# Patient Record
Sex: Female | Born: 1965 | Race: White | Hispanic: No | Marital: Married | Smoking: Never smoker
Health system: Southern US, Community
[De-identification: ages and names within clinical notes are randomized; demographics above are authoritative.]

## PROBLEM LIST (undated history)

## (undated) DIAGNOSIS — R7309 Other abnormal glucose: Secondary | ICD-10-CM

## (undated) DIAGNOSIS — R7989 Other specified abnormal findings of blood chemistry: Secondary | ICD-10-CM

## (undated) DIAGNOSIS — F329 Major depressive disorder, single episode, unspecified: Secondary | ICD-10-CM

## (undated) DIAGNOSIS — E785 Hyperlipidemia, unspecified: Secondary | ICD-10-CM

## (undated) DIAGNOSIS — T7840XA Allergy, unspecified, initial encounter: Secondary | ICD-10-CM

## (undated) DIAGNOSIS — N200 Calculus of kidney: Secondary | ICD-10-CM

## (undated) DIAGNOSIS — F419 Anxiety disorder, unspecified: Secondary | ICD-10-CM

## (undated) DIAGNOSIS — K219 Gastro-esophageal reflux disease without esophagitis: Secondary | ICD-10-CM

## (undated) DIAGNOSIS — F32A Depression, unspecified: Secondary | ICD-10-CM

## (undated) DIAGNOSIS — N210 Calculus in bladder: Secondary | ICD-10-CM

## (undated) DIAGNOSIS — L709 Acne, unspecified: Secondary | ICD-10-CM

## (undated) HISTORY — DX: Hyperlipidemia, unspecified: E78.5

## (undated) HISTORY — DX: Other specified abnormal findings of blood chemistry: R79.89

## (undated) HISTORY — DX: Calculus in bladder: N21.0

## (undated) HISTORY — PX: OTHER SURGICAL HISTORY: SHX169

## (undated) HISTORY — DX: Allergy, unspecified, initial encounter: T78.40XA

## (undated) HISTORY — DX: Acne, unspecified: L70.9

## (undated) HISTORY — DX: Other abnormal glucose: R73.09

## (undated) HISTORY — DX: Depression, unspecified: F32.A

## (undated) HISTORY — PX: CYST REMOVAL HAND: SHX6279

## (undated) HISTORY — DX: Anxiety disorder, unspecified: F41.9

## (undated) HISTORY — DX: Major depressive disorder, single episode, unspecified: F32.9

## (undated) HISTORY — PX: APPENDECTOMY: SHX54

## (undated) HISTORY — DX: Calculus of kidney: N20.0

---

## 1999-02-15 ENCOUNTER — Other Ambulatory Visit: Admission: RE | Admit: 1999-02-15 | Discharge: 1999-02-15 | Payer: Self-pay | Admitting: *Deleted

## 2001-02-20 ENCOUNTER — Other Ambulatory Visit: Admission: RE | Admit: 2001-02-20 | Discharge: 2001-02-20 | Payer: Self-pay | Admitting: *Deleted

## 2002-06-28 ENCOUNTER — Other Ambulatory Visit: Admission: RE | Admit: 2002-06-28 | Discharge: 2002-06-28 | Payer: Self-pay | Admitting: Family Medicine

## 2003-06-30 ENCOUNTER — Other Ambulatory Visit: Admission: RE | Admit: 2003-06-30 | Discharge: 2003-06-30 | Payer: Self-pay | Admitting: Family Medicine

## 2003-11-23 ENCOUNTER — Encounter (INDEPENDENT_AMBULATORY_CARE_PROVIDER_SITE_OTHER): Payer: Self-pay | Admitting: Internal Medicine

## 2003-11-23 ENCOUNTER — Other Ambulatory Visit: Admission: RE | Admit: 2003-11-23 | Discharge: 2003-11-23 | Payer: Self-pay | Admitting: Family Medicine

## 2004-06-22 ENCOUNTER — Emergency Department: Payer: Self-pay | Admitting: Emergency Medicine

## 2004-07-31 ENCOUNTER — Ambulatory Visit: Payer: Self-pay | Admitting: Family Medicine

## 2004-08-10 ENCOUNTER — Ambulatory Visit: Payer: Self-pay | Admitting: Family Medicine

## 2005-03-06 ENCOUNTER — Ambulatory Visit: Payer: Self-pay | Admitting: Family Medicine

## 2005-03-14 ENCOUNTER — Ambulatory Visit: Payer: Self-pay | Admitting: Family Medicine

## 2005-05-14 ENCOUNTER — Ambulatory Visit: Payer: Self-pay | Admitting: Family Medicine

## 2005-05-27 ENCOUNTER — Ambulatory Visit: Payer: Self-pay | Admitting: Family Medicine

## 2005-08-20 ENCOUNTER — Ambulatory Visit: Payer: Self-pay | Admitting: Obstetrics and Gynecology

## 2006-04-11 ENCOUNTER — Ambulatory Visit: Payer: Self-pay | Admitting: Family Medicine

## 2006-05-01 ENCOUNTER — Ambulatory Visit: Payer: Self-pay | Admitting: Family Medicine

## 2006-06-10 ENCOUNTER — Emergency Department (HOSPITAL_COMMUNITY): Admission: EM | Admit: 2006-06-10 | Discharge: 2006-06-10 | Payer: Self-pay | Admitting: *Deleted

## 2006-09-25 ENCOUNTER — Ambulatory Visit: Payer: Self-pay | Admitting: Obstetrics and Gynecology

## 2006-10-13 ENCOUNTER — Encounter (INDEPENDENT_AMBULATORY_CARE_PROVIDER_SITE_OTHER): Payer: Self-pay | Admitting: Internal Medicine

## 2006-10-13 DIAGNOSIS — L708 Other acne: Secondary | ICD-10-CM | POA: Insufficient documentation

## 2006-10-13 DIAGNOSIS — N39498 Other specified urinary incontinence: Secondary | ICD-10-CM | POA: Insufficient documentation

## 2006-10-13 DIAGNOSIS — O99810 Abnormal glucose complicating pregnancy: Secondary | ICD-10-CM | POA: Insufficient documentation

## 2006-10-13 DIAGNOSIS — F411 Generalized anxiety disorder: Secondary | ICD-10-CM | POA: Insufficient documentation

## 2006-10-13 DIAGNOSIS — J309 Allergic rhinitis, unspecified: Secondary | ICD-10-CM | POA: Insufficient documentation

## 2006-10-14 ENCOUNTER — Ambulatory Visit: Payer: Self-pay | Admitting: Family Medicine

## 2006-10-14 ENCOUNTER — Ambulatory Visit: Payer: Self-pay | Admitting: Gastroenterology

## 2006-10-14 DIAGNOSIS — E7800 Pure hypercholesterolemia, unspecified: Secondary | ICD-10-CM | POA: Insufficient documentation

## 2006-10-17 ENCOUNTER — Ambulatory Visit: Payer: Self-pay | Admitting: Family Medicine

## 2006-10-23 LAB — CONVERTED CEMR LAB
LDL Cholesterol: 127 mg/dL — ABNORMAL HIGH (ref 0–99)
Total CHOL/HDL Ratio: 4.7
Triglycerides: 77 mg/dL (ref 0–149)
VLDL: 15 mg/dL (ref 0–40)

## 2007-03-23 ENCOUNTER — Ambulatory Visit: Payer: Self-pay | Admitting: Family Medicine

## 2007-03-23 DIAGNOSIS — K219 Gastro-esophageal reflux disease without esophagitis: Secondary | ICD-10-CM | POA: Insufficient documentation

## 2007-05-07 DIAGNOSIS — C4490 Unspecified malignant neoplasm of skin, unspecified: Secondary | ICD-10-CM

## 2007-05-07 HISTORY — DX: Unspecified malignant neoplasm of skin, unspecified: C44.90

## 2007-06-12 ENCOUNTER — Ambulatory Visit: Payer: Self-pay | Admitting: Family Medicine

## 2007-06-12 LAB — CONVERTED CEMR LAB
HDL: 35.9 mg/dL — ABNORMAL LOW (ref >39.0)
Triglycerides: 102 mg/dL (ref 0–149)

## 2007-06-15 LAB — CONVERTED CEMR LAB
Cholesterol: 162 mg/dL (ref 0–200)
HDL: 35.9 mg/dL — ABNORMAL LOW (ref >39.0)
LDL Cholesterol: 106 mg/dL — ABNORMAL HIGH (ref 0–99)
Triglycerides: 102 mg/dL (ref 0–149)

## 2007-07-05 HISTORY — PX: BLADDER SURGERY: SHX569

## 2007-08-04 ENCOUNTER — Ambulatory Visit (HOSPITAL_BASED_OUTPATIENT_CLINIC_OR_DEPARTMENT_OTHER): Admission: RE | Admit: 2007-08-04 | Discharge: 2007-08-04 | Payer: Self-pay | Admitting: Urology

## 2007-08-04 HISTORY — PX: INCONTINENCE SURGERY: SHX676

## 2007-10-06 ENCOUNTER — Ambulatory Visit (HOSPITAL_COMMUNITY): Admission: RE | Admit: 2007-10-06 | Discharge: 2007-10-06 | Payer: Self-pay | Admitting: Obstetrics & Gynecology

## 2007-12-07 ENCOUNTER — Ambulatory Visit: Payer: Self-pay | Admitting: Family Medicine

## 2007-12-07 DIAGNOSIS — L570 Actinic keratosis: Secondary | ICD-10-CM | POA: Insufficient documentation

## 2007-12-07 LAB — CONVERTED CEMR LAB
Cholesterol, target level: 200 mg/dL
HDL goal, serum: 40 mg/dL

## 2007-12-08 LAB — CONVERTED CEMR LAB
HDL: 38 mg/dL — ABNORMAL LOW (ref >39.0)
LDL Cholesterol: 118 mg/dL — ABNORMAL HIGH (ref 0–99)
Total CHOL/HDL Ratio: 4.5
VLDL: 14 mg/dL (ref 0–40)

## 2007-12-28 ENCOUNTER — Telehealth (INDEPENDENT_AMBULATORY_CARE_PROVIDER_SITE_OTHER): Payer: Self-pay | Admitting: *Deleted

## 2008-06-16 ENCOUNTER — Ambulatory Visit: Payer: Self-pay | Admitting: Family Medicine

## 2008-06-17 ENCOUNTER — Ambulatory Visit: Payer: Self-pay | Admitting: Gastroenterology

## 2008-06-17 ENCOUNTER — Telehealth (INDEPENDENT_AMBULATORY_CARE_PROVIDER_SITE_OTHER): Payer: Self-pay | Admitting: *Deleted

## 2008-06-17 ENCOUNTER — Telehealth (INDEPENDENT_AMBULATORY_CARE_PROVIDER_SITE_OTHER): Payer: Self-pay | Admitting: Internal Medicine

## 2008-06-20 ENCOUNTER — Encounter (INDEPENDENT_AMBULATORY_CARE_PROVIDER_SITE_OTHER): Payer: Self-pay | Admitting: *Deleted

## 2008-06-21 ENCOUNTER — Telehealth: Payer: Self-pay | Admitting: Gastroenterology

## 2008-06-23 ENCOUNTER — Ambulatory Visit: Payer: Self-pay | Admitting: Gastroenterology

## 2008-06-23 ENCOUNTER — Ambulatory Visit (HOSPITAL_COMMUNITY): Admission: RE | Admit: 2008-06-23 | Discharge: 2008-06-23 | Payer: Self-pay | Admitting: Gastroenterology

## 2008-06-23 ENCOUNTER — Encounter: Payer: Self-pay | Admitting: Gastroenterology

## 2008-06-27 ENCOUNTER — Telehealth: Payer: Self-pay | Admitting: Gastroenterology

## 2008-06-27 ENCOUNTER — Ambulatory Visit: Payer: Self-pay | Admitting: Family Medicine

## 2008-06-29 ENCOUNTER — Encounter: Payer: Self-pay | Admitting: Gastroenterology

## 2008-06-29 ENCOUNTER — Telehealth (INDEPENDENT_AMBULATORY_CARE_PROVIDER_SITE_OTHER): Payer: Self-pay | Admitting: *Deleted

## 2008-06-29 LAB — CONVERTED CEMR LAB
Cholesterol: 171 mg/dL (ref 0–200)
HDL: 36.6 mg/dL — ABNORMAL LOW (ref >39.0)
Total CHOL/HDL Ratio: 4.7
Triglycerides: 63 mg/dL (ref 0–149)
VLDL: 13 mg/dL (ref 0–40)

## 2008-07-07 ENCOUNTER — Ambulatory Visit (HOSPITAL_BASED_OUTPATIENT_CLINIC_OR_DEPARTMENT_OTHER): Admission: RE | Admit: 2008-07-07 | Discharge: 2008-07-07 | Payer: Self-pay | Admitting: Urology

## 2008-07-07 HISTORY — PX: COLLAGEN INJECTION: SHX5354

## 2008-10-07 ENCOUNTER — Ambulatory Visit (HOSPITAL_COMMUNITY): Admission: RE | Admit: 2008-10-07 | Discharge: 2008-10-07 | Payer: Self-pay | Admitting: Obstetrics & Gynecology

## 2008-12-29 ENCOUNTER — Telehealth: Payer: Self-pay | Admitting: Family Medicine

## 2009-04-14 ENCOUNTER — Ambulatory Visit: Payer: Self-pay | Admitting: Family Medicine

## 2009-06-26 ENCOUNTER — Ambulatory Visit: Payer: Self-pay | Admitting: Family Medicine

## 2009-06-27 LAB — CONVERTED CEMR LAB
Albumin: 3.6 g/dL (ref 3.5–5.2)
BUN: 13 mg/dL (ref 6–23)
Basophils Absolute: 0 10*3/uL (ref 0.0–0.1)
Bilirubin, Direct: 0 mg/dL (ref 0.0–0.3)
CO2: 29 meq/L (ref 19–32)
Calcium: 9 mg/dL (ref 8.4–10.5)
Chloride: 112 meq/L (ref 96–112)
Eosinophils Relative: 1.7 % (ref 0.0–5.0)
Glucose, Bld: 94 mg/dL (ref 70–99)
Hemoglobin: 12.5 g/dL (ref 12.0–15.0)
Lymphocytes Relative: 24.2 % (ref 12.0–46.0)
Lymphs Abs: 1.5 10*3/uL (ref 0.7–4.0)
MCV: 95.2 fL (ref 78.0–100.0)
Monocytes Absolute: 0.5 10*3/uL (ref 0.1–1.0)
Monocytes Relative: 9 % (ref 3.0–12.0)
Neutro Abs: 3.9 10*3/uL (ref 1.4–7.7)
Neutrophils Relative %: 64.8 % (ref 43.0–77.0)
RBC: 3.89 M/uL (ref 3.87–5.11)
RDW: 11.8 % (ref 11.5–14.6)
Sodium: 144 meq/L (ref 135–145)
TSH: 0.74 microintl units/mL (ref 0.35–5.50)
Total Bilirubin: 0.3 mg/dL (ref 0.3–1.2)

## 2009-07-11 ENCOUNTER — Encounter (INDEPENDENT_AMBULATORY_CARE_PROVIDER_SITE_OTHER): Payer: Self-pay | Admitting: Obstetrics and Gynecology

## 2009-07-11 ENCOUNTER — Inpatient Hospital Stay (HOSPITAL_COMMUNITY): Admission: RE | Admit: 2009-07-11 | Discharge: 2009-07-12 | Payer: Self-pay | Admitting: Obstetrics and Gynecology

## 2009-07-11 HISTORY — PX: ABDOMINAL HYSTERECTOMY: SHX81

## 2009-09-29 ENCOUNTER — Encounter: Admission: RE | Admit: 2009-09-29 | Discharge: 2009-09-29 | Payer: Self-pay | Admitting: Obstetrics and Gynecology

## 2010-02-07 ENCOUNTER — Telehealth: Payer: Self-pay | Admitting: Family Medicine

## 2010-02-07 ENCOUNTER — Ambulatory Visit: Payer: Self-pay | Admitting: Family Medicine

## 2010-02-09 LAB — CONVERTED CEMR LAB
Albumin: 4.2 g/dL (ref 3.5–5.2)
BUN: 14 mg/dL (ref 6–23)
Basophils Absolute: 0 10*3/uL (ref 0.0–0.1)
Basophils Relative: 0.6 % (ref 0.0–3.0)
CO2: 26 meq/L (ref 19–32)
Chloride: 108 meq/L (ref 96–112)
Creatinine, Ser: 0.7 mg/dL (ref 0.4–1.2)
Eosinophils Absolute: 0.1 10*3/uL (ref 0.0–0.7)
Eosinophils Relative: 1.8 % (ref 0.0–5.0)
Glucose, Bld: 92 mg/dL (ref 70–99)
LDL Cholesterol: 123 mg/dL — ABNORMAL HIGH (ref 0–99)
Lymphocytes Relative: 23.3 % (ref 12.0–46.0)
Lymphs Abs: 1.6 10*3/uL (ref 0.7–4.0)
Monocytes Absolute: 0.6 10*3/uL (ref 0.1–1.0)
Monocytes Relative: 8.2 % (ref 3.0–12.0)
Neutro Abs: 4.6 10*3/uL (ref 1.4–7.7)
Neutrophils Relative %: 66.1 % (ref 43.0–77.0)
Potassium: 4.2 meq/L (ref 3.5–5.1)
RBC: 4.08 M/uL (ref 3.87–5.11)
RDW: 12.9 % (ref 11.5–14.6)
TSH: 0.5 microintl units/mL (ref 0.35–5.50)
Total Bilirubin: 0.6 mg/dL (ref 0.3–1.2)
Total CHOL/HDL Ratio: 4
Total Protein: 6.9 g/dL (ref 6.0–8.3)

## 2010-04-10 ENCOUNTER — Encounter: Payer: Self-pay | Admitting: Family Medicine

## 2010-05-14 ENCOUNTER — Ambulatory Visit
Admission: RE | Admit: 2010-05-14 | Discharge: 2010-05-14 | Payer: Self-pay | Source: Home / Self Care | Attending: Family Medicine | Admitting: Family Medicine

## 2010-06-07 NOTE — Letter (Signed)
Summary: [facility]   Imported By: Maryln Gottron 04/16/2010 13:40:28  _____________________________________________________________________  External Attachment:    Type:   Image     Comment:   External Document

## 2010-06-07 NOTE — Assessment & Plan Note (Signed)
Summary: FOLLOW UP   Vital Signs:  Patient profile:   45 year old female Height:      62.5 inches Weight:      131.75 pounds BMI:     23.80 Temp:     98.0 degrees F oral Pulse rate:   60 / minute Pulse rhythm:   regular BP sitting:   90 / 64  (left arm) Cuff size:   regular  Vitals Entered By: Linde Gillis CMA Duncan Dull) (June 26, 2009 8:36 AM) CC: follow-up visit, Back Pain   History of Present Illness: is having rectocele and bladder sling and hysterectomy -- nothing cancerous - just for bladder control plans to keep ovaries in if possible  Dr Shon Baton - silva -- is march 8th   is really uncomfortable and also bladder control issues  is ready to get it done   is a little tired in general  cannot exercise because of above  is keeping wt doen by cutting back on what she is eating   wants to have her blood work checked -- sugar, cholesterol , and thyroid since she is tired   is back on the prozac - that is working well  did have anxiety when she weaned off of it  is generally anxious -- with overall stressed around it  is not depressed at all  will be better when she can exercise   painful periods- on OC -for one month -- then will go off of them       Allergies: 1)  Cipro (Ciprofloxacin Hcl)  Past History:  Past Medical History: Last updated: 06/17/2008 Allergic rhinitis Anxiety hyperlipidemia AKs acne  family hx of DM   Family History: Last updated: 06/17/2008 several family members with DM 2 Family History of Heart Disease:   Social History: Last updated: 06/17/2008 Marital Status: Married Children: Son 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 Eyes:  Denies blurring and eye pain. CV:  Denies chest pain or discomfort and lightheadness. Resp:  Denies cough and pleuritic. GI:  Denies abdominal pain, bloody stools, and change in bowel habits. MS:  Denies joint pain, joint redness, and joint swelling. Derm:  Denies lesion(s), poor wound healing, and rash. Neuro:  Denies numbness and tingling. Psych:  Complains of anxiety; denies panic attacks. Endo:  Denies excessive thirst and excessive urination. Heme:  Denies abnormal bruising and bleeding.  Physical Exam  General:  Well-developed,well-nourished,in no acute distress; alert,appropriate and cooperative throughout examination Head:  normocephalic, atraumatic, and no abnormalities observed.   Eyes:  vision grossly intact, pupils equal, pupils round, and pupils reactive to light.  no conjunctival pallor, injection or icterus  Mouth:  pharynx pink and moist.   Neck:  supple with full rom and no masses or thyromegally, no JVD or carotid bruit  Lungs:  Normal respiratory effort, chest expands symmetrically. Lungs are clear to auscultation, no crackles or wheezes. Heart:  Normal rate and regular rhythm. S1 and S2 normal without gallop, murmur, click, rub or other extra sounds. Abdomen:  soft, non-tender, and normal bowel sounds.   Extremities:  No clubbing, cyanosis, edema, or deformity noted with normal full range of motion of all joints.   Neurologic:  sensation intact to light touch,  gait normal, and DTRs symmetrical and normal.   Skin:  Intact without suspicious lesions or rashes Cervical Nodes:  No lymphadenopathy noted Psych:  normal affect, talkative and pleasant    Impression & Recommendations:  Problem # 1:  FATIGUE (ICD-780.79) Assessment New suspect this is multifactorial with painful menses and upcoming surg/ inability to exercise and work stress lab today and update  disc good health habits  Orders: Venipuncture (09811) TLB-Lipid Panel  (80061-LIPID) TLB-BMP (Basic Metabolic Panel-BMET) (80048-METABOL) TLB-CBC Platelet - w/Differential (85025-CBCD) TLB-Hepatic/Liver Function Pnl (80076-HEPATIC) TLB-TSH (Thyroid Stimulating Hormone) (84443-TSH)  Problem # 2:  HYPERCHOLESTEROLEMIA, PURE (ICD-272.0) Assessment: Unchanged  check lipids today- suspect well controlled with good diet does avoid sat fats - reviewed  Orders: Venipuncture (91478) TLB-Lipid Panel (80061-LIPID) TLB-BMP (Basic Metabolic Panel-BMET) (80048-METABOL) TLB-CBC Platelet - w/Differential (85025-CBCD) TLB-Hepatic/Liver Function Pnl (80076-HEPATIC) TLB-TSH (Thyroid Stimulating Hormone) (84443-TSH)  Labs Reviewed: SGOT: 20 (12/07/2007)   SGPT: 15 (12/07/2007)  Lipid Goals: Chol Goal: 200 (12/07/2007)   HDL Goal: 40 (12/07/2007)   LDL Goal: 160 (12/07/2007)   TG Goal: 150 (12/07/2007)  Prior 10 Yr Risk Heart Disease: 2 % (12/07/2007)   HDL:36.6 (06/27/2008), 38.0 (12/07/2007)  LDL:122 (06/27/2008), 118 (12/07/2007)  Chol:171 (06/27/2008), 170 (12/07/2007)  Trig:63 (06/27/2008), 68 (12/07/2007)  Problem # 3:  FAMILY HISTORY OF DIABETES MELLITUS (ICD-V18.0) Assessment: Unchanged will check fasting glucose today pt does good job with low sugar diet and wt mt  Orders: Venipuncture (29562) TLB-Lipid Panel (80061-LIPID) TLB-BMP (Basic Metabolic Panel-BMET) (80048-METABOL) TLB-CBC Platelet - w/Differential (85025-CBCD) TLB-Hepatic/Liver Function Pnl (80076-HEPATIC) TLB-TSH (Thyroid Stimulating Hormone) (84443-TSH)  Problem # 4:  ANXIETY (ICD-300.00) Assessment: Unchanged  fairly stable  do not recommend weaning off prozac until surgery is over  did refil med  Her updated medication list for this problem includes:    Prozac Weekly 90 Mg Cpdr (Fluoxetine hcl) .Marland Kitchen... Take 2 by mouth weekly  Orders: Prescription Created Electronically ([phone])  Complete Medication List: 1)  Multivitamins Tabs (Multiple vitamin) .... Take one by mouth daily 2)   Prozac Weekly 90 Mg Cpdr (Fluoxetine hcl) .... Take 2 by mouth weekly  Patient Instructions: 1)  labs today for cholesterol and blood sugar and fatigue  2)  no change in medicines  3)  good luck with your surgery  4)  get back to exercise when you can  Prescriptions: PROZAC WEEKLY 90 MG CPDR (FLUOXETINE HCL) Take 2 by mouth weekly  #8 x 11   Entered and Authorized by:   Judith Part MD   Signed by:   Judith Part MD on 06/26/2009   Method used:   [facility]        [id number]       [address]       [place], [place]  [address]       Ph: [phone]       Fax: [phone]   RxID:   [id number]   Prior Medications: MULTIVITAMINS  TABS (MULTIPLE VITAMIN) Take one by mouth daily PROZAC WEEKLY 90 MG CPDR (FLUOXETINE HCL) Take 2 by mouth weekly Current Allergies: CIPRO (CIPROFLOXACIN HCL)

## 2010-06-07 NOTE — Assessment & Plan Note (Signed)
Summary: FOLLOW UP / LFW   Vital Signs:  Patient profile:   45 year old female Height:      62.5 inches Weight:      131.75 pounds BMI:     23.80 Temp:     98.2 degrees F oral Pulse rate:   64 / minute Pulse rhythm:   regular BP sitting:   90 / 62  (left arm) Cuff size:   regular  Vitals Entered By: Lewanda Rife LPN (May 14, 2010 9:00 AM) CC: three month f/u   History of Present Illness: here for f/u of anxiety last visit switched prozac to celexa  is doing well overall   the celexa has improved the anxiety  is feeling more like her normal self -- just needed a switch  is also coping better at work  has some good coworkers- had good support  good changes at work   has been to the urgent care with sinus problems  took a course of amox much better - but staying congested with post nasal drip  some headaches - pressure   wt is down 1 lb  concerned about chol wants to rev labs tries to avoid red meat  eats too much [place] food     Allergies: 1)  Cipro (Ciprofloxacin Hcl)  Past History:  Past Medical History: Last updated: 06/17/2008 Allergic rhinitis Anxiety hyperlipidemia AKs acne  family hx of DM   Past Surgical History: Last updated: 02/07/2010 Appendectomy  bladder surgery March 2009 bladder sling  partial hyst -- cystocele and rectocele 2nd bladder tach  Family History: Last updated: 06/17/2008 several family members with DM 2 Family History of Heart Disease:   Social History: Last updated: 06/17/2008 Marital Status: Married Children: Son 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 Eyes:  Denies blurring and eye irritation. CV:  Denies chest pain or discomfort and palpitations. Resp:  Denies cough and wheezing. GI:   Denies indigestion and nausea. MS:  Denies joint pain, joint redness, and joint swelling. Derm:  Denies itching, lesion(s), poor wound healing, and rash. Neuro:  Denies numbness and tingling. Psych:  Denies panic attacks and suicidal thoughts/plans; mood is better . Endo:  Denies cold intolerance, excessive thirst, excessive urination, and heat intolerance. Heme:  Denies abnormal bruising and bleeding.  Physical Exam  General:  Well-developed,well-nourished,in no acute distress; alert,appropriate and cooperative throughout examination Head:  normocephalic, atraumatic, and no abnormalities observed.  no sinus tenderness Eyes:  vision grossly intact, pupils equal, pupils round, pupils reactive to light, and no injection.   Ears:  R ear normal and L ear normal.   Nose:  nares boggy and pale Mouth:  pharynx pink and moist, no erythema, and no exudates.   Neck:  supple with full rom and no masses or thyromegally, no JVD or carotid bruit  Chest Wall:  No deformities, masses, or tenderness noted. Lungs:  Normal respiratory effort, chest expands symmetrically. Lungs are clear to auscultation, no crackles or wheezes. Extremities:  No clubbing, cyanosis, edema, or deformity noted with normal full range of motion of all joints.   Neurologic:  sensation intact to light touch, gait normal, and DTRs symmetrical and normal.   no tremor  Skin:  Intact without suspicious lesions or rashes Cervical Nodes:  No lymphadenopathy noted Psych:  normal affect, talkative  and pleasant    Impression & Recommendations:  Problem # 1:  ANXIETY (ICD-300.00) Assessment Improved this is improved with celexa and also better support at work  plan to continue this  update if changes  Her updated medication list for this problem includes:    Celexa 40 Mg Tabs (Citalopram hydrobromide) .Marland Kitchen... 1 by mouth once daily  Problem # 2:  HYPERCHOLESTEROLEMIA, PURE (ICD-272.0) Assessment: Deteriorated  reviewed her lipid  profile in detail  LDL up a bit / HDL down  no med needed at this time disc chol goals disc low sat fat diet  disc exercise 5 d per week will re check next fall  Labs Reviewed: SGOT: 20 (02/07/2010)   SGPT: 17 (02/07/2010)  Lipid Goals: Chol Goal: 200 (12/07/2007)   HDL Goal: 40 (12/07/2007)   LDL Goal: 160 (12/07/2007)   TG Goal: 150 (12/07/2007)  Prior 10 Yr Risk Heart Disease: 2 % (12/07/2007)   HDL:45.30 (02/07/2010), 53.40 (06/26/2009)  LDL:123 (02/07/2010), 110 (06/26/2009)  Chol:190 (02/07/2010), 176 (06/26/2009)  Trig:107.0 (02/07/2010), 64.0 (06/26/2009)  Problem # 3:  ALLERGIC RHINITIS (ICD-477.9) Assessment: Deteriorated  worse in winter ? poss dust/ mold exp  trial of flonase and update  Her updated medication list for this problem includes:    Flonase 50 Mcg/act Susp (Fluticasone propionate) .Marland Kitchen... 2 sprays in each nostril once daily  Discussed use of allergy medications and environmental measures.   Orders: Prescription Created Electronically ([phone])  Complete Medication List: 1)  Multivitamins Tabs (Multiple vitamin) .... Take one by mouth daily 2)  Celexa 40 Mg Tabs (Citalopram hydrobromide) .Marland Kitchen.. 1 by mouth once daily 3)  Flonase 50 Mcg/act Susp (Fluticasone propionate) .... 2 sprays in each nostril once daily  Patient Instructions: 1)  you can raise your HDL (good cholesterol) by increasing exercise and eating omega 3 fatty acid supplement like fish oil or flax seed oil over the counter 2)  you can lower LDL (bad cholesterol) by limiting saturated fats in diet like red meat, fried foods, egg yolks, fatty breakfast meats, high fat dairy products and shellfish  3)  continue the celexa- I'm glad it is helping  4)  It is important that you exercise reguarly at least 20 minutes 5 times a week. If you develop chest pain, have severe difficulty breathing, or feel very tired, stop exercising immediately and seek medical attention.  5)  start flonase for chronic  congestion - let me know if not improving  Prescriptions: FLONASE 50 MCG/ACT SUSP (FLUTICASONE PROPIONATE) 2 sprays in each nostril once daily  #1 mdi x 11   Entered and Authorized by:   Judith Part MD   Signed by:   Judith Part MD on 05/14/2010   Method used:   [facility]        [id number]       [address]       [place], [place]  [address]       Ph: [phone]       Fax: [phone]   RxID:   [id number]    Orders Added: 1)  Est. Patient Level IV [02725] 2)  Prescription Created Electronically [phone]    Current Allergies (reviewed today): CIPRO (CIPROFLOXACIN HCL)

## 2010-06-07 NOTE — Progress Notes (Signed)
Summary: prior Berkley Harvey is needed for lexapro  Phone Note From Pharmacy   [facility]  [address] [id number] Summary of Call: Prior Berkley Harvey is needed for lexapro- insurance prefers citalopram, gen prozac, sertraline, gen paxil.  Do you want to change, or I can get prior auth form. Initial call taken by: Lowella Petties CMA,  February 07, 2010 4:26 PM  Follow-up for Phone Call        wil go with the celexa-- let the pt know I'm ok with that px written on EMR for call in  Follow-up by: Judith Part MD,  February 07, 2010 5:08 PM  Additional Follow-up for Phone Call Additional follow up Details #1::        Medication Celexa  [facility] as instructed. Patient notified as instructed by telephone. Lewanda Rife LPN  February 07, 2010 5:32 PM

## 2010-06-07 NOTE — Assessment & Plan Note (Signed)
Summary: CPX/CLE   Vital Signs:  Patient profile:   45 year old female Height:      62.5 inches Weight:      132 pounds BMI:     23.84 Temp:     97.8 degrees F oral Pulse rate:   60 / minute Pulse rhythm:   regular BP sitting:   98 / 64  (left arm) Cuff size:   regular  Vitals Entered By: Lewanda Rife LPN (February 07, 2010 9:53 AM) CC: CPX GYN does pap and breast exam. 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    wt is up 1 lb with good bmi of 23  bp good at 98/64- baseline   lipids are well controlled with LDL 110 (imp from 120s and also HDL up to 53) that was in feb  wants labs in may  has been on prozac for 10 years weekly  is starting to get worse anx again- ruminates over things and gets fixated  is not exercising -- knows that is important for anx    other labs nl   gyn care--march  had hyst in march -- bladder was prolapsing and also rectocele 2nd bladder tack  mam - april was nl with a cyst  loves diet coke- too much caffiene    Td 05 flu shot - had today   Allergies: 1)  Cipro (Ciprofloxacin Hcl)  Past History:  Past Medical History: Last updated: 06/17/2008 Allergic rhinitis Anxiety hyperlipidemia AKs acne  family hx of DM   Family History: Last updated: 06/17/2008 several family members with DM 2 Family History of Heart Disease:   Social History: Last updated: 06/17/2008 Marital Status: Married Children: Son 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 Eyes:  Denies blurring and eye irritation. CV:  Denies chest pain or discomfort, lightheadness, and palpitations. Resp:  Denies cough, shortness of breath, and wheezing. GI:  Denies abdominal pain, bloody stools, change in bowel habits, and indigestion. GU:  Denies abnormal vaginal bleeding, discharge, and dysuria. MS:  Denies joint pain. Derm:  Denies itching, lesion(s), poor wound healing, and rash. Neuro:  Denies numbness and tingling. Endo:  Denies excessive thirst and excessive urination. Heme:  Denies abnormal bruising and bleeding.  Physical Exam  General:  Well-developed,well-nourished,in no acute distress; alert,appropriate and cooperative throughout examination Head:  normocephalic, atraumatic, and no abnormalities observed.   Eyes:  vision grossly intact, pupils equal, pupils round, and pupils reactive to light.  no conjunctival pallor, injection or icterus  Ears:  R ear normal and L ear normal.   Nose:  no nasal discharge.   Mouth:  pharynx pink and moist.   Neck:  supple with full rom and no masses or thyromegally, no JVD or carotid bruit  Chest Wall:  No deformities, masses, or tenderness noted. Lungs:  Normal respiratory effort, chest expands symmetrically. Lungs are clear to auscultation, no crackles or wheezes. Heart:  Normal rate and regular rhythm. S1 and S2 normal without gallop, murmur, click, rub or other extra sounds.  Abdomen:  Bowel sounds positive,abdomen soft and non-tender without masses, organomegaly or hernias noted. no renal bruit  Msk:  No deformity or scoliosis noted of thoracic or lumbar spine.  no acute joint changes  Pulses:  R and L carotid,radial,femoral,dorsalis pedis and posterior tibial pulses are full and equal bilaterally Extremities:  No clubbing, cyanosis, edema, or deformity noted with normal full range of motion of all joints.   Neurologic:  sensation intact to light touch, gait normal, and DTRs symmetrical and normal.   Skin:   Intact without suspicious lesions or rashes some lentigos  Cervical Nodes:  No lymphadenopathy noted Inguinal Nodes:  No significant adenopathy Psych:  slightly anxious , pleasant and with good eye contact    Impression & Recommendations:  Problem # 1:  HEALTH MAINTENANCE EXAM (ICD-V70.0) Assessment Comment Only reviewed health habits including diet, exercise and skin cancer prevention reviewed health maintenance list and family history lab today f/u shot today Orders: Venipuncture (04540) TLB-Lipid Panel (80061-LIPID) TLB-BMP (Basic Metabolic Panel-BMET) (80048-METABOL) TLB-CBC Platelet - w/Differential (85025-CBCD) TLB-Hepatic/Liver Function Pnl (80076-HEPATIC) TLB-TSH (Thyroid Stimulating Hormone) (84443-TSH)  Problem # 2:  HYPERCHOLESTEROLEMIA, PURE (ICD-272.0) Assessment: Unchanged  lipids have been well controlled with diet  re check today rev low sat fat diet  Orders: Venipuncture (98119) TLB-Lipid Panel (80061-LIPID) TLB-BMP (Basic Metabolic Panel-BMET) (80048-METABOL) TLB-CBC Platelet - w/Differential (85025-CBCD) TLB-Hepatic/Liver Function Pnl (80076-HEPATIC) TLB-TSH (Thyroid Stimulating Hormone) (84443-TSH)  Labs Reviewed: SGOT: 17 (06/26/2009)   SGPT: 15 (06/26/2009)  Lipid Goals: Chol Goal: 200 (12/07/2007)   HDL Goal: 40 (12/07/2007)   LDL Goal: 160 (12/07/2007)   TG Goal: 150 (12/07/2007)  Prior 10 Yr Risk Heart Disease: 2 % (12/07/2007)   HDL:53.40 (06/26/2009), 36.6 (06/27/2008)  LDL:110 (06/26/2009), 122 (06/27/2008)  Chol:176 (06/26/2009), 171 (06/27/2008)  Trig:64.0 (06/26/2009), 63 (06/27/2008)  Problem # 3:  ANXIETY (ICD-300.00) Assessment: Deteriorated will switch to lexapro or celexa  for worse anx and ? if prozac is not working as well after years wil make the change this wkend when next prozac dose is due aware to update if worse or if any side eff f/u 3 mo The following medications were removed from the medication list:    Prozac Weekly  90 Mg Cpdr (Fluoxetine hcl) .Marland Kitchen... Take 2 by mouth weekly Her updated medication list for this problem includes:    Celexa 40 Mg Tabs (Citalopram hydrobromide) .Marland Kitchen... 1 by mouth once daily  Complete Medication List: 1)  Multivitamins Tabs (Multiple vitamin) .... Take one by mouth daily 2)  Celexa 40 Mg Tabs (Citalopram hydrobromide) .Marland Kitchen.. 1 by mouth once daily  Other Orders: Admin 1st Vaccine (14782) Flu Vaccine 33yrs + (95621)  Patient Instructions: 1)  start lexapro daily (stop the prozac)  2)  update me if worse or any side effects  3)  follow up in 3 months to see how you are doing  4)  start regular exercise  5)  lab today 6)  flu shot today Prescriptions: CELEXA 40 MG TABS (CITALOPRAM HYDROBROMIDE) 1 by mouth once daily  #30 x 11   Entered and Authorized by:   Judith Part MD   Signed by:   Lewanda Rife LPN on 30/86/5784   Method used:   [facility] ...       [facility]  [address] [id number]       [address]       [place], [place]  [address]       Ph: [phone]       Fax:  [phone]   RxID:   [id number] LEXAPRO 20 MG TABS (ESCITALOPRAM OXALATE) 1 by mouth once daily in am  #30 x 11   Entered and Authorized by:   Judith Part MD   Signed by:   Judith Part MD on 02/07/2010   Method used:   [facility] to        [id number]       [address]       [place], [place]  [address]       Ph: [phone]       Fax: [phone]   RxID:   [id number]   Current Allergies (reviewed today): CIPRO (CIPROFLOXACIN HCL)  [facility]. D/C Lexapro.Patient notified as instructed by telephone. Lewanda Rife LPN  February 07, 2010 5:33 PM     Flu Vaccine Consent Questions     Do you have a history of severe allergic reactions to this vaccine? no    Any prior history of allergic reactions to egg and/or gelatin? no    Do you have a sensitivity to the preservative Thimersol? no    Do you have a  past history of Guillan-Barre Syndrome? no    Do you currently have an acute febrile illness? no    Have you ever had a severe reaction to latex? no    Vaccine information given and explained to patient? yes    Are you currently pregnant? no    Lot Number:[id number]   Exp Date:11/03/2010   Site Given  Left Deltoid IMflu Lewanda Rife LPN  February 07, 2010 9:59 AM   Preventive Care Screening  Mammogram:    Date:  08/04/2009    Results:  cyst(s) left   Pap Smear:    Date:  07/04/2009    Results:  normal

## 2010-07-11 ENCOUNTER — Encounter: Payer: Self-pay | Admitting: Family Medicine

## 2010-07-11 ENCOUNTER — Other Ambulatory Visit: Payer: Self-pay | Admitting: Family Medicine

## 2010-07-11 ENCOUNTER — Ambulatory Visit (INDEPENDENT_AMBULATORY_CARE_PROVIDER_SITE_OTHER): Payer: BC Managed Care – PPO | Admitting: Family Medicine

## 2010-07-11 ENCOUNTER — Ambulatory Visit (INDEPENDENT_AMBULATORY_CARE_PROVIDER_SITE_OTHER)
Admission: RE | Admit: 2010-07-11 | Discharge: 2010-07-11 | Disposition: A | Discharge status: Home / Self Care | Payer: BC Managed Care – PPO | Source: Ambulatory Visit | Attending: Family Medicine | Admitting: Family Medicine

## 2010-07-11 DIAGNOSIS — K209 Esophagitis, unspecified without bleeding: Secondary | ICD-10-CM

## 2010-07-11 DIAGNOSIS — K5900 Constipation, unspecified: Secondary | ICD-10-CM | POA: Insufficient documentation

## 2010-07-11 DIAGNOSIS — R1084 Generalized abdominal pain: Secondary | ICD-10-CM

## 2010-07-12 ENCOUNTER — Telehealth: Payer: Self-pay | Admitting: Family Medicine

## 2010-07-17 NOTE — Assessment & Plan Note (Signed)
Summary: IBS/CLE   BCBS   Vital Signs:  Patient profile:   45 year old female Weight:      133.75 pounds BMI:     24.16 Temp:     98.1 degrees F oral Pulse rate:   76 / minute Pulse rhythm:   regular BP sitting:   102 / 80  (left arm) Cuff size:   regular  Vitals Entered By: Selena Batten Dance CMA Duncan Dull) (July 11, 2010 12:24 PM) CC: ? IBS issues   History of Present Illness: here for IBS problems (she thinks) is having pain in her low abdomen with bloating  ? stress related was dx with IBS years ago   baseline bm every 3 days  is always a little constipated  now back and forth - between loose stool and constipation  lot of gas and bloating  most pain is right before she has bm   has not changed her diet  overall not the healthiest diet- not enough fruit and veg     pain is off and on worse after she eats and with anxiety (gets in knots with stress)  no n/v   is also having more indigestion  lots of tums  14 days of prilosec over the counter -- and that helped a lot     wt is up 2 lb  good bp  Allergies: 1)  Cipro (Ciprofloxacin Hcl)  Past History:  Past Medical History: Last updated: 06/17/2008 Allergic rhinitis Anxiety hyperlipidemia AKs acne  family hx of DM   Past Surgical History: Last updated: 02/07/2010 Appendectomy  bladder surgery March 2009 bladder sling  partial hyst -- cystocele and rectocele 2nd bladder tach  Family History: Last updated: 06/17/2008 several family members with DM 2 Family History of Heart Disease:   Social History: Last updated: 06/17/2008 Marital Status: Married Children: Son 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 Eyes:  Denies blurring and eye irritation. CV:  Denies chest pain or  discomfort, lightheadness, and palpitations. Resp:  Denies cough and wheezing. GI:  Complains of abdominal pain, change in bowel habits, constipation, gas, and indigestion; denies bloody stools, dark tarry stools, nausea, and vomiting. GU:  Denies dysuria and hematuria. MS:  Denies cramps and stiffness. Derm:  Denies itching, lesion(s), poor wound healing, and rash. Neuro:  Denies numbness and tingling. Endo:  Denies excessive thirst and excessive urination. Heme:  Denies abnormal bruising and bleeding.  Physical Exam  General:  Well-developed,well-nourished,in no acute distress; alert,appropriate and cooperative throughout examination Head:  normocephalic, atraumatic, and no abnormalities observed.   Eyes:  vision grossly intact, pupils equal, pupils round, and pupils reactive to light.  no conjunctival pallor, injection or icterus  Mouth:  pharynx pink and moist.   Neck:  supple with full rom and no masses or thyromegally, no JVD or carotid bruit  Chest Wall:  No deformities, masses, or tenderness noted. Lungs:  Normal respiratory effort, chest expands symmetrically. Lungs are clear to auscultation, no crackles or wheezes. Heart:  Normal rate and regular rhythm. S1 and S2 normal without gallop, murmur, click, rub or other extra sounds. Abdomen:  mild tenderness epigastric and periumbilical no rebound or gaurding soft, normal bowel sounds, no distention, no masses, no hepatomegaly, and no splenomegaly.   Extremities:  No clubbing, cyanosis, edema, or deformity noted with  normal full range of motion of all joints.   Neurologic:  sensation intact to light touch, gait normal, and DTRs symmetrical and normal.   Skin:  Intact without suspicious lesions or rashes Cervical Nodes:  No lymphadenopathy noted Inguinal Nodes:  No significant adenopathy Psych:  normal affect, talkative and pleasant    Impression & Recommendations:  Problem # 1:  CONSTIPATION (ICD-564.00) Assessment New chronic  - now intermittent with soft stool suspect IBS  x ray today ? if this is causing pains  trial of fiber/ water- citrucel also miralax daily for a week and report back Orders: T-Abdomen 2-view (74020TC)  Problem # 2:  ABDOMINAL PAIN, GENERALIZED (ICD-789.07) Assessment: New upper and lower suspect some from above also poss gastritis  trial of protonix and update abd film pending and then will make plan Orders: T-Abdomen 2-view (74020TC)  Problem # 3:  GERD (ICD-530.1) Assessment: Deteriorated  this is worse  added protonix daily  update in 1-2 weeks  disc better diet  stress related as well  Orders: Prescription Created Electronically [id number])  Complete Medication List: 1)  Multivitamins Tabs (Multiple vitamin) .... Take one by mouth daily 2)  Flonase 50 Mcg/act Susp (Fluticasone propionate) .... 2 sprays in each nostril once daily 3)  Celexa 40 Mg Tabs (Citalopram hydrobromide) .Marland Kitchen.. 1 by mouth once daily 4)  Protonix 40 Mg Tbec (Pantoprazole sodium) .Marland Kitchen.. 1 by mouth once daily  Patient Instructions: 1)  start citrucel once daily with water as directed  2)  start miralax once daily with water as directed - for 7 days  3)  than use miralax as needed  4)  start daily protonix in ams  5)  make effort for more fruits and vegetables 6)  x ray today  Prescriptions: PROTONIX 40 MG TBEC (PANTOPRAZOLE SODIUM) 1 by mouth once daily  #30 x 11   Entered and Authorized by:   Judith Part MD   Signed by:   Judith Part MD on 07/11/2010   Method used:   [facility]        [id number]       [address]       [place], [place]  [address]       Ph: [phone]       Fax: [phone]   RxID:   [id number]    Orders Added: 1)  T-Abdomen 2-view [74020TC] 2)  Prescription Created Electronically [[id number]] 3)  Est. Patient Level IV [66440]    Current Allergies (reviewed today): CIPRO (CIPROFLOXACIN HCL)

## 2010-07-17 NOTE — Progress Notes (Signed)
Summary: wants test results  Phone Note Call from Patient Call back at [phone]   Caller: Patient Call For: Judith Part MD Summary of Call: Pt is asking for test results from yesterday, please review and advise. Initial call taken by: Lowella Petties CMA, AAMA,  July 12, 2010 11:09 AM  Follow-up for Phone Call        Spoke with pt.Pt still has a slight pain in lower abdomen and she feels like it may be related to IBS. Pain level now is low. Pt started taking Metamucill, Citracal and Protonix today and also added fruit to her diet today. Pt said it is fine if she can get a report on abdominal xray on Friday. Dr Milinda Antis is not here tomorrow either and she said she will be traveling and not sure if she will have computer access or reception. Please advise. Lewanda Rife LPN  July 12, 6211 4:32 PM   Additional Follow-up for Phone Call Additional follow up Details #1::        may notify abd xray showing constipation so i'm glad that she is increasing fiber and miralax.  also showed possible L kidney stone, but her pain doesn ot sound like it is coming from kidney stone.  has she had in past?  I'd just recommend continue plan as set forth by Dr. Milinda Antis, encourage liberal water intake. Additional Follow-up by: Eustaquio Boyden  MD,  July 12, 2010 4:56 PM    Additional Follow-up for Phone Call Additional follow up Details #2::    Patient notified as instructed by telephone. Pt does not have hx of kidney stone. Pt will call back on Monday with update of how she feels, sooner if needed.Lewanda Rife LPN  July 12, 863 5:08 PM     I agree with above- I think constipation may be the problem but update if worse and update next week regardless Follow-up by: Judith Part MD,  July 13, 2010 6:31 AM

## 2010-07-30 LAB — CBC
HCT: 28.4 % — ABNORMAL LOW (ref 36.0–46.0)
Hemoglobin: 12.8 g/dL (ref 12.0–15.0)
MCV: 94.2 fL (ref 78.0–100.0)
Platelets: 191 10*3/uL (ref 150–400)
Platelets: 287 10*3/uL (ref 150–400)

## 2010-07-30 LAB — BASIC METABOLIC PANEL
BUN: 3 mg/dL — ABNORMAL LOW (ref 6–23)
CO2: 26 mEq/L (ref 19–32)
Calcium: 8.2 mg/dL — ABNORMAL LOW (ref 8.4–10.5)
Chloride: 106 mEq/L (ref 96–112)
GFR calc non Af Amer: >60 mL/min (ref >60)

## 2010-07-30 LAB — URINALYSIS, DIPSTICK ONLY
Bilirubin Urine: NEGATIVE
Ketones, ur: NEGATIVE mg/dL
Leukocytes, UA: NEGATIVE
Nitrite: NEGATIVE
Specific Gravity, Urine: >1.03 — ABNORMAL HIGH (ref 1.005–1.030)
Urobilinogen, UA: 0.2 mg/dL (ref 0.0–1.0)
pH: 5.5 (ref 5.0–8.0)

## 2010-07-30 LAB — COMPREHENSIVE METABOLIC PANEL
AST: 18 U/L (ref 0–37)
Albumin: 3.3 g/dL — ABNORMAL LOW (ref 3.5–5.2)
Alkaline Phosphatase: 57 U/L (ref 39–117)
Calcium: 8.9 mg/dL (ref 8.4–10.5)
Creatinine, Ser: 0.63 mg/dL (ref 0.4–1.2)
GFR calc non Af Amer: >60 mL/min (ref >60)
Glucose, Bld: 128 mg/dL — ABNORMAL HIGH (ref 70–99)
Potassium: 3.3 mEq/L — ABNORMAL LOW (ref 3.5–5.1)
Total Protein: 6.6 g/dL (ref 6.0–8.3)

## 2010-08-16 LAB — POCT HEMOGLOBIN-HEMACUE: Hemoglobin: 12.5 g/dL (ref 12.0–15.0)

## 2010-08-16 LAB — POCT PREGNANCY, URINE: Preg Test, Ur: NEGATIVE

## 2010-09-18 NOTE — Op Note (Signed)
Sheila [name], Sheila [name]                  ACCOUNT NO.:  [id number]   MEDICAL RECORD NO.:  [id number]          PATIENT TYPE:  AMB   LOCATION:  [facility]                         FACILITY:  [facility]   PHYSICIAN:  Martina Sinner, MD DATE OF BIRTH:  [birth year]   DATE OF PROCEDURE:  08/04/2007  DATE OF DISCHARGE:                               OPERATIVE REPORT   PREOPERATIVE DIAGNOSIS:  Stress urinary incontinence.   SURGERY:  Sling, cystourethropexy [facility]) plus cystoscopy.   SURGEON:  Martina Sinner, M.D.   ASSISTANT:  Delman Kitten.   ASSISTANT:  Ms. Catlyn Shipton has had a lifelong history of stress urinary  incontinence.  She consented to a sling.  She has urge and stress  incontinence and failed behavioral and medical therapy.   The patient was prepped and draped in usual fashion.  Extra care was  taken with leg positioning to minimize the risk of compartment syndrome,  neuropathy, and DVT.   I was surprised with the degree of prolapse under anesthesia.  Her  cervix descended approximately 5 cm.  The anterior cuff defect brought  her bladder down, though she did not have a significant central defect.  It was more of a mild trapdoor defect.  When I initially examined her  December 2008, she had grade 2 hypermobility of the bladder neck,  minimal cystocele, and rectocele.   Two 1-cm incisions were made 1.56 cm lateral to the midline in the  suprapubic area, one fingerbreadth above the symphysis pubis.   I was careful marking a 2-cm mid urethral incision.  I injected 7 mL of  a lidocaine/epinephrine mixture.  I made a deep incision so she would  have a nice closure of flap.  I dissected underneath the vaginal mucosa  to urethrovesical angle bilaterally.   With the bladder empty, I passed a SPARC needle on top of and then along  the back of symphysis pubis parallel to the midline onto the pulp of my  index finger bilaterally.   I then cystoscoped the patient.  There was no injury to the  bladder or  urethra.  There was no indentation of the bladder with wiggling of the  trocars.  I had a good look in the urethra, and there was no urethral  injury.   Her urethra appeared to be in my opinion a little bit thin when I would  palpate it with the medial aspect of my index finger when I was  palpating the urethrovesical angle.  The findings were difficult to  describe and possibly related to a congenital abnormality.  I did not  over dissecting in this area, and the findings were similar on both  sides.  I cystoscoped the urethra very carefully, and I used suprapubic  pressure and occluded the urethra, and there was no leaking of blue dye.  Though there was some generalized thinning, in no way did I feel that  she had a fistula.   There was little to no bleeding.  I was able to make certain that the  [facility] needles were quite lateral and the  sling actually was more of a U  than a V.  With the bladder emptied, I attached the SPARC sling to the  needles and brought them up through the retropubic space.  I tensioned  over the fat part of the mid-sized Kelly clamp using my usual technique.  There was __________  the blue dye to irrigate the sheath and remove the  sheaths.  There was hypermobility of the sling in the midline and no  spring back effect.  Once again, I was happy with the tension and  position in the mid urethra.   Irrigation was used for all incisions.  Sling was cut below the skin.  Suprapubic skin was closed with 4-0 Vicryl subcuticular followed by  Dermabond; 2-0 Vicryl running followed by 2 interrupted sutures were  used for the vaginal wall incision.  Catheter plug and a vaginal pack  was also used.   I will speak to Ms. Coover about her anatomic findings today.  I am  hoping this operation will reach her treatment goal.           ______________________________  Martina Sinner, MD  Electronically Signed     SAM/MEDQ  D:  08/04/2007  T:  08/04/2007   Job:  [id number]

## 2010-09-18 NOTE — Op Note (Signed)
NAMEWILBERTA, [name]                  ACCOUNT NO.:  [id number]   MEDICAL RECORD NO.:  [id number]          PATIENT TYPE:  AMB   LOCATION:  [facility]                         FACILITY:  [facility]   PHYSICIAN:  Martina Sinner, MD DATE OF BIRTH:  08-01-1965   DATE OF PROCEDURE:  07/07/2008  DATE OF DISCHARGE:                               OPERATIVE REPORT   PREOPERATIVE DIAGNOSIS:  Stress urinary incontinence.   POSTOPERATIVE DIAGNOSIS:  Stress urinary incontinence.   SURGERY:  Cystoscopy, transurethral collagen injection therapy.   [name] has mild stress incontinence following a sling.  She has  dramatically improved, but we were hoping to achieve dryness.   The patient was prepped and draped in the usual fashion.  Preoperative  antibiotics were given.  The ACMI scope was utilized.  The bladder  mucosa and trigone were normal.  There was no stitch, foreign body or  carcinoma.  She had some descensus of her urethral angle.  One could  appreciate a little bit of a ridge in the mid urethra and this may have  represented the mid urethral sling.  I injected collagen at 5 and 7  o'clock proximal to this ridge at the level of the bladder neck and I  was very pleased with the coaptation.  There was no extravasation.  A  red rubber catheter was inserted to drain the bladder.  The patient was  taken to recovery room.           ______________________________  Martina Sinner, MD  Electronically Signed     SAM/MEDQ  D:  07/07/2008  T:  07/07/2008  Job:  [id number]

## 2010-09-27 ENCOUNTER — Other Ambulatory Visit: Payer: Self-pay | Admitting: Obstetrics and Gynecology

## 2010-10-08 ENCOUNTER — Telehealth: Payer: Self-pay | Admitting: *Deleted

## 2010-10-08 NOTE — Telephone Encounter (Signed)
Prior auth is needed for pantoprazole, form is on your shelf. 

## 2010-10-08 NOTE — Telephone Encounter (Signed)
Form is done in IN box 

## 2010-10-09 ENCOUNTER — Telehealth: Payer: Self-pay

## 2010-10-09 NOTE — Telephone Encounter (Signed)
Completed form faxed to [phone] as instructed. Form put on shelf on Laurie's desk. Copy retained at my desk if needed. Copy sent to be scanned also.

## 2010-10-09 NOTE — Telephone Encounter (Signed)
Telephone encounter opened in error.

## 2010-10-10 ENCOUNTER — Telehealth: Payer: Self-pay | Admitting: *Deleted

## 2010-10-10 NOTE — Telephone Encounter (Signed)
Prior approval given for pantoprazole, advised pharmacy.  Approval letter placed on doctor's desk for signature and scanning.

## 2010-10-11 ENCOUNTER — Other Ambulatory Visit: Payer: Self-pay | Admitting: Obstetrics and Gynecology

## 2010-10-11 DIAGNOSIS — Z1231 Encounter for screening mammogram for malignant neoplasm of breast: Secondary | ICD-10-CM

## 2010-10-23 ENCOUNTER — Ambulatory Visit
Admission: RE | Admit: 2010-10-23 | Discharge: 2010-10-23 | Disposition: A | Discharge status: Home / Self Care | Payer: BC Managed Care – PPO | Source: Ambulatory Visit | Attending: Obstetrics and Gynecology | Admitting: Obstetrics and Gynecology

## 2010-10-23 DIAGNOSIS — Z1231 Encounter for screening mammogram for malignant neoplasm of breast: Secondary | ICD-10-CM

## 2011-01-28 LAB — CBC
HCT: 36.4
MCHC: 35.7
MCV: 89.6
Platelets: 279

## 2011-01-28 LAB — TYPE AND SCREEN: ABO/RH(D): O POS

## 2011-01-28 LAB — PROTIME-INR: Prothrombin Time: 12.6

## 2011-02-04 ENCOUNTER — Other Ambulatory Visit: Payer: Self-pay | Admitting: *Deleted

## 2011-02-04 MED ORDER — CITALOPRAM HYDROBROMIDE 40 MG PO TABS: 40.0000 mg | ORAL_TABLET | Freq: Every day | ORAL | Status: DC

## 2011-02-04 NOTE — Telephone Encounter (Signed)
Will refill electronically  

## 2011-02-04 NOTE — Telephone Encounter (Signed)
Received faxed refill request, please advise.  This medication was not on med list.

## 2011-03-12 ENCOUNTER — Other Ambulatory Visit: Payer: Self-pay | Admitting: Dermatology

## 2011-06-12 ENCOUNTER — Encounter: Payer: Self-pay | Admitting: Family Medicine

## 2011-06-12 ENCOUNTER — Ambulatory Visit (INDEPENDENT_AMBULATORY_CARE_PROVIDER_SITE_OTHER): Payer: BC Managed Care – PPO | Admitting: Family Medicine

## 2011-06-12 VITALS — BP 90/60 | HR 76 | Temp 97.9°F | Ht 62.5 in | Wt 135.8 lb

## 2011-06-12 DIAGNOSIS — J329 Chronic sinusitis, unspecified: Secondary | ICD-10-CM | POA: Insufficient documentation

## 2011-06-12 DIAGNOSIS — J069 Acute upper respiratory infection, unspecified: Secondary | ICD-10-CM

## 2011-06-12 DIAGNOSIS — J029 Acute pharyngitis, unspecified: Secondary | ICD-10-CM

## 2011-06-12 LAB — POCT RAPID STREP A (OFFICE): Rapid Strep A Screen: NEGATIVE

## 2011-06-12 MED ORDER — FLUTICASONE PROPIONATE 50 MCG/ACT NA SUSP: 2.0000 | Freq: Every day | NASAL | Status: DC

## 2011-06-12 NOTE — Assessment & Plan Note (Signed)
Started yesterday with cong and cough and sinus pain / no fever today and rapid strep neg and had flu shot Disc symptomatic care - see instructions on AVS  Handout given Update if not starting to improve in a week or if worsening   Pt is high risk for sinusitis -so disc red flags to watch for - purulent drainage , fever

## 2011-06-12 NOTE — Patient Instructions (Addendum)
I think you have a viral head and chest cold  Get rest and drink lots of fluids  mucinex DM for congestion and cough  Ibuprofen or aleve for fever/ aches/ congestion / and facial pain  Warm compresses to face and breathing steam- helps sinuses  Get back on flonase all the time In addition - start using saline nasal spray or netti pot to flush out sinuses  Update if not starting to improve in a week or if worsening

## 2011-06-12 NOTE — Progress Notes (Signed)
  Subjective:    Patient ID: Sheila [name], female    DOB: 1965/06/30, 46 y.o.   MRN: [id number]  HPI Thinks she has a sinus infection Started getting sick yesterday  A lot of pain and pressure under eyes/ bad sore throat and ears and headache No fever - but body is aching  Neg strep test  Has a deviated septum - and never gets good air through L nostril  Is very congested - and keeps sinus headache a lot  Not using flonase all year   Is coughing -  Non production   Lots of post nasal drip  Patient Active Problem List  Diagnoses  . HYPERCHOLESTEROLEMIA, PURE  . ANXIETY  . ALLERGIC RHINITIS  . GERD  . GESTATIONAL DIABETES  . ACTINIC KERATOSIS  . ACNE, CYSTIC  . STRESS INCONTINENCE  . CONSTIPATION  . ABDOMINAL PAIN, GENERALIZED  . Viral URI   Past Medical History  Diagnosis Date  . Allergy     allergic rhinitis  . Anxiety   . Hyperlipidemia   . Acne    Past Surgical History  Procedure Date  . Appendectomy   . Bladder surgery 03/09  . Incontinence surgery   . Abdominal hysterectomy     partial hyst cystocele and rectocele  . Bladder tack     2nd bladder surgery   History  Substance Use Topics  . Smoking status: Never Smoker   . Smokeless tobacco: Not on file  . Alcohol Use:    No family history on file. Allergies  Allergen Reactions  . Ciprofloxacin     REACTION: Sick   Current Outpatient Prescriptions on File Prior to Visit  Medication Sig Dispense Refill  . citalopram (CELEXA) 40 MG tablet Take 1 tablet (40 mg total) by mouth daily.  30 tablet  11      Review of Systems Review of Systems  Constitutional: Negative for fever and  unexpected weight change. pos for fatigue and malaise Eyes: Negative for pain and visual disturbance.  ENT pos for rhinorrhea/ cong/ st  Respiratory: Negative for sob or wheeze Cardiovascular: Negative for cp or palpitations    Gastrointestinal: Negative for nausea, diarrhea and constipation.  Genitourinary: Negative  for urgency and frequency.  Skin: Negative for pallor or rash   Neurological: Negative for weakness, light-headedness, numbness and headaches.  Hematological: Negative for adenopathy. Does not bruise/bleed easily.  Psychiatric/Behavioral: Negative for dysphoric mood. The patient is not nervous/anxious.          Objective:   Physical Exam  Constitutional: She appears well-developed and well-nourished. No distress.  HENT:  Head: Normocephalic and atraumatic.  Right Ear: External ear normal.  Left Ear: External ear normal.  Mouth/Throat: Oropharynx is clear and moist. No oropharyngeal exudate.       Nares are injected and congested  No sinus tenderness Throat-mild post injection (rapid strep neg) Post nasal drip  Eyes: Conjunctivae and EOM are normal. Pupils are equal, round, and reactive to light. Right eye exhibits no discharge. Left eye exhibits no discharge.  Neck: Normal range of motion. Neck supple.  Cardiovascular: Normal rate and regular rhythm.   Pulmonary/Chest: Effort normal and breath sounds normal. No respiratory distress. She has no wheezes. She has no rales.  Lymphadenopathy:    She has no cervical adenopathy.  Skin: Skin is warm and dry. No rash noted.  Psychiatric: She has a normal mood and affect.          Assessment & Plan:

## 2011-06-17 ENCOUNTER — Ambulatory Visit (INDEPENDENT_AMBULATORY_CARE_PROVIDER_SITE_OTHER): Payer: BC Managed Care – PPO | Admitting: Family Medicine

## 2011-06-17 ENCOUNTER — Encounter: Payer: Self-pay | Admitting: Family Medicine

## 2011-06-17 DIAGNOSIS — J329 Chronic sinusitis, unspecified: Secondary | ICD-10-CM

## 2011-06-17 MED ORDER — HYDROCODONE-HOMATROPINE 5-1.5 MG/5ML PO SYRP: 5.0000 mL | ORAL_SOLUTION | Freq: Three times a day (TID) | ORAL | Status: AC | PRN

## 2011-06-17 MED ORDER — AMOXICILLIN-POT CLAVULANATE 875-125 MG PO TABS: 1.0000 | ORAL_TABLET | Freq: Two times a day (BID) | ORAL | Status: AC

## 2011-06-17 NOTE — Progress Notes (Signed)
Prev HPI from 06/12/11---  Thinks she has a sinus infection  Started getting sick yesterday  A lot of pain and pressure under eyes/ bad sore throat and ears and headache  No fever - but body is aching  Neg strep test  Has a deviated septum - and never gets good air through L nostril  Is very congested - and keeps sinus headache a lot  Not using flonase all year  Is coughing - Non production  Lots of post nasal drip  Today's note: Now with inc in discharge, nasal and facial pain, eye discharge.  +ear pain, but no fevers.  No help with otc meds.  Cough with sputum.  Sweats and chills.    Meds, vitals, and allergies reviewed.   ROS: See HPI.  Otherwise, noncontributory.  GEN: nad, alert and oriented HEENT: mucous membranes moist, tm w/o erythema, nasal exam w/o erythema, clear discharge noted,  OP with cobblestoning, limbus sparring injection in R eye NECK: supple w/o LA CV: rrr.   PULM: ctab, no inc wob EXT: no edema SKIN: no acute rash

## 2011-06-17 NOTE — Patient Instructions (Addendum)
Out of school/work until facial pain and cough are improved.  Potentially contagious. Start the antibiotics today.  Drink plenty of fluids, take tylenol as needed, and use the flonase.  This should gradually improve.  Take care.  Let us know if you have other concerns.    Use the cough medicine as needed; it can make you drowsy.

## 2011-06-19 NOTE — Assessment & Plan Note (Signed)
Given duration and discharge, start augmentin and f/u prn.  Nontoxic, call back as needed.  Supportive care o/w.

## 2011-06-24 ENCOUNTER — Telehealth: Payer: Self-pay | Admitting: Family Medicine

## 2011-06-24 NOTE — Telephone Encounter (Signed)
That is fine with me if ok with Dr Para March - she is very nice

## 2011-06-25 NOTE — Telephone Encounter (Signed)
Fine with me, please make the change in EMR.

## 2011-07-17 ENCOUNTER — Other Ambulatory Visit: Payer: Self-pay | Admitting: Family Medicine

## 2011-07-18 NOTE — Telephone Encounter (Signed)
[facility] ref[facility] Protonix 40 mg #30 x 11.

## 2011-08-07 ENCOUNTER — Ambulatory Visit (INDEPENDENT_AMBULATORY_CARE_PROVIDER_SITE_OTHER): Payer: BC Managed Care – PPO | Admitting: Family Medicine

## 2011-08-07 ENCOUNTER — Encounter: Payer: Self-pay | Admitting: Family Medicine

## 2011-08-07 VITALS — BP 100/60 | HR 76 | Temp 98.5°F | Wt 133.5 lb

## 2011-08-07 DIAGNOSIS — J029 Acute pharyngitis, unspecified: Secondary | ICD-10-CM

## 2011-08-07 LAB — POCT RAPID STREP A (OFFICE): Rapid Strep A Screen: NEGATIVE

## 2011-08-07 NOTE — Assessment & Plan Note (Signed)
Benign exam, likely viral and nontoxic.  This is likely a cumulation of illnesses with associated fatigue.  Will check mono and CBC with physical later in the year.  I wouldn't change SSRI now.  If she were feeling better and rested, her mood may improve.  She'll f/u as needed for that and she agrees with plan.

## 2011-08-07 NOTE — Patient Instructions (Addendum)
I would get a physical this summer with labs ahead of time.  If there aren't slots on the schedule, see about getting a appointment this summer.   Get some rest and drink plenty of fluids.  We'll contact you with your lab report. Take care.

## 2011-08-07 NOTE — Progress Notes (Signed)
Seen last in 2/13.  She got better after the abx and then went back to work.  She got sick again and was seen at [facility].  Was given a 'mycin' abx for presumed bronchitis.  She got better but then got sick again.  She did know if she has mult illnesses or another, longer illness like mono.  She has mult sick exposures.   She now has ST and ear pain.  She has some sore lymph nodes.  No fevers, did have some cough.  No sputum.  She may be going through menopause with some hot flashes.    She's a school principle, "a Product/process development scientist and a perfectionist".  She doesn't have an Geophysicist/field seismologist at work, "and even if I did, I would probably still be doing everything."  She still has some anxiety, even with the celexa. We talked about this, but she has no SI/HI and the goal is to w/u the above illness then address if sx continue  She's due for a physical.  H/o GDM, no polyuria.    Meds, vitals, and allergies reviewed.   ROS: See HPI.  Otherwise, noncontributory.  RST neg.   GEN: nad, alert and oriented HEENT: mucous membranes moist, tm w/o erythema, nasal exam w/o erythema, no discharge noted,  OP with cobblestoning NECK: supple w/o LA CV: rrr.   PULM: ctab, no inc wob EXT: no edema SKIN: no acute rash ABD: benign

## 2011-08-08 LAB — CBC WITH DIFFERENTIAL/PLATELET
Basophils Relative: 0.4 % (ref 0.0–3.0)
Eosinophils Absolute: 0.1 10*3/uL (ref 0.0–0.7)
Eosinophils Relative: 1.4 % (ref 0.0–5.0)
Lymphocytes Relative: 17.3 % (ref 12.0–46.0)
MCHC: 33.4 g/dL (ref 30.0–36.0)
Monocytes Relative: 6.8 % (ref 3.0–12.0)
Neutrophils Relative %: 74.1 % (ref 43.0–77.0)
RBC: 4.03 Mil/uL (ref 3.87–5.11)
WBC: 9.3 10*3/uL (ref 4.5–10.5)

## 2011-08-08 LAB — MONONUCLEOSIS SCREEN: Mono Screen: NEGATIVE

## 2011-09-16 ENCOUNTER — Other Ambulatory Visit: Payer: Self-pay | Admitting: Otolaryngology

## 2011-09-16 DIAGNOSIS — J329 Chronic sinusitis, unspecified: Secondary | ICD-10-CM

## 2011-09-18 ENCOUNTER — Ambulatory Visit
Admission: RE | Admit: 2011-09-18 | Discharge: 2011-09-18 | Disposition: A | Discharge status: Home / Self Care | Payer: BC Managed Care – PPO | Source: Ambulatory Visit | Attending: Otolaryngology | Admitting: Otolaryngology

## 2011-09-18 DIAGNOSIS — J329 Chronic sinusitis, unspecified: Secondary | ICD-10-CM

## 2011-10-15 ENCOUNTER — Telehealth: Payer: Self-pay

## 2011-10-15 NOTE — Telephone Encounter (Signed)
[facility] Pant[facility]razole 40 mg. Spoke with Brandi at [phone] and was approved over the phone until 10/14/12. Case # [id number].spoke with Angelique Blonder at [employer] gave approval info. Angelique Blonder will notify pt when rx ready for pick up.

## 2011-10-21 ENCOUNTER — Other Ambulatory Visit: Payer: Self-pay | Admitting: Obstetrics and Gynecology

## 2011-10-21 DIAGNOSIS — Z1231 Encounter for screening mammogram for malignant neoplasm of breast: Secondary | ICD-10-CM

## 2011-11-05 ENCOUNTER — Ambulatory Visit
Admission: RE | Admit: 2011-11-05 | Discharge: 2011-11-05 | Disposition: A | Discharge status: Home / Self Care | Payer: BC Managed Care – PPO | Source: Ambulatory Visit | Attending: Obstetrics and Gynecology | Admitting: Obstetrics and Gynecology

## 2011-11-05 DIAGNOSIS — Z1231 Encounter for screening mammogram for malignant neoplasm of breast: Secondary | ICD-10-CM

## 2011-11-29 ENCOUNTER — Ambulatory Visit (INDEPENDENT_AMBULATORY_CARE_PROVIDER_SITE_OTHER): Payer: BC Managed Care – PPO | Admitting: Family Medicine

## 2011-11-29 ENCOUNTER — Encounter: Payer: Self-pay | Admitting: Family Medicine

## 2011-11-29 VITALS — BP 102/66 | HR 72 | Temp 98.2°F | Wt 137.0 lb

## 2011-11-29 DIAGNOSIS — F411 Generalized anxiety disorder: Secondary | ICD-10-CM

## 2011-11-29 MED ORDER — FLUOXETINE HCL 40 MG PO CAPS: 40.0000 mg | ORAL_CAPSULE | Freq: Every day | ORAL | Status: DC

## 2011-11-29 NOTE — Progress Notes (Signed)
She is a "tenderhearted and caring person" and she is internalizing concerns and worries.  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  There have been cuts to budgets and work as been tough.  Prev she was crying after seeing animal in cages at a pet store.  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    She is an introvert and wants not to be the center of attention.    She has supportive family.  Prev was on prozac.  She has been anxious, worried, depressed, tearful.  Sx have been worse in the last 6 months.  No Si/Hi.    Doesn't smoke, drink, or use drugs  Sleep: 10PM to 5:30AM Exercise: very little Caffeine: "a lot" - diet coke Counseling discussed.  She prev went through counseling at age ~49.  She does have supportive family and friends so she hadn't restarted counseling.    She may get moved to another job.  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   Meds, vitals, and allergies reviewed.   ROS: See HPI.  Otherwise, noncontributory.  GEN: nad, alert and oriented HEENT: mucous membranes moist NECK: supple w/o LA CV: rrr PULM: ctab, no inc wob SKIN: no acute rash Affect wnl except for brief tearfulness, speech and judgement wnl No tremor

## 2011-11-29 NOTE — Patient Instructions (Addendum)
Stop the citalopram and start taking prozac 40mg  a day. Try to walk each day with your cell phone off.  Try to taper your caffeine use.  Let me know if you aren't improving.

## 2011-12-01 NOTE — Assessment & Plan Note (Signed)
No SI/HI.  Okay for outpatient f/u.  D/w pt about not letting her disposition (as a caring person) overwhelm her and affect her mood/performance/job satisfaction.  Will change to prozac.  She'll cut back on caffeine, try to inc exercise and consider counseling.  She agrees with plan. >25 min spent with face to face with patient, >50% counseling and/or coordinating care.

## 2011-12-18 ENCOUNTER — Other Ambulatory Visit: Payer: BC Managed Care – PPO

## 2011-12-24 ENCOUNTER — Other Ambulatory Visit: Payer: Self-pay | Admitting: Family Medicine

## 2011-12-24 DIAGNOSIS — E7800 Pure hypercholesterolemia, unspecified: Secondary | ICD-10-CM

## 2011-12-27 ENCOUNTER — Other Ambulatory Visit: Payer: Self-pay | Admitting: Family Medicine

## 2011-12-27 ENCOUNTER — Other Ambulatory Visit (INDEPENDENT_AMBULATORY_CARE_PROVIDER_SITE_OTHER): Payer: BC Managed Care – PPO

## 2011-12-27 ENCOUNTER — Telehealth: Payer: Self-pay

## 2011-12-27 DIAGNOSIS — E7800 Pure hypercholesterolemia, unspecified: Secondary | ICD-10-CM

## 2011-12-27 LAB — COMPREHENSIVE METABOLIC PANEL
AST: 19 U/L (ref 0–37)
Albumin: 3.7 g/dL (ref 3.5–5.2)
BUN: 9 mg/dL (ref 6–23)
Calcium: 8.9 mg/dL (ref 8.4–10.5)
Chloride: 105 mEq/L (ref 96–112)
Potassium: 3.9 mEq/L (ref 3.5–5.1)

## 2011-12-27 LAB — LIPID PANEL
Total CHOL/HDL Ratio: 4
Triglycerides: 107 mg/dL (ref 0.0–149.0)

## 2011-12-27 NOTE — Telephone Encounter (Signed)
Pt had blood drawn this AM and wanted to verify Lipid test would be done. Tasha confirmed lipid done.Patient notified as instructed by telephone.

## 2011-12-27 NOTE — Addendum Note (Signed)
Addended by: Alvina Chou on: 12/27/2011 09:23 AM   Modules accepted: Orders

## 2012-01-07 ENCOUNTER — Ambulatory Visit (INDEPENDENT_AMBULATORY_CARE_PROVIDER_SITE_OTHER): Payer: BC Managed Care – PPO | Admitting: Family Medicine

## 2012-01-07 ENCOUNTER — Encounter: Payer: Self-pay | Admitting: Family Medicine

## 2012-01-07 VITALS — BP 100/64 | HR 64 | Temp 98.4°F | Ht 62.34 in | Wt 137.8 lb

## 2012-01-07 DIAGNOSIS — R739 Hyperglycemia, unspecified: Secondary | ICD-10-CM

## 2012-01-07 DIAGNOSIS — Z0000 Encounter for general adult medical examination without abnormal findings: Secondary | ICD-10-CM

## 2012-01-07 DIAGNOSIS — R7309 Other abnormal glucose: Secondary | ICD-10-CM

## 2012-01-07 DIAGNOSIS — F411 Generalized anxiety disorder: Secondary | ICD-10-CM

## 2012-01-07 NOTE — Patient Instructions (Addendum)
Recheck labs in 1 year before a physical.  Try to walk for exercise.  Don't change your meds.   Take care.  Glad to see you.

## 2012-01-07 NOTE — Progress Notes (Signed)
CPE- See plan.  Routine anticipatory guidance given to patient.  See health maintenance. Pap/pelvic/breast exam per Dr. Henderson Cloud.   Tetanus 2005 Flu shot done at work Colon cancer screening due at 49.   Advance directive.  Has a living will.    Mild hyperglycemia.  D/w pt about labs and exercise. We talked about walking at work.  This is feasible.  H/o GDM.   Mood is better.  Less anxious, "a little bit."  Less tearful.  Work is going well.  On prozac since 7/13, improved shortly thereafter.  Still with sig caffeine.  "Zero" exercise.  PMH and SH reviewed  Meds, vitals, and allergies reviewed.   ROS: See HPI.  Otherwise negative.    GEN: nad, alert and oriented, affect bright.  HEENT: mucous membranes moist NECK: supple w/o LA CV: rrr. PULM: ctab, no inc wob ABD: soft, +bs EXT: no edema SKIN: no acute rash

## 2012-01-08 DIAGNOSIS — R739 Hyperglycemia, unspecified: Secondary | ICD-10-CM | POA: Insufficient documentation

## 2012-01-08 NOTE — Assessment & Plan Note (Signed)
D/w pt about diet and exercise, recheck yearly.  She agrees.

## 2012-01-08 NOTE — Assessment & Plan Note (Signed)
Routine anticipatory guidance given to patient.  See health maintenance. Pap/pelvic/breast exam per Dr. Henderson Cloud.   Tetanus 2005 Flu shot done at work Colon cancer screening due at 49.   Advance directive.  Has a living will.

## 2012-01-08 NOTE — Assessment & Plan Note (Signed)
Much improved, continue current meds 

## 2012-01-09 ENCOUNTER — Encounter: Payer: BC Managed Care – PPO | Admitting: Family Medicine

## 2012-02-04 ENCOUNTER — Telehealth: Payer: Self-pay | Admitting: *Deleted

## 2012-02-04 NOTE — Telephone Encounter (Signed)
Faxed refill request from [facility] for citalopram, this is no longer on med list.  Please advise if ok to refill.  Request has last filled date of 11/09/11, # [phone].

## 2012-02-05 NOTE — Telephone Encounter (Signed)
Pharmacy notified as instructed by telephone. 

## 2012-02-05 NOTE — Telephone Encounter (Signed)
Deny this.  Was changed to prozac.  Thanks.

## 2012-03-24 ENCOUNTER — Encounter: Payer: Self-pay | Admitting: Family Medicine

## 2012-03-24 ENCOUNTER — Ambulatory Visit (INDEPENDENT_AMBULATORY_CARE_PROVIDER_SITE_OTHER): Payer: BC Managed Care – PPO | Admitting: Family Medicine

## 2012-03-24 VITALS — BP 110/70 | HR 78 | Temp 98.1°F | Wt 139.0 lb

## 2012-03-24 DIAGNOSIS — M549 Dorsalgia, unspecified: Secondary | ICD-10-CM

## 2012-03-24 MED ORDER — CYCLOBENZAPRINE HCL 10 MG PO TABS: 5.0000 mg | ORAL_TABLET | Freq: Every evening | ORAL | Status: DC | PRN

## 2012-03-24 MED ORDER — TRAMADOL HCL 50 MG PO TABS
50.0000 mg | ORAL_TABLET | Freq: Three times a day (TID) | ORAL | Status: DC | PRN
Start: 2012-03-24 — End: 2012-10-12

## 2012-03-24 NOTE — Progress Notes (Signed)
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 This will help her commute.    B lower back pain.  No help with ibuprofen, rest, heat.  Going on for ~10 days.  No injury.  No leg pain. No rash.  Present all the time but intermittently worse w/o clear trigger. Pain is in a horizontal line just able the waist line on her pants. Pain with leaning back but not forward flexion.  No FCNAVD.  No dysuria.  No blood in urine or stool.  No radiation to groin.    Meds, vitals, and allergies reviewed.   ROS: See HPI.  Otherwise, noncontributory.  GEN: nad, alert and oriented NECK: supple w/o LA CV: rrr.  PULM: ctab, no inc wob ABD: soft, +bs EXT: no edema SKIN: no acute rash Back w/o midline pain but pain on facet loading B and pain with paraspinal palpation.  SLR neg, SI testing neg, distally NV intact no rash

## 2012-03-24 NOTE — Patient Instructions (Addendum)
Take tramadol during the day and flexeril at night.  Both can make you drowsy but the tramadol is less likely to do so.   Stretch, use a heating pad, and this should get better.   Take care.

## 2012-03-25 NOTE — Assessment & Plan Note (Signed)
Nonspecific exam, benign exam.  D/w pt about stretching.  Ibuprofen isn't helping, would change to tramadol with flexeril, sedation caution on both.  She agrees.  No indication for imaging, f/u prn.  Likely muscle strain with facet irritation.

## 2012-07-29 ENCOUNTER — Other Ambulatory Visit: Payer: Self-pay | Admitting: Family Medicine

## 2012-09-28 ENCOUNTER — Other Ambulatory Visit: Payer: Self-pay | Admitting: Family Medicine

## 2012-09-29 ENCOUNTER — Other Ambulatory Visit: Payer: Self-pay | Admitting: *Deleted

## 2012-10-12 ENCOUNTER — Ambulatory Visit (INDEPENDENT_AMBULATORY_CARE_PROVIDER_SITE_OTHER): Payer: BC Managed Care – PPO | Admitting: Family Medicine

## 2012-10-12 VITALS — BP 120/76 | HR 78 | Temp 98.8°F | Resp 18 | Ht 62.0 in | Wt 136.0 lb

## 2012-10-12 DIAGNOSIS — F329 Major depressive disorder, single episode, unspecified: Secondary | ICD-10-CM

## 2012-10-12 DIAGNOSIS — J019 Acute sinusitis, unspecified: Secondary | ICD-10-CM

## 2012-10-12 DIAGNOSIS — F3289 Other specified depressive episodes: Secondary | ICD-10-CM

## 2012-10-12 DIAGNOSIS — F32A Depression, unspecified: Secondary | ICD-10-CM

## 2012-10-12 DIAGNOSIS — E7800 Pure hypercholesterolemia, unspecified: Secondary | ICD-10-CM

## 2012-10-12 DIAGNOSIS — J069 Acute upper respiratory infection, unspecified: Secondary | ICD-10-CM

## 2012-10-12 DIAGNOSIS — K219 Gastro-esophageal reflux disease without esophagitis: Secondary | ICD-10-CM

## 2012-10-12 DIAGNOSIS — F411 Generalized anxiety disorder: Secondary | ICD-10-CM

## 2012-10-12 MED ORDER — AMOXICILLIN-POT CLAVULANATE 875-125 MG PO TABS: 1.0000 | ORAL_TABLET | Freq: Two times a day (BID) | ORAL | Status: DC

## 2012-10-12 MED ORDER — VENLAFAXINE HCL ER 37.5 MG PO CP24: 37.5000 mg | ORAL_CAPSULE | Freq: Every day | ORAL | Status: DC

## 2012-10-12 MED ORDER — FLUOXETINE HCL 20 MG PO TABS: 20.0000 mg | ORAL_TABLET | Freq: Every day | ORAL | Status: DC

## 2012-10-12 NOTE — Patient Instructions (Addendum)
Take 40mg  prozac capsule x 1 week, then decrease  To  20mg  tablet daily x 1 week, then decrease to 10mg  x 1 week  Start effexor 37.5mg  daily x 2 weeks, then increase to 2 tablets daily Take antibiotics as prescribed Given shot of DepoMedrol- steroid to help with inflammation  F/U 4 weeks

## 2012-10-13 ENCOUNTER — Encounter: Payer: Self-pay | Admitting: Family Medicine

## 2012-10-13 DIAGNOSIS — F329 Major depressive disorder, single episode, unspecified: Secondary | ICD-10-CM | POA: Insufficient documentation

## 2012-10-13 DIAGNOSIS — J069 Acute upper respiratory infection, unspecified: Secondary | ICD-10-CM | POA: Insufficient documentation

## 2012-10-13 DIAGNOSIS — F32A Depression, unspecified: Secondary | ICD-10-CM | POA: Insufficient documentation

## 2012-10-13 DIAGNOSIS — J019 Acute sinusitis, unspecified: Secondary | ICD-10-CM | POA: Insufficient documentation

## 2012-10-13 NOTE — Assessment & Plan Note (Signed)
>   45 minutes spent with pt with > 50% on counseling for anxieyt/depression and medications for treatment Plan to taper off prozac and start effexor ,see pt instructions  Start 37.5mg  daily- effexor

## 2012-10-13 NOTE — Assessment & Plan Note (Signed)
Well controlled with PPI

## 2012-10-13 NOTE — Assessment & Plan Note (Signed)
-   Treat per above 

## 2012-10-13 NOTE — Assessment & Plan Note (Signed)
Per above, highly stressful occupation as a principal, long standing history of depression/anxiety Change to effexor, slowly taper to avoid withdrawal effects from long term use of prozac

## 2012-10-13 NOTE — Progress Notes (Signed)
  Subjective:    Patient ID: Sheila [name], female    DOB: 01/27/1966, 47 y.o.   MRN: [id number]  HPI Patient here to establish care. Previous PCP low-power at [facility]. [facility]-Dr. Suszanne Conners Patient presents for sick visit. She's complaint of sinus pressure and drainage which she has a history of. She's been followed by ear nose and throat she often gets severe sinusitis twice a year. This is likely typical symptoms. She's been using Sudafed as well as Tylenol for the sinus headache. She is drainage down the back of her throat and ears. Ears popping as well as sore throat. She also has had a dry cough. She's had many positive sick contacts with people from work.  She also has a long-standing history of depression and anxiety. She was started on medications at the age of 35. She's been on Prozac for some time she has tried Zoloft however this did not help. She does not feel her medications are helping. She has seen psychiatry in therapy in the past but not benefit from either. She finds herself crying more than usual and will have spells where she is down very anxious over the little things. She describes crying at during commercials and visiting this zoo and  though the animals were lonely and therefore became hysterical and start crying. She would like to try a new antidepressant/anxiolytic.  Medications and history reviewed  Review of Systems - per above   GEN- + fatigue, fever, weight loss,weakness, recent illness HEENT- denies eye drainage, change in vision, +nasal discharge, CVS- denies chest pain, palpitations RESP- denies SOB, +cough, wheeze ABD- denies N/V, change in stools, abd pain GU- denies dysuria, hematuria, dribbling, incontinence MSK- denies joint pain, muscle aches, injury Neuro- denies headache, dizziness, syncope, seizure activity Psych- +depression/anxiety      Objective:   Physical Exam  GEN- NAD, alert and oriented x3 HEENT- PERRL, EOMI, non injected  sclera, pink conjunctiva, MMM, oropharynx clear, TM clear bilat no effusion, + maxillary sinus tenderness, inflammed turbinates, + clear Nasal drainage  Neck- Supple, few shotty anterior nodes  CVS- RRR, no murmur RESP-CTAB EXT- No edema Pulses- Radial 2+ Psych- anxious appearing, not overly depressed, no SI, no hallucinations, normal thought process,        Assessment & Plan:

## 2012-10-13 NOTE — Assessment & Plan Note (Signed)
Reviewed last FLP, within normal limits

## 2012-10-13 NOTE — Assessment & Plan Note (Signed)
She did not tolerate oral steroids last given by ENT, given shot of Depo Medrol for the inflammation, antibitotics, nasal saline, mucinex

## 2012-11-02 ENCOUNTER — Telehealth: Payer: Self-pay | Admitting: Family Medicine

## 2012-11-02 MED ORDER — VENLAFAXINE HCL ER 75 MG PO CP24: 75.0000 mg | ORAL_CAPSULE | Freq: Every day | ORAL | Status: DC

## 2012-11-02 NOTE — Telephone Encounter (Signed)
Pt is out of effexor and wants to know if you can refill her again until she sees you in July 14th, pt states she is taking the medication as prescribed but feels she is short on her meds, wants to know if you can refill for a new RX?

## 2012-11-02 NOTE — Telephone Encounter (Signed)
Discussed medication she is taking correctly. She now be on 75 mg therefore she would only had a 3-1/2 weeks supply new dose 75 mg XL given, states she is going well on the medication

## 2012-11-04 ENCOUNTER — Other Ambulatory Visit: Payer: Self-pay | Admitting: Family Medicine

## 2012-11-16 ENCOUNTER — Ambulatory Visit (INDEPENDENT_AMBULATORY_CARE_PROVIDER_SITE_OTHER): Payer: BC Managed Care – PPO | Admitting: Family Medicine

## 2012-11-16 VITALS — BP 100/78 | HR 68 | Temp 98.0°F | Resp 14 | Wt 135.0 lb

## 2012-11-16 DIAGNOSIS — F3289 Other specified depressive episodes: Secondary | ICD-10-CM

## 2012-11-16 DIAGNOSIS — F329 Major depressive disorder, single episode, unspecified: Secondary | ICD-10-CM

## 2012-11-16 DIAGNOSIS — F411 Generalized anxiety disorder: Secondary | ICD-10-CM

## 2012-11-16 DIAGNOSIS — F32A Depression, unspecified: Secondary | ICD-10-CM

## 2012-11-16 NOTE — Progress Notes (Signed)
  Subjective:    Patient ID: Sheila [name], female    DOB: 22-Dec-1965, 47 y.o.   MRN: [id number]  HPI  Patient here to followup anxiety and depression. She was started on Effexor she's not titrated up to 75 mg daily and is doing very well on the medication. She does have bouts of insomnia which she's had over the past year or 2. She typically will take catnaps a fall asleep after reading the feels less stressed out when she's sleeping has not been bothered her. She finds herself tossing and turning at nighttime and often gets up to date on her computer. She has started exercising 20-30 minutes a day which helps some. She was concerned with perimenopause and was keeping her up at nighttime.  Review of Systems - per above Psych+stress and insomnia     Objective:   Physical Exam  GEN-NAD,alert and oriented x 3 Psych- normal affect and mood, not overly anxious or depressed appearing, no SI, normal speech and though process PHQ 9-4 GAD 7- 3      Assessment & Plan:

## 2012-11-16 NOTE — Patient Instructions (Addendum)
Continue effexor once a day  Continue all other medications Try the melatonin over the counter  Work on the exercise and decrease daytime naps  F/U 3  Physical   --- Fasting labs before appointment Lab

## 2012-11-17 ENCOUNTER — Encounter: Payer: Self-pay | Admitting: Family Medicine

## 2012-11-17 NOTE — Assessment & Plan Note (Signed)
Her scores are much improved both subjectively and objectively. We will continue her on the current dose of Effexor. At this time I have advised her to increase her exercise and to stop the daily to help with her insomnia at nighttime. We will not add a sleeping agent and she agrees. Approximately 20 minutes but with patient greater than 50% of the time counseling for depression and anxiety as well as medications

## 2012-11-17 NOTE — Assessment & Plan Note (Signed)
Per above symptoms much improved we'll continue the Effexor

## 2012-12-08 ENCOUNTER — Other Ambulatory Visit: Payer: Self-pay

## 2012-12-08 DIAGNOSIS — Z1231 Encounter for screening mammogram for malignant neoplasm of breast: Secondary | ICD-10-CM

## 2012-12-23 ENCOUNTER — Ambulatory Visit
Admission: RE | Admit: 2012-12-23 | Discharge: 2012-12-23 | Disposition: A | Discharge status: Home / Self Care | Payer: BC Managed Care – PPO | Source: Ambulatory Visit

## 2012-12-23 DIAGNOSIS — Z1231 Encounter for screening mammogram for malignant neoplasm of breast: Secondary | ICD-10-CM

## 2013-01-05 ENCOUNTER — Other Ambulatory Visit: Payer: Self-pay | Admitting: *Deleted

## 2013-01-05 MED ORDER — PANTOPRAZOLE SODIUM 40 MG PO TBEC: DELAYED_RELEASE_TABLET | ORAL | Status: DC

## 2013-01-11 ENCOUNTER — Ambulatory Visit (INDEPENDENT_AMBULATORY_CARE_PROVIDER_SITE_OTHER): Payer: BC Managed Care – PPO | Admitting: Physician Assistant

## 2013-01-11 ENCOUNTER — Encounter: Payer: Self-pay | Admitting: Physician Assistant

## 2013-01-11 VITALS — BP 100/80 | HR 72 | Temp 97.2°F | Resp 18 | Wt 136.0 lb

## 2013-01-11 DIAGNOSIS — F329 Major depressive disorder, single episode, unspecified: Secondary | ICD-10-CM

## 2013-01-11 DIAGNOSIS — F411 Generalized anxiety disorder: Secondary | ICD-10-CM

## 2013-01-11 DIAGNOSIS — R42 Dizziness and giddiness: Secondary | ICD-10-CM

## 2013-01-11 DIAGNOSIS — E7800 Pure hypercholesterolemia, unspecified: Secondary | ICD-10-CM

## 2013-01-11 DIAGNOSIS — F32A Depression, unspecified: Secondary | ICD-10-CM

## 2013-01-11 DIAGNOSIS — Z23 Encounter for immunization: Secondary | ICD-10-CM

## 2013-01-11 LAB — COMPLETE METABOLIC PANEL WITH GFR
Albumin: 4.3 g/dL (ref 3.5–5.2)
BUN: 12 mg/dL (ref 6–23)
Calcium: 9.4 mg/dL (ref 8.4–10.5)
Chloride: 107 mEq/L (ref 96–112)
GFR, Est Non African American: >89 mL/min
Glucose, Bld: 94 mg/dL (ref 70–99)
Potassium: 4.2 mEq/L (ref 3.5–5.3)

## 2013-01-11 LAB — CBC WITH DIFFERENTIAL/PLATELET
HCT: 38 % (ref 36.0–46.0)
Hemoglobin: 12.7 g/dL (ref 12.0–15.0)
Lymphocytes Relative: 27 % (ref 12–46)
Lymphs Abs: 1.7 10*3/uL (ref 0.7–4.0)
Monocytes Absolute: 0.5 10*3/uL (ref 0.1–1.0)
Monocytes Relative: 9 % (ref 3–12)
Neutro Abs: 3.9 10*3/uL (ref 1.7–7.7)
WBC: 6.2 10*3/uL (ref 4.0–10.5)

## 2013-01-11 LAB — LIPID PANEL
Cholesterol: 201 mg/dL — ABNORMAL HIGH (ref 0–200)
VLDL: 20 mg/dL (ref 0–40)

## 2013-01-11 LAB — TSH: TSH: 0.752 u[IU]/mL (ref 0.350–4.500)

## 2013-01-11 NOTE — Progress Notes (Signed)
Patient ID: RENLEE FLOOR MRN: [id number], DOB: [birth year], 47 y.o. Date of Encounter: @DATE @  Chief Complaint:  Chief Complaint  Patient presents with  . dizziness behind eyes  . Nausea  . nose bleeds    HPI: 47 y.o. year old white female  presents with multiple complaints.  She has had 3 nosebleeds recently. She wasn't sure if this is secondary to any underlying problem such as problems with her blood pressure etc.  For the past month off and on she has had episodes of feeling lightheaded. Sometimes when she feels this lightheadedness she also feels nauseous. However she never has plain nausea alone. She only feels the nausea when she is lightheaded. She has never felt as if she's about to pass out. Np presyncope and no frank syncope. She mostly notices this lightheaded sensation when she stands up quickly.  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 She reports high stress with this. She works 12-14 hour days often. She admits that she has a very poor diet and often  just eats some nabs and diet Coke here and there throughout the  day and never really eats adequately. As well she has noticed that these spells of lightheadedness have occurred mostly when she is tired on she's been working a lot.  Also noted that the Effexor was just started in June.   Past Medical History  Diagnosis Date  . Allergy     allergic rhinitis  . Anxiety   . Hyperlipidemia   . Acne   . Depression      Home Meds: See attached medication section for current medication list. Any medications entered into computer today will not appear on this note's list. The medications listed below were entered prior to today. Current Outpatient Prescriptions on File Prior to Visit  Medication Sig Dispense Refill  . Multiple Vitamin (MULTIVITAMIN) tablet Take 1 tablet by mouth daily.      . pantoprazole (PROTONIX) 40 MG tablet TAKE 1 TABLET BY MOUTH EVERY DAY  30 tablet  6  . venlafaxine XR (EFFEXOR  XR) 75 MG 24 hr capsule Take 1 capsule (75 mg total) by mouth daily.  30 capsule  3  . fluticasone (FLONASE) 50 MCG/ACT nasal spray Place 2 sprays into the nose daily.  16 g  11   No current facility-administered medications on file prior to visit.    Allergies:  Allergies  Allergen Reactions  . Ciprofloxacin     nauseated    History   Social History  . Marital Status: Married    Spouse Name: N/A    Number of Children: N/A  . Years of Education: N/A   Occupational History  . Not on file.   Social History Main Topics  . Smoking status: Never Smoker   . Smokeless tobacco: Never Used  . Alcohol Use: No  . Drug Use: No  . Sexual Activity: Not on file   Other Topics Concern  . Not on file   Social History Narrative   Principal [employer]   2 kids   Married 1989    Family History  Problem Relation Age of Onset  . Diabetes Father   . Breast cancer Neg Hx   . Colon cancer Neg Hx      Review of Systems:  See HPI for pertinent ROS. All other ROS negative.    Physical Exam: Blood pressure 100/80, pulse 72, temperature 97.2 F (36.2 C), temperature source Oral, resp.  rate 18, weight 136 lb (61.689 kg)., Body mass index is 24.87 kg/(m^2). General: Well-nourished well-developed white female . Appears in no acute distress. Head: Nares Normal with no mass, erythema, excoriation bleeding.  Neck: Supple. No thyromegaly. No lymphadenopathy. Lungs: Clear bilaterally to auscultation without wheezes, rales, or rhonchi. Breathing is unlabored. Heart: RRR with S1 S2. No murmurs, rubs, or gallops. Musculoskeletal:  Strength and tone normal for age. Extremities/Skin: Warm and dry. No clubbing or cyanosis. No edema. No rashes or suspicious lesions. Neuro: Alert and oriented X 3. Moves all extremities spontaneously. Gait is normal. CNII-XII grossly in tact. Psych:  Responds to questions appropriately with a normal affect.     ASSESSMENT AND PLAN:  47 y.o. year old  female with  1. ANXIETY        I explained to her that given her low blood pressure plus the Effexor at the Effexor could be contributing to her symptoms. However I definitely think she should continue the                             Effexor. 2. Depression   3. HYPERCHOLESTEROLEMIA, PURE She states she has a visit scheduled with Dr. Jeanice Lim in one month for a complete physical exam. She wants to go ahead and check her lipid profile now. She is fasting. - Lipid panel  4. Lightheadedness I reassured her that I really think her lightheadedness is mostly secondary to her low blood pressure. We do not need to give her any medication to raise her blood pressure. Instead she simply needs to be aware that her blood pressure is low and it can cause lightheadedness. She needs to make sure to stand up slowly. As well she needs to be aware that she is on the Effexor and the combination of low blood pressure and Effexor may be contributing as well. Also she needs to try to manage her stress and make sure she is eating regularly. I think that the fatigue and probable times of relative hypoglycemia are probably also contributing in addition to the above. - CBC with Differential - COMPLETE METABOLIC PANEL WITH GFR - TSH  #5 epistaxis: Discussed with her the cause of this. I discussed with her that I do not think she has an underlying medical condition to explain this. She has no other unusual bleeding or problems with clotting. She's never had menometrorrhagia. She has never had any other abnormal bleeding. On her last CBC her platelet count was normal. I discussed with her making sure that her nares remain moisturized. Avoid blowing really hard and she does need to blow her nose. Avoid picking at the nose. Discussed the anatomy of the blood vessels in the nose and the fact that sometimes he will have to have cauterization  5. Need for prophylactic vaccination and inoculation against influenza She wants to go ahead  and do a flu vaccine while she is here. - Flu Vaccine QUAD 36+ mos PF IM (Fluarix)  She already has an appointment to see Dr. Jeanice Lim in one month for a complete physical. She will follow up with her at that time.  [address] [place], [place], [facility] 01/11/2013 11:24 AM

## 2013-01-26 ENCOUNTER — Ambulatory Visit (INDEPENDENT_AMBULATORY_CARE_PROVIDER_SITE_OTHER): Payer: BC Managed Care – PPO | Admitting: Family Medicine

## 2013-01-26 VITALS — BP 104/70 | HR 80 | Temp 97.4°F | Resp 18 | Wt 141.0 lb

## 2013-01-26 DIAGNOSIS — F3289 Other specified depressive episodes: Secondary | ICD-10-CM

## 2013-01-26 DIAGNOSIS — F329 Major depressive disorder, single episode, unspecified: Secondary | ICD-10-CM

## 2013-01-26 DIAGNOSIS — F32A Depression, unspecified: Secondary | ICD-10-CM

## 2013-01-26 DIAGNOSIS — R42 Dizziness and giddiness: Secondary | ICD-10-CM

## 2013-01-26 DIAGNOSIS — F411 Generalized anxiety disorder: Secondary | ICD-10-CM

## 2013-01-26 MED ORDER — MECLIZINE HCL 25 MG PO TABS: 25.0000 mg | ORAL_TABLET | Freq: Three times a day (TID) | ORAL | Status: DC | PRN

## 2013-01-26 MED ORDER — VENLAFAXINE HCL ER 37.5 MG PO CP24: 37.5000 mg | ORAL_CAPSULE | Freq: Every day | ORAL | Status: DC

## 2013-01-26 NOTE — Patient Instructions (Addendum)
Call if symptoms do not improve with dose decrease Today start the 37.5mg  of effexor  Meclizine as needed for positional vertigo  F/U as previous

## 2013-01-27 ENCOUNTER — Encounter: Payer: Self-pay | Admitting: Family Medicine

## 2013-01-27 DIAGNOSIS — R42 Dizziness and giddiness: Secondary | ICD-10-CM | POA: Insufficient documentation

## 2013-01-27 NOTE — Assessment & Plan Note (Signed)
Per above, we will see how symptoms evolve with change in medication

## 2013-01-27 NOTE — Assessment & Plan Note (Signed)
We will try to decrease her down to 37.5 mg and see if this improves her symptoms at all. If the Effexor is the cause of his symptoms and we will have to discontinue this and try something different to

## 2013-01-27 NOTE — Progress Notes (Signed)
  Subjective:    Patient ID: Sheila [name], female    DOB: 01-09-1966, 47 y.o.   MRN: [id number]  HPI Patient here with continued episodes of dizziness associated with nausea. She was seen a couple weeks ago onto her dizzy episodes may of been due to lower blood pressure and some low blood sugar. However she has had continuous episodes even when she is eating and drinking well. She thinks they started a few weeks after going up on the Effexor. Her symptoms are described as a dizzy and woozy feeling she does not have any headache associated but does feel nauseous when he comes. A few times it was associated with moving her head in a particular direction, other times not. She's had no relieving factors. Denies any injury to the head. Denies any chest pain or shortness of breath or sinus pressure or drainage.   Review of Systems   GEN- denies fatigue, fever, weight loss,weakness, recent illness HEENT- denies eye drainage, change in vision, nasal discharge, CVS- denies chest pain, palpitations RESP- denies SOB, cough, wheeze ABD- +s N/ denies V, change in stools, abd pain Neuro- denies headache, +dizziness, syncope, seizure activity      Objective:   Physical Exam GEN- NAD, alert and oriented x3 HEENT- PERRL, EOMI, non injected sclera, pink conjunctiva, MMM, oropharynx clear, no papilledema, TM clear bilat Neck- Supple, no thyromegaly CVS- RRR, no murmur RESP-CTAB EXT- No edema Pulses- Radial 2+ Psych- normal affect and mood Neuro- CNII-XII in tact, no focal deficits       Assessment & Plan:

## 2013-01-27 NOTE — Assessment & Plan Note (Signed)
Differentials include side effect to the medication, benign positional vertigo, in her ear problem. We will start with decreasing the Effexor. I have given her meclizine to use as needed. We'll see how his symptoms go and followup appointment. Her neurological exam is within normal limits therefore we'll hold on any imaging.

## 2013-02-16 ENCOUNTER — Other Ambulatory Visit: Payer: BC Managed Care – PPO

## 2013-02-19 ENCOUNTER — Ambulatory Visit (INDEPENDENT_AMBULATORY_CARE_PROVIDER_SITE_OTHER): Payer: BC Managed Care – PPO | Admitting: Family Medicine

## 2013-02-19 VITALS — BP 110/78 | HR 68 | Temp 97.6°F | Resp 18 | Wt 139.0 lb

## 2013-02-19 DIAGNOSIS — Z0000 Encounter for general adult medical examination without abnormal findings: Secondary | ICD-10-CM

## 2013-02-19 DIAGNOSIS — F32A Depression, unspecified: Secondary | ICD-10-CM

## 2013-02-19 DIAGNOSIS — F329 Major depressive disorder, single episode, unspecified: Secondary | ICD-10-CM

## 2013-02-19 DIAGNOSIS — F3289 Other specified depressive episodes: Secondary | ICD-10-CM

## 2013-02-19 DIAGNOSIS — G43909 Migraine, unspecified, not intractable, without status migrainosus: Secondary | ICD-10-CM

## 2013-02-19 DIAGNOSIS — E7800 Pure hypercholesterolemia, unspecified: Secondary | ICD-10-CM

## 2013-02-19 MED ORDER — VENLAFAXINE HCL ER 37.5 MG PO CP24: 37.5000 mg | ORAL_CAPSULE | Freq: Every day | ORAL | Status: DC

## 2013-02-19 MED ORDER — SUMATRIPTAN SUCCINATE 100 MG PO TABS: 100.0000 mg | ORAL_TABLET | ORAL | Status: DC | PRN

## 2013-02-19 NOTE — Patient Instructions (Addendum)
Try the imitrex  Continue effexor 37.5mg  daily Make sure you have Calcium 1200mg  and Vitamin D 800IU Low fat/Low cholesterol F/U 6 months

## 2013-02-19 NOTE — Progress Notes (Signed)
  Subjective:    Patient ID: Sheila [name], female    DOB: 04-11-1966, 47 y.o.   MRN: [id number]  HPI  Pt here for CPE. Preventative care and medications reviewed.  Depression/anxiety- she dropped back to effexor 37.5mg  daily and symptoms are still controlled and dizzy spells have resolved. 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 Sleeping well.  Will f/u with GYN to have pelvic exam due to her bladder tact/sling Migraines- has a few migraines a month, often triggered with stress, takes OTC advil, ibuprofen of excedrin which do not help a lot. Limits caffiene, but does not know any food triggers    Review of Systems  GEN- denies fatigue, fever, weight loss,weakness, recent illness HEENT- denies eye drainage, change in vision, nasal discharge, CVS- denies chest pain, palpitations RESP- denies SOB, cough, wheeze ABD- denies N/V, change in stools, abd pain GU- denies dysuria, hematuria, dribbling, incontinence MSK- denies joint pain, muscle aches, injury Neuro- + headache, dizziness, syncope, seizure activity      Objective:   Physical Exam GEN- NAD, alert and oriented x3 HEENT- PERRL, EOMI, non injected sclera, pink conjunctiva, MMM, oropharynx clear, TM clear bilat no effusion,,no maxillary sinus tenderness, nares clear rhinorrhea Neck- Supple, no LAD CVS- RRR, no murmur RESP-CTAB ABD-NABS,soft,NTND EXT- No edema GU-Deferred Pulses- Radial 2+, DP 2+ Psych- normal affect and mood, well groomed         Assessment & Plan:   CPE- Exam normal, no PAP due to hysterectomy, Mammo UTD, immunizations UTD  Reviewed labs

## 2013-02-21 ENCOUNTER — Encounter: Payer: Self-pay | Admitting: Family Medicine

## 2013-02-21 DIAGNOSIS — G43909 Migraine, unspecified, not intractable, without status migrainosus: Secondary | ICD-10-CM | POA: Insufficient documentation

## 2013-02-21 NOTE — Assessment & Plan Note (Signed)
Continue lower dose of effexor

## 2013-02-21 NOTE — Assessment & Plan Note (Signed)
Discussed foods to decrease that are high in cholesterol- cheese, salad dressings,, decrease to lean meats/fish

## 2013-02-21 NOTE — Assessment & Plan Note (Signed)
Trial of imitrex, watch for triggers

## 2013-03-24 ENCOUNTER — Ambulatory Visit (INDEPENDENT_AMBULATORY_CARE_PROVIDER_SITE_OTHER): Payer: BC Managed Care – PPO | Admitting: Family Medicine

## 2013-03-24 ENCOUNTER — Encounter: Payer: Self-pay | Admitting: Family Medicine

## 2013-03-24 VITALS — BP 110/80 | HR 68 | Temp 98.1°F | Resp 18 | Ht 62.0 in | Wt 141.0 lb

## 2013-03-24 DIAGNOSIS — J019 Acute sinusitis, unspecified: Secondary | ICD-10-CM

## 2013-03-24 DIAGNOSIS — J069 Acute upper respiratory infection, unspecified: Secondary | ICD-10-CM

## 2013-03-24 MED ORDER — AZITHROMYCIN 250 MG PO TABS: ORAL_TABLET | ORAL | Status: DC

## 2013-03-24 MED ORDER — HYDROCOD POLST-CHLORPHEN POLST 10-8 MG/5ML PO LQCR: 5.0000 mL | Freq: Two times a day (BID) | ORAL | Status: DC | PRN

## 2013-03-24 NOTE — Progress Notes (Signed)
  Subjective:    Patient ID: Sheila [name], female    DOB: 04/24/1966, 47 y.o.   MRN: [id number]     HPI  Patient here with sinusitis and cough with production worsened over the past 2 weeks. Cough is productive of yellow sputum. She denies any shortness of breath .She thought she was improving after one week if symptoms she was using Advil over-the-counter as needed for the headache and low-grade fever. Her symptoms worsen this week. She's very fatigued today and the cough is nonstop. She did have some cough medication at home which is now run out     Review of Systems   GEN- + fatigue,+ fever, weight loss,weakness, recent illness HEENT- denies eye drainage, change in vision, +nasal discharge, CVS- denies chest pain, palpitations RESP- denies SOB, +cough, wheeze ABD- denies N/V, change in stools, abd pain Neuro- denies headache, dizziness, syncope, seizure activity      Objective:   Physical Exam GEN- NAD, alert and oriented x3, fatigued appearing HEENT- PERRL, EOMI, non injected sclera, pink conjunctiva, MMM, oropharynx mild injection, TM clear bilat no effusion,  + maxillary sinus tenderness,nares clear rhinorrhea  Neck- Supple, no LAD CVS- RRR, no murmur RESP-upper airway congestion, no wheeze, no rhonchi, normal WOB EXT- No edema Pulses- Radial 2+         Assessment & Plan:

## 2013-03-24 NOTE — Patient Instructions (Signed)
Upper respiratory infection and Sinusitis Sudafed if needed Cough medication sent Rest and fluids

## 2013-03-25 ENCOUNTER — Encounter: Payer: Self-pay | Admitting: Family Medicine

## 2013-03-25 DIAGNOSIS — J069 Acute upper respiratory infection, unspecified: Secondary | ICD-10-CM | POA: Insufficient documentation

## 2013-03-25 DIAGNOSIS — J019 Acute sinusitis, unspecified: Secondary | ICD-10-CM | POA: Insufficient documentation

## 2013-03-25 NOTE — Assessment & Plan Note (Signed)
We'll treat due to worsening symptoms and prolonged symptoms. Azithromycin given. She can use Sudafed to help decongest. Cough medicine also refilled. 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

## 2013-03-25 NOTE — Assessment & Plan Note (Signed)
Per above waxining and waning symptoms, treat antibiotics

## 2013-05-06 HISTORY — PX: CHOLECYSTECTOMY: SHX55

## 2013-05-18 ENCOUNTER — Encounter: Payer: Self-pay | Admitting: Family Medicine

## 2013-05-18 ENCOUNTER — Ambulatory Visit (INDEPENDENT_AMBULATORY_CARE_PROVIDER_SITE_OTHER): Payer: BC Managed Care – PPO | Admitting: Family Medicine

## 2013-05-18 VITALS — BP 118/70 | HR 68 | Temp 98.3°F | Resp 18 | Ht 62.5 in | Wt 141.0 lb

## 2013-05-18 DIAGNOSIS — F411 Generalized anxiety disorder: Secondary | ICD-10-CM

## 2013-05-18 DIAGNOSIS — F329 Major depressive disorder, single episode, unspecified: Secondary | ICD-10-CM

## 2013-05-18 DIAGNOSIS — F3289 Other specified depressive episodes: Secondary | ICD-10-CM

## 2013-05-18 DIAGNOSIS — F32A Depression, unspecified: Secondary | ICD-10-CM

## 2013-05-18 MED ORDER — ESCITALOPRAM OXALATE 10 MG PO TABS: 10.0000 mg | ORAL_TABLET | Freq: Every day | ORAL | Status: DC

## 2013-05-18 NOTE — Assessment & Plan Note (Signed)
Mood has deteriorated. Change to Lexapro and see how she does with this. She can stop the Effexor as we had the lowest dose and she did start 10 mg of Lexapro

## 2013-05-18 NOTE — Progress Notes (Signed)
   Subjective:    Patient ID: Sheila [name], female    DOB: 01-10-1966, 48 y.o.   MRN: [id number]  HPI Patient here to followup anxiety and depression. She states that she does not feel like the Effexor is working and she would like to try Lexapro after discussing this with her GYN. She was decreased to 37.5 mg at her last visit a couple months ago because she was having some dizzy spells and she felt better at that dose. She was actually doing well regarding her mood but the past couple weeks have been very difficult. She finds her self crying very easily and getting very anxious. 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 Her sleep is okay but can be improved.    Review of Systems -per above GEN- denies fatigue, fever, weight loss,weakness, recent illness Neuro- denies headache, dizziness, syncope, seizure activity       Objective:   Physical Exam  GEN-NAD,alert and oriented x 3 Psych- stressed and fatigued appearing, not overly anxious, no SI, good eye contact, normal thought process , well groomed.      Assessment & Plan:

## 2013-05-18 NOTE — Patient Instructions (Signed)
Start lexapro 10mg  once a day Stop effexor F/U 4 weeks

## 2013-05-18 NOTE — Assessment & Plan Note (Signed)
Will give her a trial of Lexapro and see how she does with this. Followup in 4 weeks

## 2013-06-15 ENCOUNTER — Ambulatory Visit (INDEPENDENT_AMBULATORY_CARE_PROVIDER_SITE_OTHER): Payer: BC Managed Care – PPO | Admitting: Family Medicine

## 2013-06-15 ENCOUNTER — Encounter: Payer: Self-pay | Admitting: Family Medicine

## 2013-06-15 VITALS — BP 100/70 | HR 78 | Temp 97.9°F | Resp 18 | Ht 62.5 in | Wt 144.0 lb

## 2013-06-15 DIAGNOSIS — K589 Irritable bowel syndrome without diarrhea: Secondary | ICD-10-CM

## 2013-06-15 DIAGNOSIS — F32A Depression, unspecified: Secondary | ICD-10-CM

## 2013-06-15 DIAGNOSIS — F329 Major depressive disorder, single episode, unspecified: Secondary | ICD-10-CM

## 2013-06-15 DIAGNOSIS — F3289 Other specified depressive episodes: Secondary | ICD-10-CM

## 2013-06-15 MED ORDER — PANTOPRAZOLE SODIUM 40 MG PO TBEC: DELAYED_RELEASE_TABLET | ORAL | Status: DC

## 2013-06-15 MED ORDER — ESCITALOPRAM OXALATE 10 MG PO TABS: 10.0000 mg | ORAL_TABLET | Freq: Every day | ORAL | Status: DC

## 2013-06-15 NOTE — Progress Notes (Signed)
Patient ID: Sheila [name], female   DOB: 1965/11/09, 48 y.o.   MRN: [id number]     Subjective:    Patient ID: Sheila [name], female    DOB: [birth year], 48 y.o.   MRN: [id number]  Patient presents for 4 week f/u  she was started on Lexapro 10 mg her last visit and states that this medication is doing well for her she's not had any crying episodes and has felt more stress free. She will like to continue at the current dose.  She also quite about zero bowel syndrome she has a history of difficulties with her bowels as she switches from diarrhea to constipation this is been going on for greater than 20 years. She's never been on medication but has taken over-the-counter Pepto-Bismol as well as Imodium and laxatives depending on her bowel movements. Over the past couple months she's noticed that she gets taken bloated with the changes in her bowel movements and often this will keep her at home because of fear of having diarrhea after meals. No blood in her stool no abdominal pain.    Review Of Systems:  GEN- denies fatigue, fever, weight loss,weakness, recent illness HEENT- denies eye drainage, change in vision, nasal discharge, CVS- denies chest pain, palpitations RESP- denies SOB, cough, wheeze ABD- denies N/V, +change in stools, abd pain Neuro- denies headache, dizziness, syncope, seizure activity       Objective:    BP 100/70  Pulse 78  Temp(Src) 97.9 F (36.6 C) (Oral)  Resp 18  Ht 5' 2.5" (1.588 m)  Wt 144 lb (65.318 kg)  BMI 25.90 kg/m2 GEN- NAD, alert and oriented x3 ABD-NABS,soft,NT,ND Pulses- Radial 2+ Psych- normal affect and mood        Assessment & Plan:      Problem List Items Addressed This Visit   IBS (irritable bowel syndrome) - Primary     Trial of amitiza 57mcg twice a day If not improved will then refer to GI    Relevant Medications      lubiprostone (AMITIZA) 8 MCG capsule      pantoprazole (PROTONIX) EC tablet   Depression     Continue lexapro 10mg   doing well on medication    Relevant Medications      escitalopram (LEXAPRO) tablet      Note: This dictation was prepared with Dragon dictation along with smaller phrase technology. Any transcriptional errors that result from this process are unintentional.

## 2013-06-15 NOTE — Patient Instructions (Signed)
Continue lexapro Start Amitiza call if this helps and script will be sent  F/U 3 months

## 2013-06-15 NOTE — Assessment & Plan Note (Signed)
Trial of amitiza 69mcg twice a day If not improved will then refer to GI

## 2013-06-15 NOTE — Assessment & Plan Note (Signed)
Continue lexapro 10mg  doing well on medication

## 2013-08-20 ENCOUNTER — Ambulatory Visit (INDEPENDENT_AMBULATORY_CARE_PROVIDER_SITE_OTHER): Payer: BC Managed Care – PPO | Admitting: Family Medicine

## 2013-08-20 ENCOUNTER — Encounter: Payer: Self-pay | Admitting: Family Medicine

## 2013-08-20 VITALS — BP 126/68 | HR 64 | Temp 97.9°F | Resp 14 | Ht 63.0 in | Wt 143.0 lb

## 2013-08-20 DIAGNOSIS — J019 Acute sinusitis, unspecified: Secondary | ICD-10-CM

## 2013-08-20 MED ORDER — AZITHROMYCIN 250 MG PO TABS: ORAL_TABLET | ORAL | Status: DC

## 2013-08-20 NOTE — Patient Instructions (Addendum)
D/C appt for next week  Zpak given Use Mucinex D  F/U in May as scheduled

## 2013-08-21 NOTE — Progress Notes (Signed)
Patient ID: Sheila [name], female   DOB: 19-Jul-1965, 48 y.o.   MRN: [id number]   Subjective:    Patient ID: Sheila [name], female    DOB: 11-25-1965, 48 y.o.   MRN: [id number]  Patient presents for sinus infection  patient here with sinus infection. Over the past week or 2 she's had increased nasal drainage pressure feels like her typical sinus infections. She's been told by ENT that she needs surgery however she is holding off on this. She's not had any fever. She does have some postnasal drip which is causing her to cough. She has used some Afrin over-the-counter but tries to limit this. She's tried nasal sprays in the past however these have not helped. She does occasionally take allergy medicine as well.    Review Of Systems:  GEN- denies fatigue, fever, weight loss,weakness, recent illness HEENT- denies eye drainage, change in vision, +nasal discharge, CVS- denies chest pain, palpitations RESP- denies SOB, +cough, wheeze Neuro-+ headache, dizziness, syncope, seizure activity       Objective:    BP 126/68  Pulse 64  Temp(Src) 97.9 F (36.6 C) (Oral)  Resp 14  Ht 5\' 3"  (1.6 m)  Wt 143 lb (64.864 kg)  BMI 25.34 kg/m2 GEN- NAD, alert and oriented x3, flushed in face HEENT- PERRL, EOMI, non injected sclera, pink conjunctiva, MMM, oropharynx clear, TM clear bilat no effusion,  + maxillary sinus tenderness, inflammed turbinates,  Nasal drainage  Neck- Supple, shotty LAD CVS- RRR, no murmur RESP-CTAB EXT- No edema Pulses- Radial 2+          Assessment & Plan:      Problem List Items Addressed This Visit   None    Visit Diagnoses   Acute sinusitis    -  Primary    zpak works well for pt, mucinex D, limit use of Afrin    Relevant Medications       azithromycin (ZITHROMAX) tablet       Note: This dictation was prepared with Dragon dictation along with smaller phrase technology. Any transcriptional errors that result from this process are unintentional.

## 2013-08-24 ENCOUNTER — Ambulatory Visit: Payer: BC Managed Care – PPO | Admitting: Family Medicine

## 2013-09-02 ENCOUNTER — Telehealth: Payer: Self-pay | Admitting: *Deleted

## 2013-09-02 MED ORDER — ESCITALOPRAM OXALATE 20 MG PO TABS: 20.0000 mg | ORAL_TABLET | Freq: Every day | ORAL | Status: DC

## 2013-09-02 NOTE — Telephone Encounter (Signed)
Okay to change to lexapro 20mg  once a day

## 2013-09-02 NOTE — Telephone Encounter (Signed)
Call placed to patient and patient made aware.  

## 2013-09-02 NOTE — Telephone Encounter (Signed)
Received call from patient.   Reported that she would like to increase Lexapro to 20mg  PO QD.   MD please advise.

## 2013-09-13 ENCOUNTER — Ambulatory Visit: Payer: BC Managed Care – PPO | Admitting: Family Medicine

## 2013-10-07 ENCOUNTER — Encounter: Payer: Self-pay | Admitting: Family Medicine

## 2013-10-07 ENCOUNTER — Ambulatory Visit (INDEPENDENT_AMBULATORY_CARE_PROVIDER_SITE_OTHER): Payer: BC Managed Care – PPO | Admitting: Family Medicine

## 2013-10-07 VITALS — BP 96/60 | HR 78 | Temp 97.5°F | Resp 14 | Ht 63.0 in | Wt 140.0 lb

## 2013-10-07 DIAGNOSIS — R509 Fever, unspecified: Secondary | ICD-10-CM

## 2013-10-07 DIAGNOSIS — J019 Acute sinusitis, unspecified: Secondary | ICD-10-CM

## 2013-10-07 LAB — URINALYSIS, MICROSCOPIC ONLY
CRYSTALS: NONE SEEN
Casts: NONE SEEN

## 2013-10-07 LAB — URINALYSIS, ROUTINE W REFLEX MICROSCOPIC
Bilirubin Urine: NEGATIVE
Glucose, UA: NEGATIVE mg/dL
KETONES UR: NEGATIVE mg/dL
NITRITE: NEGATIVE
PH: 5.5 (ref 5.0–8.0)
Protein, ur: NEGATIVE mg/dL
Specific Gravity, Urine: 1.025 (ref 1.005–1.030)
Urobilinogen, UA: 0.2 mg/dL (ref 0.0–1.0)

## 2013-10-07 MED ORDER — LEVOFLOXACIN 750 MG PO TABS: 750.0000 mg | ORAL_TABLET | Freq: Every day | ORAL | Status: DC

## 2013-10-07 NOTE — Progress Notes (Signed)
Subjective:    Patient ID: Sheila [name], female    DOB: 04-22-1966, 48 y.o.   MRN: [id number]  HPI Patient reports a fever between 100 and 102 over the last 3 days. Fever comes and goes. She is also having a dull headache around her frontal sinuses. She also complains of pain in her cheeks. She has a history of chronic recurrent sinusitis. She is also reporting one day of dysuria and frequency. Her urinalysis shows trace amount leukocyte esterase and blood. The patient has history of a hysterectomy and is not having any vaginal bleeding to explain the blood. She denies any neck stiffness. She denies any otalgia. She denies any sore throat. She denies any cough. She denies any nausea vomiting or diarrhea. She denies any rash. Denies any tick bites. Past Medical History  Diagnosis Date  . Allergy     allergic rhinitis  . Anxiety   . Hyperlipidemia   . Acne   . Depression    Current Outpatient Prescriptions on File Prior to Visit  Medication Sig Dispense Refill  . escitalopram (LEXAPRO) 20 MG tablet Take 1 tablet (20 mg total) by mouth daily.  30 tablet  3  . pantoprazole (PROTONIX) 40 MG tablet TAKE 1 TABLET BY MOUTH EVERY DAY  30 tablet  6   No current facility-administered medications on file prior to visit.   Allergies  Allergen Reactions  . Ciprofloxacin     nauseated   History   Social History  . Marital Status: Married    Spouse Name: N/A    Number of Children: N/A  . Years of Education: N/A   Occupational History  . Not on file.   Social History Main Topics  . Smoking status: Never Smoker   . Smokeless tobacco: Never Used  . Alcohol Use: No  . Drug Use: No  . Sexual Activity: Not on file   Other Topics Concern  . Not on file   Social History Narrative   [place]   2 kids   Married 1989      Review of Systems  All other systems reviewed and are negative.      Objective:   Physical Exam  Vitals reviewed. Constitutional:  She appears well-developed and well-nourished. No distress.  HENT:  Right Ear: External ear normal.  Left Ear: External ear normal.  Nose: Mucosal edema, rhinorrhea and septal deviation present. Right sinus exhibits maxillary sinus tenderness. Left sinus exhibits maxillary sinus tenderness.  Mouth/Throat: Oropharynx is clear and moist. No oropharyngeal exudate.  Eyes: Conjunctivae are normal. No scleral icterus.  Neck: Neck supple.  Cardiovascular: Normal rate, regular rhythm, normal heart sounds and intact distal pulses.   No murmur heard. Pulmonary/Chest: Effort normal and breath sounds normal. No respiratory distress. She has no wheezes. She has no rales. She exhibits no tenderness.  Abdominal: Soft. Bowel sounds are normal. She exhibits no distension. There is no tenderness. There is no rebound and no guarding.  Lymphadenopathy:    She has no cervical adenopathy.  Skin: She is not diaphoretic.          Assessment & Plan:  Fever, unspecified - Plan: Urinalysis, Routine w reflex microscopic  Acute rhinosinusitis - Plan: levofloxacin (LEVAQUIN) 750 MG tablet   I believe the patient's fever is likely due to a rhinosinusitis. Urinalysis also shows possible urinary tract infection. I will cover both potential causes with Levaquin 750 mg by mouth daily for 7 days. I will send the urine  for culture. The patient about signs or symptoms of tick born illnesses. She is to call me if the symptoms worsen.  Recheck in 48 hours if no better or sooner if worse.

## 2013-10-09 LAB — URINE CULTURE: Colony Count: 50000

## 2013-11-01 ENCOUNTER — Other Ambulatory Visit: Payer: Self-pay | Admitting: Family Medicine

## 2013-11-01 NOTE — Telephone Encounter (Signed)
Refill appropriate and filled per protocol. 

## 2013-11-24 ENCOUNTER — Other Ambulatory Visit: Payer: Self-pay

## 2013-11-24 DIAGNOSIS — Z1231 Encounter for screening mammogram for malignant neoplasm of breast: Secondary | ICD-10-CM

## 2013-12-01 ENCOUNTER — Telehealth: Payer: Self-pay | Admitting: *Deleted

## 2013-12-01 MED ORDER — PANTOPRAZOLE SODIUM 40 MG PO TBEC: DELAYED_RELEASE_TABLET | ORAL | Status: DC

## 2013-12-01 NOTE — Telephone Encounter (Signed)
Refill appropriate and filled per protocol. 

## 2013-12-07 ENCOUNTER — Telehealth: Payer: Self-pay | Admitting: *Deleted

## 2013-12-07 NOTE — Telephone Encounter (Signed)
Received fax from pharmacy requesting PA for Pantoprazole.   PA submitted.

## 2013-12-13 NOTE — Telephone Encounter (Signed)
Received PA determination.   PA approved.  

## 2013-12-24 ENCOUNTER — Ambulatory Visit
Admission: RE | Admit: 2013-12-24 | Discharge: 2013-12-24 | Disposition: A | Discharge status: Home / Self Care | Payer: BC Managed Care – PPO | Source: Ambulatory Visit

## 2013-12-24 DIAGNOSIS — Z1231 Encounter for screening mammogram for malignant neoplasm of breast: Secondary | ICD-10-CM

## 2013-12-25 ENCOUNTER — Other Ambulatory Visit: Payer: Self-pay | Admitting: Family Medicine

## 2013-12-25 NOTE — Telephone Encounter (Signed)
Refill appropriate and filled per protocol. 

## 2014-01-17 ENCOUNTER — Encounter: Payer: Self-pay | Admitting: Family Medicine

## 2014-01-17 ENCOUNTER — Ambulatory Visit (INDEPENDENT_AMBULATORY_CARE_PROVIDER_SITE_OTHER): Payer: BC Managed Care – PPO | Admitting: Family Medicine

## 2014-01-17 VITALS — BP 118/62 | HR 64 | Temp 97.5°F | Resp 12 | Ht 63.0 in | Wt 141.0 lb

## 2014-01-17 DIAGNOSIS — F411 Generalized anxiety disorder: Secondary | ICD-10-CM

## 2014-01-17 DIAGNOSIS — K589 Irritable bowel syndrome without diarrhea: Secondary | ICD-10-CM

## 2014-01-17 DIAGNOSIS — R600 Localized edema: Secondary | ICD-10-CM | POA: Insufficient documentation

## 2014-01-17 DIAGNOSIS — R609 Edema, unspecified: Secondary | ICD-10-CM

## 2014-01-17 LAB — COMPREHENSIVE METABOLIC PANEL
ALK PHOS: 78 U/L (ref 39–117)
ALT: 21 U/L (ref 0–35)
AST: 19 U/L (ref 0–37)
Albumin: 4.4 g/dL (ref 3.5–5.2)
BILIRUBIN TOTAL: 0.6 mg/dL (ref 0.2–1.2)
BUN: 11 mg/dL (ref 6–23)
CO2: 28 mEq/L (ref 19–32)
Calcium: 9.4 mg/dL (ref 8.4–10.5)
Chloride: 103 mEq/L (ref 96–112)
Creat: 0.69 mg/dL (ref 0.50–1.10)
GLUCOSE: 84 mg/dL (ref 70–99)
Potassium: 4.1 mEq/L (ref 3.5–5.3)
Sodium: 141 mEq/L (ref 135–145)
Total Protein: 6.7 g/dL (ref 6.0–8.3)

## 2014-01-17 LAB — CBC WITH DIFFERENTIAL/PLATELET
BASOS PCT: 1 % (ref 0–1)
Basophils Absolute: 0.1 10*3/uL (ref 0.0–0.1)
EOS ABS: 0.1 10*3/uL (ref 0.0–0.7)
Eosinophils Relative: 2 % (ref 0–5)
HCT: 38.9 % (ref 36.0–46.0)
Hemoglobin: 13.3 g/dL (ref 12.0–15.0)
Lymphocytes Relative: 25 % (ref 12–46)
Lymphs Abs: 1.6 10*3/uL (ref 0.7–4.0)
MCH: 30.2 pg (ref 26.0–34.0)
MCHC: 34.2 g/dL (ref 30.0–36.0)
MCV: 88.4 fL (ref 78.0–100.0)
MONOS PCT: 8 % (ref 3–12)
Monocytes Absolute: 0.5 10*3/uL (ref 0.1–1.0)
NEUTROS PCT: 64 % (ref 43–77)
Neutro Abs: 4.2 10*3/uL (ref 1.7–7.7)
PLATELETS: 292 10*3/uL (ref 150–400)
RBC: 4.4 MIL/uL (ref 3.87–5.11)
RDW: 13.5 % (ref 11.5–15.5)
WBC: 6.5 10*3/uL (ref 4.0–10.5)

## 2014-01-17 LAB — TSH: TSH: 1.493 u[IU]/mL (ref 0.350–4.500)

## 2014-01-17 MED ORDER — HYDROCHLOROTHIAZIDE 12.5 MG PO CAPS: 12.5000 mg | ORAL_CAPSULE | Freq: Every day | ORAL | Status: DC | PRN

## 2014-01-17 MED ORDER — ALPRAZOLAM 0.5 MG PO TABS: 0.5000 mg | ORAL_TABLET | Freq: Every evening | ORAL | Status: DC | PRN

## 2014-01-17 MED ORDER — PANTOPRAZOLE SODIUM 40 MG PO TBEC: DELAYED_RELEASE_TABLET | ORAL | Status: DC

## 2014-01-17 MED ORDER — ESCITALOPRAM OXALATE 20 MG PO TABS
ORAL_TABLET | ORAL | Status: DC
Start: 2014-01-17 — End: 2014-04-15

## 2014-01-17 NOTE — Assessment & Plan Note (Signed)
Add xanax 0.5mg  to take in AM to help with anxiety, continue lexapro Her stress levels cause her IBS to worsen as well Change in occupation would be beneficial but [place]

## 2014-01-17 NOTE — Assessment & Plan Note (Signed)
No evidence of liver failure or congestive heart failure. The bloating in her abdomen that sound more like her ear about syndrome. I've given her some HCTZ 12.5 mg to use as needed I will obtain some labs today including thyroid metabolic panel CBC. She's also working on her diet and has joined [employer] and will limit salt intake

## 2014-01-17 NOTE — Patient Instructions (Signed)
Try the xanax as needed Continue current medications Try the HCTZ as needed We will call with lab results F/U as needed

## 2014-01-17 NOTE — Progress Notes (Signed)
Patient ID: Sheila [name], female   DOB: [birth year], 48 y.o.   MRN: [id number]   Subjective:    Patient ID: Sheila [name], female    DOB: Dec 13, 1965, 48 y.o.   MRN: [id number]  Patient presents for Bloating and Ear Pain  patient here with a few concerns. She was seen by urgent care yesterday secondary to left conjunctivitis she has Vigamox eye drops. Over the weekend she felt very bloated she says her hands and feet were swelling she did not increase her activity and to drinking plenty of fluids she states that this does happen when her ear about get out of control on her nerves are bad. She did take an over-the-counter diuretic which helped with the swelling and fluid she states that she gained about 3-4 pounds and that is now resolved. She denies any chest pain shortness of breath during the episode. She has had this happen on and off over the past 6 months or so.  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 She's taking her Lexapro as prescribed. She denies any suicidal ideations no hallucinations. She does have a good support network at home.  She also is been having increased left ear pain she is an appointment to see her in her throat this week she's had recurrent sinusitis issues and they may proceed with surgical intervention  Review Of Systems:  GEN- denies fatigue, fever, weight loss,weakness, recent illness HEENT- denies eye drainage, change in vision, nasal discharge, CVS- denies chest pain, palpitations RESP- denies SOB, cough, wheeze ABD- denies N/V, change in stools, abd pain GU- denies dysuria, hematuria, dribbling, incontinence MSK- denies joint pain, muscle  aches, injury Neuro- denies headache, dizziness, syncope, seizure activity       Objective:    BP 118/62  Pulse 64  Temp(Src) 97.5 F (36.4 C) (Oral)  Resp 12  Ht 5\' 3"  (1.6 m)  Wt 141 lb (63.957 kg)  BMI 24.98 kg/m2 GEN- NAD, alert and oriented x3 HEENT- PERRL, EOMI, + left injected sclera, right pink conjunctiva, mild injection of Left conjunctiva, MMM, oropharynx clear, TM CLear bilat, no effusion, no maxillary sinus tenderness  Neck- Supple, no JVD, no thyromegaly CVS- RRR, no murmur RESP-CTAB ABD-NABS,soft,NT,ND EXT- No edema Pulses- Radial, DP- 2+        Assessment & Plan:      Problem List Items Addressed This Visit   None      Note: This dictation was prepared with Dragon dictation along with smaller phrase technology. Any transcriptional errors that result from this process are unintentional.

## 2014-01-18 ENCOUNTER — Ambulatory Visit: Payer: BC Managed Care – PPO | Admitting: Family Medicine

## 2014-03-10 ENCOUNTER — Other Ambulatory Visit: Payer: Self-pay | Admitting: Family Medicine

## 2014-03-10 NOTE — Telephone Encounter (Signed)
Medication refilled per protocol. 

## 2014-03-27 ENCOUNTER — Observation Stay: Payer: Self-pay | Admitting: Surgery

## 2014-03-27 LAB — COMPREHENSIVE METABOLIC PANEL
ALBUMIN: 3.9 g/dL (ref 3.4–5.0)
Alkaline Phosphatase: 82 U/L
Anion Gap: 9 (ref 7–16)
BUN: 12 mg/dL (ref 7–18)
Bilirubin,Total: 0.3 mg/dL (ref 0.2–1.0)
CO2: 25 mmol/L (ref 21–32)
CREATININE: 0.77 mg/dL (ref 0.60–1.30)
Calcium, Total: 9.5 mg/dL (ref 8.5–10.1)
Chloride: 108 mmol/L — ABNORMAL HIGH (ref 98–107)
EGFR (African American): >60
EGFR (Non-African Amer.): >60
Glucose: 117 mg/dL — ABNORMAL HIGH (ref 65–99)
Osmolality: 284 (ref 275–301)
Potassium: 3.7 mmol/L (ref 3.5–5.1)
SGOT(AST): 27 U/L (ref 15–37)
SGPT (ALT): 22 U/L
Sodium: 142 mmol/L (ref 136–145)
TOTAL PROTEIN: 7.4 g/dL (ref 6.4–8.2)

## 2014-03-27 LAB — CBC WITH DIFFERENTIAL/PLATELET
BASOS PCT: 0.2 %
Basophil #: 0 10*3/uL (ref 0.0–0.1)
EOS ABS: 0.1 10*3/uL (ref 0.0–0.7)
Eosinophil %: 1.3 %
HCT: 42 % (ref 35.0–47.0)
HGB: 13.9 g/dL (ref 12.0–16.0)
Lymphocyte #: 1.5 10*3/uL (ref 1.0–3.6)
Lymphocyte %: 15.6 %
MCH: 30.5 pg (ref 26.0–34.0)
MCHC: 33.1 g/dL (ref 32.0–36.0)
MCV: 92 fL (ref 80–100)
Monocyte #: 0.6 x10 3/mm (ref 0.2–0.9)
Monocyte %: 6.2 %
Neutrophil #: 7.2 10*3/uL — ABNORMAL HIGH (ref 1.4–6.5)
Neutrophil %: 76.7 %
PLATELETS: 252 10*3/uL (ref 150–440)
RBC: 4.56 10*6/uL (ref 3.80–5.20)
RDW: 13 % (ref 11.5–14.5)
WBC: 9.4 10*3/uL (ref 3.6–11.0)

## 2014-03-27 LAB — URINALYSIS, COMPLETE
Bilirubin,UR: NEGATIVE
Blood: NEGATIVE
GLUCOSE, UR: NEGATIVE mg/dL (ref 0–75)
KETONE: NEGATIVE
LEUKOCYTE ESTERASE: NEGATIVE
Nitrite: NEGATIVE
PH: 6 (ref 4.5–8.0)
Protein: NEGATIVE
SPECIFIC GRAVITY: 1.008 (ref 1.003–1.030)
Squamous Epithelial: 1
WBC UR: 1 /HPF (ref 0–5)

## 2014-03-27 LAB — LIPASE, BLOOD: Lipase: 132 U/L (ref 73–393)

## 2014-03-27 LAB — TROPONIN I: Troponin-I: <0.02 ng/mL

## 2014-04-15 ENCOUNTER — Encounter (HOSPITAL_COMMUNITY): Payer: Self-pay

## 2014-04-15 ENCOUNTER — Emergency Department (HOSPITAL_COMMUNITY): Payer: BC Managed Care – PPO

## 2014-04-15 ENCOUNTER — Emergency Department (HOSPITAL_COMMUNITY)
Admission: EM | Admit: 2014-04-15 | Discharge: 2014-04-15 | Disposition: A | Discharge status: Home / Self Care | Payer: BC Managed Care – PPO | Attending: Emergency Medicine | Admitting: Emergency Medicine

## 2014-04-15 DIAGNOSIS — R1013 Epigastric pain: Secondary | ICD-10-CM

## 2014-04-15 DIAGNOSIS — Z90710 Acquired absence of both cervix and uterus: Secondary | ICD-10-CM | POA: Insufficient documentation

## 2014-04-15 DIAGNOSIS — R200 Anesthesia of skin: Secondary | ICD-10-CM | POA: Insufficient documentation

## 2014-04-15 DIAGNOSIS — F419 Anxiety disorder, unspecified: Secondary | ICD-10-CM | POA: Diagnosis not present

## 2014-04-15 DIAGNOSIS — R110 Nausea: Secondary | ICD-10-CM | POA: Insufficient documentation

## 2014-04-15 DIAGNOSIS — R1084 Generalized abdominal pain: Secondary | ICD-10-CM | POA: Diagnosis present

## 2014-04-15 DIAGNOSIS — Z8639 Personal history of other endocrine, nutritional and metabolic disease: Secondary | ICD-10-CM | POA: Diagnosis not present

## 2014-04-15 DIAGNOSIS — R109 Unspecified abdominal pain: Secondary | ICD-10-CM

## 2014-04-15 DIAGNOSIS — F329 Major depressive disorder, single episode, unspecified: Secondary | ICD-10-CM | POA: Insufficient documentation

## 2014-04-15 DIAGNOSIS — Z872 Personal history of diseases of the skin and subcutaneous tissue: Secondary | ICD-10-CM | POA: Diagnosis not present

## 2014-04-15 DIAGNOSIS — Z9049 Acquired absence of other specified parts of digestive tract: Secondary | ICD-10-CM | POA: Diagnosis not present

## 2014-04-15 DIAGNOSIS — Z79899 Other long term (current) drug therapy: Secondary | ICD-10-CM | POA: Insufficient documentation

## 2014-04-15 LAB — CBC WITH DIFFERENTIAL/PLATELET
BASOS ABS: 0 10*3/uL (ref 0.0–0.1)
BASOS PCT: 0 % (ref 0–1)
EOS PCT: 3 % (ref 0–5)
Eosinophils Absolute: 0.1 10*3/uL (ref 0.0–0.7)
HEMATOCRIT: 41.4 % (ref 36.0–46.0)
Hemoglobin: 13.9 g/dL (ref 12.0–15.0)
Lymphocytes Relative: 33 % (ref 12–46)
Lymphs Abs: 1.8 10*3/uL (ref 0.7–4.0)
MCH: 29.8 pg (ref 26.0–34.0)
MCHC: 33.6 g/dL (ref 30.0–36.0)
MCV: 88.8 fL (ref 78.0–100.0)
MONO ABS: 0.4 10*3/uL (ref 0.1–1.0)
Monocytes Relative: 8 % (ref 3–12)
Neutro Abs: 3.1 10*3/uL (ref 1.7–7.7)
Neutrophils Relative %: 56 % (ref 43–77)
Platelets: 262 10*3/uL (ref 150–400)
RBC: 4.66 MIL/uL (ref 3.87–5.11)
RDW: 12.1 % (ref 11.5–15.5)
WBC: 5.5 10*3/uL (ref 4.0–10.5)

## 2014-04-15 LAB — URINALYSIS, ROUTINE W REFLEX MICROSCOPIC
Bilirubin Urine: NEGATIVE
Glucose, UA: NEGATIVE mg/dL
Hgb urine dipstick: NEGATIVE
Ketones, ur: NEGATIVE mg/dL
NITRITE: NEGATIVE
PH: 7.5 (ref 5.0–8.0)
Protein, ur: NEGATIVE mg/dL
SPECIFIC GRAVITY, URINE: 1.014 (ref 1.005–1.030)
Urobilinogen, UA: 0.2 mg/dL (ref 0.0–1.0)

## 2014-04-15 LAB — URINE MICROSCOPIC-ADD ON

## 2014-04-15 LAB — COMPREHENSIVE METABOLIC PANEL
ALBUMIN: 4.2 g/dL (ref 3.5–5.2)
ALT: 25 U/L (ref 0–35)
AST: 33 U/L (ref 0–37)
Alkaline Phosphatase: 85 U/L (ref 39–117)
Anion gap: 15 (ref 5–15)
BUN: 10 mg/dL (ref 6–23)
CALCIUM: 9.9 mg/dL (ref 8.4–10.5)
CO2: 23 mEq/L (ref 19–32)
CREATININE: 0.6 mg/dL (ref 0.50–1.10)
Chloride: 103 mEq/L (ref 96–112)
GFR calc Af Amer: >90 mL/min (ref >90)
GFR calc non Af Amer: >90 mL/min (ref >90)
Glucose, Bld: 99 mg/dL (ref 70–99)
Potassium: 4.3 mEq/L (ref 3.7–5.3)
Sodium: 141 mEq/L (ref 137–147)
Total Bilirubin: 0.4 mg/dL (ref 0.3–1.2)
Total Protein: 7.6 g/dL (ref 6.0–8.3)

## 2014-04-15 LAB — LIPASE, BLOOD: LIPASE: 29 U/L (ref 11–59)

## 2014-04-15 LAB — I-STAT TROPONIN, ED: Troponin i, poc: 0 ng/mL (ref 0.00–0.08)

## 2014-04-15 MED ORDER — ONDANSETRON HCL 4 MG/2ML IJ SOLN
4.0000 mg | Freq: Once | INTRAMUSCULAR | Status: AC
  Administered 2014-04-15: 4 mg via INTRAVENOUS
  Filled 2014-04-15: qty 2

## 2014-04-15 MED ORDER — SODIUM CHLORIDE 0.9 % IV BOLUS (SEPSIS)
1000.0000 mL | Freq: Once | INTRAVENOUS | Status: AC
  Administered 2014-04-15: 1000 mL via INTRAVENOUS

## 2014-04-15 MED ORDER — IOHEXOL 300 MG/ML  SOLN
25.0000 mL | INTRAMUSCULAR | Status: DC | PRN
  Administered 2014-04-15: 25 mL via ORAL
  Filled 2014-04-15: qty 30

## 2014-04-15 MED ORDER — FAMOTIDINE 20 MG PO TABS: 20.0000 mg | ORAL_TABLET | Freq: Two times a day (BID) | ORAL | Status: DC

## 2014-04-15 MED ORDER — IOHEXOL 300 MG/ML  SOLN
100.0000 mL | Freq: Once | INTRAMUSCULAR | Status: AC | PRN
  Administered 2014-04-15: 100 mL via INTRAVENOUS

## 2014-04-15 MED ORDER — MORPHINE SULFATE 4 MG/ML IJ SOLN
4.0000 mg | Freq: Once | INTRAMUSCULAR | Status: AC
  Administered 2014-04-15: 4 mg via INTRAVENOUS
  Filled 2014-04-15: qty 1

## 2014-04-15 MED ORDER — HYDROMORPHONE HCL 1 MG/ML IJ SOLN
0.5000 mg | Freq: Once | INTRAMUSCULAR | Status: AC
Start: 2014-04-15 — End: 2014-04-15
  Administered 2014-04-15: 0.5 mg via INTRAVENOUS
  Filled 2014-04-15: qty 1

## 2014-04-15 MED ORDER — FAMOTIDINE 20 MG PO TABS
20.0000 mg | ORAL_TABLET | Freq: Once | ORAL | Status: AC
  Administered 2014-04-15: 20 mg via ORAL
  Filled 2014-04-15: qty 1

## 2014-04-15 NOTE — ED Notes (Signed)
Patient transported to X-ray 

## 2014-04-15 NOTE — ED Notes (Signed)
Pt. Presents to ED post cholecystectomy Nov. 21 with complaint of increased epigastric pain. Pt. States that she began to feel SOB, nauseous, and sweaty during a meeting. Pt. Denies hx of ulcer or increased NSAID usage. Pt. States it feels "difficult to catch breath." states has had low grade fever this whole week, afebrile on assessment.

## 2014-04-15 NOTE — ED Provider Notes (Signed)
The patient is a 47 year old female, she has a history of a cholecystectomy which was performed in the last several weeks at [facility], she has had ongoing epigastric and right-sided abdominal pain since that time which seems to have gotten slightly worse. She has ongoing diarrhea but denies any chills. She has had some subjective fevers which she has taken at home at only 99, no objective fever. She denies nausea or vomiting. On exam she has a soft abdomen which is tender in the epigastrium and the right mid abdomen, no guarding, no peritoneal signs, normal heart rate and lung exam. The patient also had an anxiety attack this morning while she was working with a "angry." As she is a principal at a elementary school. She had numbness of her lips and a feeling of shortness of breath and severe epigastric pain when this happened, this has gradually eased off and only the abdominal pain is left at this time. Check labs, CT of the abdomen to rule out biliary leak. Patient is in agreement with the plan.  CT scan without clear findings for source of pain - improved with meds and has non surgical abdomen, stable for d/c  Medical screening examination/treatment/procedure(s) were conducted as a shared visit with non-physician practitioner(s) and myself.  I personally evaluated the patient during the encounter.  Clinical Impression:   Final diagnoses:  Epigastric abdominal pain  Abdominal pain         Johnna Acosta, MD 04/16/14 [phone]

## 2014-04-15 NOTE — Discharge Instructions (Signed)
Call your Surgeon for further evaluation of your abdominal pain. Call a Gi specialist for further evaluation of your abdominal discomfort. Call for a follow up appointment with a Family or Primary Care Provider.  Return if Symptoms worsen.   Take medication as prescribed.

## 2014-04-15 NOTE — ED Notes (Signed)
Patient transported to CT 

## 2014-04-15 NOTE — ED Provider Notes (Signed)
CSN: [id number]     Arrival date & time 04/15/14  3016 History   First MD Initiated Contact with Patient 04/15/14 [phone]     No chief complaint on file.    (Consider location/radiation/quality/duration/timing/severity/associated sxs/prior Treatment) HPI Comments: The patient is a 48 year old female with past mental history of anxiety, hyperlipidemia, depression, allergies presents emergency room chief complaint of epigastric discomfort with abrupt onset since today.  The patient reports cholecystectomy 3 weeks ago and has had generalized abdominal discomfort since.  Patient reports generalized discomfort as dull, epigastric discomfort as sharp, worsened with emotional upset. She reports associated shortness of breath due to pain with associated diaphoresis and perioral paresthesias. Patient reports associated nausea without emesis. Reports persistent loose stools since surgery.  Patient denies history of known cardiac disease, early MI in first-degree relatives, chest discomfort, palpitations, lower extremity edema. Patient denies history of PUD, increase in NSAID use.  Patient complains of weight loss.  Patient is a 48 y.o. female presenting with abdominal pain. The history is provided by the patient. No language interpreter was used.  Abdominal Pain Associated symptoms: nausea   Associated symptoms: no chills, no constipation, no cough, no diarrhea, no fever and no vomiting     Past Medical History  Diagnosis Date  . Allergy     allergic rhinitis  . Anxiety   . Hyperlipidemia   . Acne   . Depression    Past Surgical History  Procedure Laterality Date  . Appendectomy    . Bladder surgery  03/09  . Incontinence surgery    . Abdominal hysterectomy      partial hyst cystocele and rectocele  . Bladder tack      2nd bladder surgery   Family History  Problem Relation Age of Onset  . Diabetes Father   . Breast cancer Neg Hx   . Colon cancer Neg Hx    History  Substance Use Topics   . Smoking status: Never Smoker   . Smokeless tobacco: Never Used  . Alcohol Use: No   OB History    No data available     Review of Systems  Constitutional: Negative for fever and chills.  Respiratory: Negative for cough.   Gastrointestinal: Positive for nausea and abdominal pain. Negative for vomiting, diarrhea and constipation.  Neurological: Positive for numbness.      Allergies  Ciprofloxacin  Home Medications   Prior to Admission medications   Medication Sig Start Date End Date Taking? Authorizing Provider  ALPRAZolam Duanne Moron) 0.5 MG tablet Take 1 tablet (0.5 mg total) by mouth at bedtime as needed for anxiety. 01/17/14   Alycia Rossetti, MD  escitalopram (LEXAPRO) 20 MG tablet TAKE 1 TABLET (20 MG TOTAL) BY MOUTH DAILY. 01/17/14   Alycia Rossetti, MD  hydrochlorothiazide (MICROZIDE) 12.5 MG capsule TAKE 1 CAPSULE BY MOUTH DAILY AS NEEDED. FOR SWELLING 03/10/14   Alycia Rossetti, MD  pantoprazole (PROTONIX) 40 MG tablet TAKE 1 TABLET BY MOUTH EVERY DAY 01/17/14   Alycia Rossetti, MD   There were no vitals taken for this visit. Physical Exam  Constitutional: She is oriented to person, place, and time. She appears well-developed and well-nourished. No distress.  HENT:  Head: Normocephalic and atraumatic.  Eyes: No scleral icterus.  Neck: Neck supple.  Cardiovascular: Normal rate and regular rhythm.   No lower extremity edema  Pulmonary/Chest: Effort normal. She has no wheezes. She has no rales. She exhibits no tenderness.  Patient is able to speak in complete  sentences.  Abdominal: Soft. Normal appearance. She exhibits no distension. There is tenderness in the epigastric area. There is no rebound and no guarding.  Multiple well healing scars, no drainage, no surrounding erythema.  Musculoskeletal: Normal range of motion.  Neurological: She is alert and oriented to person, place, and time.  Skin: Skin is warm and dry. She is not diaphoretic.  Psychiatric: Her behavior  is normal. Her mood appears anxious.  Nursing note and vitals reviewed.   ED Course  Procedures (including critical care time) Labs Review Labs Reviewed  URINALYSIS, ROUTINE W REFLEX MICROSCOPIC - Abnormal; Notable for the following:    APPearance HAZY (*)    Leukocytes, UA SMALL (*)    All other components within normal limits  URINE MICROSCOPIC-ADD ON - Abnormal; Notable for the following:    Squamous Epithelial / LPF MANY (*)    Bacteria, UA MANY (*)    All other components within normal limits  CBC WITH DIFFERENTIAL  COMPREHENSIVE METABOLIC PANEL  LIPASE, BLOOD  I-STAT TROPOININ, ED    Imaging Review Dg Chest 2 View  04/15/2014   CLINICAL DATA:  48 year old female with epigastric abdominal pain. Recent cholecystectomy. Acute shortness of Breath. Initial encounter.  EXAM: CHEST  2 VIEW  COMPARISON:  Portable chest radiograph 06/10/2006.  FINDINGS: Normal lung volumes. Normal cardiac size and mediastinal contours. Visualized tracheal air column is within normal limits. Mild increased interstitial markings are stable. Punctate right upper lobe nodular density is stable. Otherwise the lungs are clear. No pneumothorax, pleural effusion, or pneumoperitoneum. Negative visible bowel gas pattern. Cholecystectomy clips. No acute osseous abnormality identified.  IMPRESSION: No acute cardiopulmonary abnormality.   Electronically Signed   By: Lars Pinks M.D.   On: 04/15/2014 10:38   Ct Abdomen Pelvis W Contrast  04/15/2014   CLINICAL DATA:  48 year old female with intermittent epigastric pain. Status post cholecystectomy in November. Continued pain. Subsequent encounter.  EXAM: CT ABDOMEN AND PELVIS WITH CONTRAST  TECHNIQUE: Multidetector CT imaging of the abdomen and pelvis was performed using the standard protocol following bolus administration of intravenous contrast.  CONTRAST:  1103mL OMNIPAQUE IOHEXOL 300 MG/ML  SOLN  COMPARISON:  [facility] right upper quadrant  ultrasound 03/27/2014 and earlier.  FINDINGS: Negative lung bases.  No pericardial or pleural effusion.  No acute osseous abnormality identified.  No pelvic free fluid. Uterus surgically absent. Adnexa are diminutive and normal. Diminutive bladder. Multiple left side pelvic phleboliths.  Negative rectum. Redundant but otherwise negative sigmoid. Negative left colon. Redundant but otherwise negative transverse colon. The cecum is fluid-filled. The terminal ileum is normal. No appendix is identified. No cecal or right colon inflammation. No dilated small bowel. Oral contrast in the proximal small bowel and stomach. Negative stomach and duodenum.  Surgically absent gallbladder with otherwise normal appearance of the liver and gallbladder fossa. Portal venous system within normal limits.  Spleen, pancreas, and adrenal glands are normal.  Punctate nonobstructing calculi in the right renal upper and lower pole. There are occasional tiny renal cysts. Otherwise normal kidneys. Normal and symmetric contrast excretion.  No abdominal free fluid. Major arterial structures in the abdomen and pelvis are normal. No lymphadenopathy.  IMPRESSION: 1. No adverse features identified status post cholecystectomy. 2. Right nephrolithiasis without obstructive uropathy. 3. Appendix and uterus also appear to be surgically absent.   Electronically Signed   By: Lars Pinks M.D.   On: 04/15/2014 12:52     EKG Interpretation   Date/Time:  Friday April 15 2014  09:58:14 EST Ventricular Rate:  62 PR Interval:  129 QRS Duration: 98 QT Interval:  410 QTC Calculation: 416 R Axis:   76 Text Interpretation:  Sinus rhythm Normal ECG since last tracing no  significant change Confirmed by MILLER  MD, BRIAN ([id number]) on 04/15/2014  10:13:08 AM      MDM   Final diagnoses:  Epigastric abdominal pain  Abdominal pain   Patient with recent recall second me presents with epigastric discomfort, after emotional upset patient describes  anxiety attack. Patient has epigastric tenderness reproducible with palpation. Labs ordered, chest x-ray ordered, EKG, pain medication, fluids given. EKG G normal without concerning change from previous. Dr. Sabra Heck also evaluated the patient during this encounter. Advises CT for epi gastric tenderness and right sided abdominal discomfort with recent surgery. Labs unremarkable, specifically LFTs, lipase. UA shows many bacteria with many squamous, likely due to contamination. CT negative for acute findings. Reevaluation patient resting comfortably in room, appears more relaxed, reports moderate resolution of symptoms with persistent dull epigastric discomfort. Discussed treating for possible ulcer, reflux, follow-up with GI, follow-up with general surgeon. Discussed lab results, imaging results, and treatment plan with the patient. Return precautions given. Reports understanding and no other concerns at this time.  Patient is stable for discharge at this time.   Meds given in ED:  Medications  iohexol (OMNIPAQUE) 300 MG/ML solution 25 mL (25 mLs Oral Contrast Given 04/15/14 1130)  morphine 4 MG/ML injection 4 mg (4 mg Intravenous Given 04/15/14 1011)  sodium chloride 0.9 % bolus 1,000 mL (0 mLs Intravenous Stopped 04/15/14 1125)  ondansetron (ZOFRAN) injection 4 mg (4 mg Intravenous Given 04/15/14 1039)  HYDROmorphone (DILAUDID) injection 0.5 mg (0.5 mg Intravenous Given 04/15/14 1125)  iohexol (OMNIPAQUE) 300 MG/ML solution 100 mL (100 mLs Intravenous Contrast Given 04/15/14 1149)  famotidine (PEPCID) tablet 20 mg (20 mg Oral Given 04/15/14 1316)    Discharge Medication List as of 04/15/2014  1:21 PM    START taking these medications   Details  famotidine (PEPCID) 20 MG tablet Take 1 tablet (20 mg total) by mouth 2 (two) times daily., Starting 04/15/2014, Until Discontinued, Print          Harvie Heck, PA-C 04/15/14 1534  Johnna Acosta, MD 04/16/14 [phone]

## 2014-04-18 ENCOUNTER — Ambulatory Visit (INDEPENDENT_AMBULATORY_CARE_PROVIDER_SITE_OTHER): Payer: BC Managed Care – PPO | Admitting: Family Medicine

## 2014-04-18 ENCOUNTER — Encounter: Payer: Self-pay | Admitting: Family Medicine

## 2014-04-18 VITALS — BP 120/70 | HR 78 | Temp 98.1°F | Resp 16 | Ht 63.0 in | Wt 131.0 lb

## 2014-04-18 DIAGNOSIS — R319 Hematuria, unspecified: Secondary | ICD-10-CM

## 2014-04-18 DIAGNOSIS — F411 Generalized anxiety disorder: Secondary | ICD-10-CM

## 2014-04-18 LAB — URINALYSIS, MICROSCOPIC ONLY
CASTS: NONE SEEN
CRYSTALS: NONE SEEN

## 2014-04-18 LAB — URINALYSIS, ROUTINE W REFLEX MICROSCOPIC
Bilirubin Urine: NEGATIVE
GLUCOSE, UA: NEGATIVE mg/dL
KETONES UR: NEGATIVE mg/dL
Nitrite: NEGATIVE
Protein, ur: NEGATIVE mg/dL
SPECIFIC GRAVITY, URINE: 1.015 (ref 1.005–1.030)
UROBILINOGEN UA: 0.2 mg/dL (ref 0.0–1.0)
pH: 7.5 (ref 5.0–8.0)

## 2014-04-18 MED ORDER — CEPHALEXIN 500 MG PO CAPS: 500.0000 mg | ORAL_CAPSULE | Freq: Four times a day (QID) | ORAL | Status: DC

## 2014-04-18 NOTE — Patient Instructions (Signed)
Start antibiotics as prescribed Okay to take pepcid at bedtime for the next week 1/2 tablet Xanax as prescribed  Release of records- [facility]  F/U 2 weeks

## 2014-04-19 MED ORDER — ALPRAZOLAM 0.5 MG PO TABS: 0.5000 mg | ORAL_TABLET | Freq: Two times a day (BID) | ORAL | Status: DC | PRN

## 2014-04-19 NOTE — Progress Notes (Signed)
Patient ID: Sheila [name], female   DOB: [birth year], 48 y.o.   MRN: [id number]   Subjective:    Patient ID: Sheila [name], female    DOB: [birth year], 48 y.o.   MRN: [id number]  Patient presents for Hematuria patient here with hematuria which started this morning. She was admitted to [facility] secondary to cholecystitis she is status post gallbladder removal in November 22 she was out of work for about a week and then she returned on December 11 she was in a very heated conversation with the parent and she actually had a panic attack she became sweaty all over her she started having palpitations she had tingling and numbness in her face and her hands she also began to have severe abdominal pain and therefore she went to the emergency room. CT scan which was done showed a small nonobstructive stone in her right kidney otherwise unremarkable her labs were also unremarkable. Her appetite has improved since her surgery. She did not have her Xanax prior to the panic attack episode. This morning however she noticed some blood in her urine and she's also had some urinary frequency and pressure.her year was fairly unremarkable at the emergency room but a culture was not sent.    Review Of Systems: per above  GEN- denies fatigue, fever, weight loss,weakness, recent illness HEENT- denies eye drainage, change in vision, nasal discharge, CVS- denies chest pain, palpitations RESP- denies SOB, cough, wheeze ABD- denies N/V, change in stools,+ abd pain GU- denies dysuria, +hematuria, dribbling, incontinence MSK- denies joint pain, muscle aches, injury Neuro- denies headache, dizziness, syncope, seizure activity       Objective:    BP 120/70 mmHg  Pulse 78  Temp(Src) 98.1 F (36.7 C) (Oral)  Resp 16  Ht 5\' 3"  (1.6 m)  Wt 131 lb (59.421 kg)  BMI 23.21 kg/m2 GEN- NAD, alert and oriented x3,fatigued appearing HEENT- PERRL, EOMI, non injected sclera, pink conjunctiva, MMM, oropharynx clear CVS- RRR,  no murmur RESP-CTAB ABD-NABS,soft,NT,ND, no CVA tenderness, incision d/c/i Psych- normal affect and mood EXT- No edema Pulses- Radial 2+        Assessment & Plan:      Problem List Items Addressed This Visit    None    Visit Diagnoses    Hematuria    -  Primary    Urine culture sent, start keflex QID, recent foley cath with surgery now with symptoms, doubt stone as no current pain, may need urology    Relevant Orders       Urinalysis, Routine w reflex microscopic (Completed)       Urine culture       Note: This dictation was prepared with Dragon dictation along with smaller phrase technology. Any transcriptional errors that result from this process are unintentional.

## 2014-04-19 NOTE — Assessment & Plan Note (Signed)
Worsening anxiety now with panic attacks associated we have discussed her stress levels in her episodes at work. She did not have her Xanax to 5 certain that she can take a half a tablet if she feels the symptoms coming on at do think these are wholeheartedly related to her anxiety and stressful job. I've given her a couple days off of work as well she needs assist to recover from her surgery.

## 2014-04-19 NOTE — Addendum Note (Signed)
Addended by: Vic Blackbird F on: 04/19/2014 05:31 PM   Modules accepted: Orders

## 2014-04-20 LAB — URINE CULTURE: Colony Count: 30000

## 2014-04-22 ENCOUNTER — Other Ambulatory Visit: Payer: Self-pay | Admitting: *Deleted

## 2014-04-22 MED ORDER — DOXYCYCLINE HYCLATE 100 MG PO TABS: 100.0000 mg | ORAL_TABLET | Freq: Two times a day (BID) | ORAL | Status: DC

## 2014-04-26 ENCOUNTER — Encounter: Payer: Self-pay | Admitting: Family Medicine

## 2014-05-02 ENCOUNTER — Ambulatory Visit (INDEPENDENT_AMBULATORY_CARE_PROVIDER_SITE_OTHER): Payer: BC Managed Care – PPO | Admitting: Family Medicine

## 2014-05-02 VITALS — BP 110/62 | HR 64 | Temp 98.5°F | Resp 12 | Ht 63.0 in | Wt 130.0 lb

## 2014-05-02 DIAGNOSIS — R319 Hematuria, unspecified: Secondary | ICD-10-CM

## 2014-05-02 DIAGNOSIS — F411 Generalized anxiety disorder: Secondary | ICD-10-CM

## 2014-05-02 LAB — URINALYSIS, ROUTINE W REFLEX MICROSCOPIC
BILIRUBIN URINE: NEGATIVE
GLUCOSE, UA: NEGATIVE mg/dL
Hgb urine dipstick: NEGATIVE
KETONES UR: NEGATIVE mg/dL
Leukocytes, UA: NEGATIVE
Nitrite: NEGATIVE
PH: 5.5 (ref 5.0–8.0)
Protein, ur: NEGATIVE mg/dL
UROBILINOGEN UA: 0.2 mg/dL (ref 0.0–1.0)

## 2014-05-02 NOTE — Patient Instructions (Addendum)
Continue current medications F/U 3 months

## 2014-05-02 NOTE — Assessment & Plan Note (Signed)
She is working on breathing techniques, she has meds but wants to try to talk her self down before using Also discussed therapy through her job she will look into this Continue lexapro

## 2014-05-02 NOTE — Progress Notes (Signed)
Patient ID: ELLAKATE [name], female   DOB: 1966/04/10, 48 y.o.   MRN: [id number]   Subjective:    Patient ID: Sheila [name], female    DOB: [birth year], 48 y.o.   MRN: [id number]  Patient presents for F/U  Pt here to f/u Acute cystitis with hematuria, recent gallbladder removal where she had foley catheter, pt seen 12/14, treated for UTI with keflex initially but culture came back with 30,000 of Coag neg staph/ resistant to [place] which may have been just a contaminent but she was quite symptomatic so I changed her to Doxycycline. She did improve. Here for repeat UA and culture. No current symptoms  Note also seen for panic and anxiety, she has been less stressed since her break for the holiday but did get worked up when her boys got into an alteraction and had another episode of the epigastric pain that she was able to breathe herself out of    Review Of Systems:  GEN- denies fatigue, fever, weight loss,weakness, recent illness HEENT- denies eye drainage, change in vision, nasal discharge, CVS- denies chest pain, palpitations RESP- denies SOB, cough, wheeze ABD- denies N/V, change in stools, abd pain GU- denies dysuria, hematuria, dribbling, incontinence MSK- denies joint pain, muscle aches, injury Neuro- denies headache, dizziness, syncope, seizure activity       Objective:    BP 110/62 mmHg  Pulse 64  Temp(Src) 98.5 F (36.9 C) (Oral)  Resp 12  Ht 5\' 3"  (1.6 m)  Wt 130 lb (58.968 kg)  BMI 23.03 kg/m2 GEN- NAD, alert and oriented x3 ABD-NABS,soft,NT,ND, no CVA tenderness EXT- No edema Psych- normal affect and mood        Assessment & Plan:      Problem List Items Addressed This Visit    None    Visit Diagnoses    Hematuria    -  Primary    S/P treatment, now resolved, will repeat culture to make sure everything is clear    Relevant Orders       Urinalysis, Routine w reflex microscopic (Completed)       Urine culture       Note: This dictation was prepared  with Dragon dictation along with smaller phrase technology. Any transcriptional errors that result from this process are unintentional.

## 2014-05-03 LAB — URINE CULTURE
Colony Count: NO GROWTH
ORGANISM ID, BACTERIA: NO GROWTH

## 2014-06-07 ENCOUNTER — Telehealth: Payer: Self-pay | Admitting: Obstetrics and Gynecology

## 2014-06-07 NOTE — Telephone Encounter (Signed)
Left message regarding upcoming cx 06/27/14@2 :00 ngyn (wait list)

## 2014-07-25 ENCOUNTER — Encounter: Payer: BC Managed Care – PPO | Admitting: Obstetrics and Gynecology

## 2014-08-01 ENCOUNTER — Encounter: Payer: Self-pay | Admitting: Obstetrics and Gynecology

## 2014-08-01 ENCOUNTER — Ambulatory Visit (INDEPENDENT_AMBULATORY_CARE_PROVIDER_SITE_OTHER): Payer: BC Managed Care – PPO | Admitting: Obstetrics and Gynecology

## 2014-08-01 VITALS — BP 112/78 | HR 80 | Resp 16 | Ht 62.75 in | Wt 130.8 lb

## 2014-08-01 DIAGNOSIS — Z0000 Encounter for general adult medical examination without abnormal findings: Secondary | ICD-10-CM | POA: Diagnosis not present

## 2014-08-01 DIAGNOSIS — Z23 Encounter for immunization: Secondary | ICD-10-CM | POA: Diagnosis not present

## 2014-08-01 DIAGNOSIS — N8110 Cystocele, unspecified: Secondary | ICD-10-CM

## 2014-08-01 DIAGNOSIS — N3941 Urge incontinence: Secondary | ICD-10-CM

## 2014-08-01 DIAGNOSIS — Z01419 Encounter for gynecological examination (general) (routine) without abnormal findings: Secondary | ICD-10-CM

## 2014-08-01 DIAGNOSIS — N951 Menopausal and female climacteric states: Secondary | ICD-10-CM | POA: Diagnosis not present

## 2014-08-01 DIAGNOSIS — R5383 Other fatigue: Secondary | ICD-10-CM | POA: Diagnosis not present

## 2014-08-01 DIAGNOSIS — IMO0002 Reserved for concepts with insufficient information to code with codable children: Secondary | ICD-10-CM

## 2014-08-01 LAB — CBC
HEMATOCRIT: 36.6 % (ref 36.0–46.0)
Hemoglobin: 12.1 g/dL (ref 12.0–15.0)
MCH: 30.3 pg (ref 26.0–34.0)
MCHC: 33.1 g/dL (ref 30.0–36.0)
MCV: 91.5 fL (ref 78.0–100.0)
MPV: 9.8 fL (ref 8.6–12.4)
Platelets: 267 10*3/uL (ref 150–400)
RBC: 4 MIL/uL (ref 3.87–5.11)
RDW: 13.2 % (ref 11.5–15.5)
WBC: 6.3 10*3/uL (ref 4.0–10.5)

## 2014-08-01 LAB — LIPID PANEL
CHOLESTEROL: 166 mg/dL (ref 0–200)
HDL: 45 mg/dL — ABNORMAL LOW (ref >=46)
LDL Cholesterol: 94 mg/dL (ref 0–99)
TRIGLYCERIDES: 134 mg/dL (ref <150)
Total CHOL/HDL Ratio: 3.7 Ratio
VLDL: 27 mg/dL (ref 0–40)

## 2014-08-01 LAB — POCT URINALYSIS DIPSTICK
Leukocytes, UA: NEGATIVE
Urobilinogen, UA: NEGATIVE
pH, UA: 5

## 2014-08-01 LAB — TSH: TSH: 1.186 u[IU]/mL (ref 0.350–4.500)

## 2014-08-01 LAB — IRON: IRON: 87 ug/dL (ref 42–145)

## 2014-08-01 MED ORDER — SOLIFENACIN SUCCINATE 5 MG PO TABS: 5.0000 mg | ORAL_TABLET | Freq: Every day | ORAL | Status: DC

## 2014-08-01 MED ORDER — ESTROGENS, CONJUGATED 0.625 MG/GM VA CREA: 1.0000 | TOPICAL_CREAM | Freq: Every day | VAGINAL | Status: DC

## 2014-08-01 NOTE — Patient Instructions (Signed)

## 2014-08-01 NOTE — Progress Notes (Signed)
49 y.o. [id number] MarriedCaucasianF here for annual exam.    Worried she has recurrent prolapse.  Feels something different for the last 4 - 5 months.  Not having intercourse due to fear of the prolapse.   Also having hot flashes and night sweats.  Fatigue.   Leaks urine if has urgency.  Not voiding often, but if she voids more often, it takes the urge feeling away.  2 Diet Cokes per day.  No leak with cough or laugh or sneezes.   Status post hysterectomy 2011 for fibroids and adenomyosis.  LAVH/anterior and posterior colporrhaphy, [facility] sling and cystoscopy 07/11/09.   Ovaries remain.  Prior bladder tack by Dr. Matilde Sprang.  Prior collagen injection.   Laparoscopic chole in November 2015.   Paternal aunt with ovarian cancer.  Patient is worried about ovarian cancer.   Working in Engineer, maintenance.  Very stressful.  Takes Lexapro and Xanax rarely.   Sees Dr. Buelah Manis at [facility].   Patient's last menstrual period was 05/06/2009.          Sexually active: Yes.    The current method of family planning is TVH.    Exercising: No.  The patient does not participate in regular exercise at present. Smoker:  no  Health Maintenance: Pap:  09/27/10 Negative  History of abnormal Pap:  Yes has had hx of LEEP 10 years ago  MMG:  12/27/13 Bi-Rads 1: Negative  Colonoscopy: 07/2013 f/u in 10 years  BMD:   Never had one  TDaP:  06/30/2003 Screening Labs: yes , Hb today: 11.9 , Urine today: Negative    reports that she has never smoked. She has never used smokeless tobacco. She reports that she does not drink alcohol or use illicit drugs.  Past Medical History  Diagnosis Date  . Allergy     allergic rhinitis  . Anxiety   . Hyperlipidemia   . Acne   . Depression     Past Surgical History  Procedure Laterality Date  . Appendectomy    . Bladder surgery  03/09  . Incontinence surgery    . Abdominal hysterectomy      partial hyst cystocele and rectocele  .  Bladder tack      2nd bladder surgery  . Cholecystectomy      Current Outpatient Prescriptions  Medication Sig Dispense Refill  . ALPRAZolam (XANAX) 0.5 MG tablet Take 1 tablet (0.5 mg total) by mouth 2 (two) times daily as needed for anxiety. 45 tablet 2  . escitalopram (LEXAPRO) 20 MG tablet Take 20 mg by mouth daily.    . famotidine (PEPCID) 20 MG tablet Take 1 tablet (20 mg total) by mouth 2 (two) times daily. (Patient not taking: Reported on 05/02/2014) 60 tablet 0  . hydrochlorothiazide (MICROZIDE) 12.5 MG capsule Take 12.5 mg by mouth daily as needed (for swelling).    . pantoprazole (PROTONIX) 40 MG tablet Take 40 mg by mouth daily.     No current facility-administered medications for this visit.    Family History  Problem Relation Age of Onset  . Diabetes Father   . Breast cancer Neg Hx   . Colon cancer Neg Hx   . Diabetes Maternal Grandmother   . Uterine cancer Paternal Aunt     ROS:  Pertinent items are noted in HPI.  Otherwise, a comprehensive ROS was negative.  Exam:   BP 112/78 mmHg  Pulse 80  Resp 16  Ht 5' 2.75" (1.594 m)  Wt 130 lb  12.8 oz (59.33 kg)  BMI 23.35 kg/m2  LMP 05/06/2009      Height: 5' 2.75" (159.4 cm)  Ht Readings from Last 3 Encounters:  08/01/14 5' 2.75" (1.594 m)  05/02/14 5\' 3"  (1.6 m)  04/18/14 5\' 3"  (1.6 m)    General appearance: alert, cooperative and appears stated age Head: Normocephalic, without obvious abnormality, atraumatic Neck: no adenopathy, supple, symmetrical, trachea midline and thyroid normal to inspection and palpation Lungs: clear to auscultation bilaterally Breasts: normal appearance, no masses or tenderness, Inspection negative, No nipple retraction or dimpling, No nipple discharge or bleeding, No axillary or supraclavicular adenopathy Heart: regular rate and rhythm Abdomen: scattered laparoscopic incisions, soft, non-tender; bowel sounds normal; no masses,  no organomegaly Extremities: extremities normal,  atraumatic, no cyanosis or edema Skin: Skin color, texture, turgor normal. No rashes or lesions Lymph nodes: Cervical, supraclavicular, and axillary nodes normal. No abnormal inguinal nodes palpated Neurologic: Grossly normal  Pelvic: External genitalia:  no lesions              Urethra:  normal appearing urethra with no masses, tenderness or lesions              Bartholins and Skenes: normal                 Vagina: normal appearing vagina with normal color and discharge, no lesions.  First degree cystocele, good apical support, no rectocele.               Cervix: absent              Pap taken: Yes.   Bimanual Exam:  Uterus:  uterus absent              Adnexa: normal adnexa and no mass, fullness, tenderness               Rectovaginal: Confirms               Anus:  normal sphincter tone, no lesions  Chaperone was present for exam.  A:  [facility] Woman visit.  Status post LAVH. Status post LEEP. Status post anterior colporrhaphy times two.  First degree cystocele. Status post transobturator sling.  Urge incontinence.  Menopausal symptoms.  Fatigue.  Second degree relative with ovarian cancer.   P:   Mammogram yearly.  Will do at the [facility] [place].  pap smear and HR HPV performed.  Discussion of recurrent prolapse and potential treatment with surgical graft - biological versus pessary use.  Declines ERT. Will start vaginal Premarin cream.  See orders.  Discussed risk of MI, stroke, DVT, PE, breast cancer.  Reduce bladder irritants.  Vesicare 5 mg daily.  Discussed risks, benefits. Sexual activity OK.  Discussed benefit of maintaining ovaries at this time. Return in 6 weeks for recheck.  Routine labs including TSH and iron level.  TDap vaccine.  return annually or prn.  Additional  minutes face to face time discussing cystocele, fatigue, menopausal symptoms, urinary urgency incontinence - over 50% was spent in counseling.

## 2014-08-02 LAB — HEMOGLOBIN, FINGERSTICK: HEMOGLOBIN, FINGERSTICK: 11.9 g/dL — AB (ref 12.0–16.0)

## 2014-08-03 LAB — IPS PAP TEST WITH HPV

## 2014-08-10 ENCOUNTER — Encounter: Payer: BC Managed Care – PPO | Admitting: Obstetrics and Gynecology

## 2014-08-27 NOTE — H&P (Signed)
PATIENT NAME:  Sheila [name], Sheila [name] MR#:  [id number] DATE OF BIRTH:  1965-09-17  DATE OF ADMISSION:  03/27/2014  CHIEF COMPLAINT: Right upper quadrant pain.   HISTORY OF PRESENT ILLNESS: This is a patient who was awoken from sleep at 2 a.m. with abdominal pain. She points to her epigastrium and right upper quadrant. She has no back pain, but she had multiple episodes like this before, most of them short-lived. This was following a somewhat fatty meal at a [employer] last night. She has been recently treated for Helicobacter pylori and reflux disease, but continues to have these episodic attacks often associated with fatty food intolerance. She had loose stools early this morning. Denies melena or hematochezia. No fevers or chills. She is nauseated, but has not vomited.   PAST MEDICAL HISTORY: Helicobacter pylori and reflux disease.   PAST SURGICAL HISTORY: Two bladder slings, hysterectomy, rectocele repair and appendectomy.   ALLERGIES: None.   MEDICATIONS: Pantoprazole and an antianxiety medication.   FAMILY HISTORY: Noncontributory.   SOCIAL HISTORY: The patient is a school principal. She does not smoke or drink.   REVIEW OF SYSTEMS: Ten system review is performed and negative with the exception of that mentioned in the HPI.   PHYSICAL EXAMINATION:  GENERAL: Healthy-appearing female patient.  VITAL SIGNS: Stable. She is afebrile.  HEENT: Shows no scleral icterus.  NECK: No palpable neck nodes.  CHEST: Clear to auscultation.  CARDIAC: Regular rate and rhythm.  ABDOMEN: Soft. There is tenderness in the right upper quadrant with a minimal Murphy's sign.  EXTREMITIES: Without edema.  NEUROLOGIC: Grossly intact.  INTEGUMENT: Shows no jaundice.   LABORATORY AND RADIOLOGIC DATA: An ultrasound shows stones with some minimal wall thickening, possible pericholecystic fluid.   LFTs and lipase are within normal limits as are electrolytes. White blood cell count is 9.4. Hemoglobin and  hematocrit of 13.9 and 42.   ASSESSMENT AND PLAN: This is a patient with right upper quadrant pain, multiple episodes. This may represent acute cholecystitis, but certainly represents fairly severe and recurrent biliary colic. I offered admission to the hospital with laparoscopic cholecystectomy in the next day or 2 versus elective visit to the office with evaluation and scheduling at her convenience. She prefers to come in the hospital, at this point, and have the surgery performed, and I am in agreement because there is some component of acute cholecystitis, certainly on ultrasound and examination. The procedure was described for her. The options of observation were reviewed. The risks of bleeding, infection, recurrence of symptoms, failure to resolve all of her symptoms as she has Helicobacter pylori. Also the risks of conversion to an open procedure, bile duct damage, bile duct leak, bowel injury, retained common bile duct stone, any of which could require further surgery and/or endoscopic retrograde cholangiopancreatography, stent and papillotomy. This was all reviewed for her and her husband. They understood and agreed to proceed.    ____________________________ Jerrol Banana Burt Knack, MD rec:JT D: 03/27/2014 10:08:57 ET T: 03/27/2014 12:32:58 ET JOB#: [id number]  cc: Jerrol Banana. Burt Knack, MD, <Dictator> Florene Glen MD ELECTRONICALLY SIGNED 03/27/2014 16:10

## 2014-08-27 NOTE — Op Note (Signed)
PATIENT NAME:  Sheila [name], Sheila [name] MR#:  [id number] DATE OF BIRTH:  1965-11-02  DATE OF PROCEDURE:  03/27/2014  PREOPERATIVE DIAGNOSIS: Acute cholecystitis.   POSTOPERATIVE DIAGNOSIS: Acute cholecystitis.   PROCEDURE: Laparoscopic cholecystectomy.   SURGEON: Phoebe Perch, MD   ANESTHESIA: General with endotracheal tube.   INDICATIONS: This is a patient with unrelenting right upper quadrant pain and workup showing gallstones and probable acute cholecystitis. Preoperatively, we discussed rationale for surgery, the options of observation, risk of bleeding, infection, recurrence of symptoms, failure to resolve all of her symptoms, open procedure, bile duct damage, bile duct leak, retained common bile duct stone, any of which could require further surgery and/or ERCP, stent, and papillotomy. This was all reviewed for her and her husband. They understood and agreed to proceed.   FINDINGS: Acute cholecystitis with edema of the gallbladder.   DESCRIPTION OF PROCEDURE: The patient was induced under general anesthesia, given IV antibiotics and VTE prophylaxis was in place. She was prepped and draped in a sterile fashion. Marcaine was infiltrated in skin and subcutaneous tissues around the supraumbilical area, avoiding infraumbilical scars. An incision was made, Veress needle was placed, pneumoperitoneum was obtained and a 5 mm trocar port was placed. The abdominal cavity was explored, and under direct vision a 12 mm epigastric port and two lateral 5 mm ports were placed. The gallbladder was placed on tension. The peritoneum over the infundibulum was incised bluntly. The cystic duct-gallbladder junction was well identified. The cystic artery was well identified, doubly clipped and divided. This allowed for good visualization of the cystic duct as it entered the infundibulum of the gallbladder. Here, it was doubly clipped and divided, and the gallbladder was taken from the gallbladder fossa with electrocautery and  passed out through the epigastric port site with the aid of an Endo Catch bag. The area was checked for hemostasis, found to be adequate. The area was irrigated with copious amounts of normal saline which was aspirated. Again, hemostasis was adequate. There was no sign of bile leak or bowel injury. The camera was placed in the epigastric site to view back to the periumbilical site. There were no adhesions in the infraumbilical area from prior surgery and no sign of bowel injury. Therefore, pneumoperitoneum was released. All ports were removed. Fascial edges at the epigastric site were approximated with figure-of-eight 0 Vicryls; 4-0 subcuticular Monocryl was used on all skin edges. Steri-Strips, Mastisol and sterile dressings were placed.   The patient tolerated the procedure well. There were no complications. She was taken to the recovery room in stable condition to be admitted for continued care.    ____________________________ Jerrol Banana. Burt Knack, MD rec:TT D: 03/27/2014 12:06:50 ET T: 03/27/2014 17:45:03 ET JOB#: [id number]  cc: Jerrol Banana. Burt Knack, MD, <Dictator> Florene Glen MD ELECTRONICALLY SIGNED 03/27/2014 18:40

## 2014-08-27 NOTE — H&P (Signed)
Subjective/Chief Complaint RUQ pain   History of Present Illness pain nausea, no emesis mult prior episodes after fatty meals recent treatment for H pylori no f/c loose stool today   Past History GERD anxiety   Past Med/Surgical Hx:  GERD:   Other, see comments: no hx  Hysterectomy - Partial:   blaader sling:   Appendectomy:   ALLERGIES:  No Known Allergies:   Family and Social History:  Family History Non-Contributory   Social History negative tobacco, negative ETOH, school principal   Place of Living Home   Review of Systems:  Fever/Chills No   Cough No   Abdominal Pain Yes   Diarrhea Yes   Constipation No   Nausea/Vomiting Yes   SOB/DOE No   Chest Pain No   Dysuria No   Tolerating Diet No  Nauseated   Medications/Allergies Reviewed Medications/Allergies reviewed   Physical Exam:  GEN no acute distress   HEENT pink conjunctivae   NECK supple   RESP normal resp effort  clear BS   CARD regular rate   ABD positive tenderness  min Murphy's sign   LYMPH negative neck   EXTR negative edema   SKIN normal to palpation   PSYCH alert, A+O to time, place, person, good insight   Lab Results: Hepatic:  22-Nov-15 05:59   Bilirubin, Total 0.3  Alkaline Phosphatase 82 (46-116 NOTE: New Reference Range 11/23/13)  SGPT (ALT) 22 (14-63 NOTE: New Reference Range 11/23/13)  SGOT (AST) 27  Total Protein, Serum 7.4  Albumin, Serum 3.9  Routine Chem:  22-Nov-15 05:59   Glucose, Serum  117  BUN 12  Creatinine (comp) 0.77  Sodium, Serum 142  Potassium, Serum 3.7  Chloride, Serum  108  CO2, Serum 25  Calcium (Total), Serum 9.5  Osmolality (calc) 284  eGFR (African American) >60  eGFR (Non-African American) >60 (eGFR values <72m/min/1.73 m2 may be an indication of chronic kidney disease (CKD). Calculated eGFR, using the MRDR Study equation, is useful in  patients with stable renal function. The eGFR calculation will not be reliable in  acutely ill patients when serum creatinine is changing rapidly. It is not useful in patients on dialysis. The eGFR calculation may not be applicable to patients at the low and high extremes of body sizes, pregnant women, and vegetarians.)  Anion Gap 9  Lipase 132 (Result(s) reported on 27 Mar 2014 at 06:24AM.)  Cardiac:  22-Nov-15 05:59   Troponin I < 0.02 (0.00-0.05 0.05 ng/mL or less: NEGATIVE  Repeat testing in 3-6 hrs  if clinically indicated. >0.05 ng/mL: POTENTIAL  MYOCARDIAL INJURY. Repeat  testing in 3-6 hrs if  clinically indicated. NOTE: An increase or decrease  of 30% or more on serial  testing suggests a  clinically important change)  Routine UA:  22-Nov-15 05:59   Color (UA) Straw  Clarity (UA) Clear  Glucose (UA) Negative  Bilirubin (UA) Negative  Ketones (UA) Negative  Specific Gravity (UA) 1.008  Blood (UA) Negative  pH (UA) 6.0  Protein (UA) Negative  Nitrite (UA) Negative  Leukocyte Esterase (UA) Negative (Result(s) reported on 27 Mar 2014 at 08:45AM.)  RBC (UA) <1 /HPF  WBC (UA) 1 /HPF  Bacteria (UA) 2+  Epithelial Cells (UA) 1 /HPF  Mucous (UA) PRESENT (Result(s) reported on 27 Mar 2014 at 08:45AM.)  Routine Hem:  22-Nov-15 05:59   WBC (CBC) 9.4  RBC (CBC) 4.56  Hemoglobin (CBC) 13.9  Hematocrit (CBC) 42.0  Platelet Count (CBC) 252  MCV 92  MCH  30.5  MCHC 33.1  RDW 13.0  Neutrophil % 76.7  Lymphocyte % 15.6  Monocyte % 6.2  Eosinophil % 1.3  Basophil % 0.2  Neutrophil #  7.2  Lymphocyte # 1.5  Monocyte # 0.6  Eosinophil # 0.1  Basophil # 0.0 (Result(s) reported on 27 Mar 2014 at 06:15AM.)   Radiology Results: [place]:    22-Nov-15 07:06, US Abdomen Limited Survey  US Abdomen Limited Survey  REASON FOR EXAM:    RUQ pain  COMMENTS:   Body Site: Gallbladder, Liver, Common Bile Duct    PROCEDURE: [place]  - US ABDOMEN LIMITED SURVEY  - Mar 27 2014  7:06AM     CLINICAL DATA:  Epigastric and right upper quadrant pain 5 hr.    EXAM:  US  ABDOMEN LIMITED - RIGHT UPPER QUADRANT    COMPARISON:  None.    FINDINGS:  Gallbladder:  Moderate cholelithiasis and mild gallbladder sludge. 9 mm stone over  the gallbladder neck. Mild gallbladder wall thickening measuring 3.3  mm. Small amount of pericholecystic fluid. Sonographic Murphy's sign  unreliable as patient is medicated for pain.    Common bile duct:    Diameter: 4.8 mm.    Liver:    No focal lesion identified. Within normal limits in parenchymal  echogenicity.     IMPRESSION:  Moderate cholelithiasis with mild gallbladder wall thickening and  small amount of pericholecystic fluid. Findings may be due to acute  cholecystitis.      Electronically Signed    By: Marin Olp M.D.    On: 03/27/2014 07:25         Verified By: Pearletha Alfred, M.D.,    Assessment/Admission Diagnosis gallstones ac chole rec lap chole risks options   Electronic Signatures: Florene Glen (MD)  (Signed [id number] 09:31)  Authored: CHIEF COMPLAINT and HISTORY, PAST MEDICAL/SURGIAL HISTORY, ALLERGIES, FAMILY AND SOCIAL HISTORY, REVIEW OF SYSTEMS, PHYSICAL EXAM, LABS, Radiology, ASSESSMENT AND PLAN   Last Updated: 22-Nov-15 09:31 by Florene Glen (MD)

## 2014-08-29 LAB — SURGICAL PATHOLOGY

## 2014-09-12 ENCOUNTER — Ambulatory Visit: Payer: BC Managed Care – PPO | Admitting: Obstetrics and Gynecology

## 2014-09-14 ENCOUNTER — Encounter: Payer: Self-pay | Admitting: Obstetrics and Gynecology

## 2014-09-14 ENCOUNTER — Ambulatory Visit (INDEPENDENT_AMBULATORY_CARE_PROVIDER_SITE_OTHER): Payer: BC Managed Care – PPO | Admitting: Obstetrics and Gynecology

## 2014-09-14 VITALS — BP 116/60 | HR 68 | Ht 62.75 in | Wt 131.4 lb

## 2014-09-14 DIAGNOSIS — R3915 Urgency of urination: Secondary | ICD-10-CM

## 2014-09-14 DIAGNOSIS — N952 Postmenopausal atrophic vaginitis: Secondary | ICD-10-CM | POA: Diagnosis not present

## 2014-09-14 MED ORDER — SOLIFENACIN SUCCINATE 5 MG PO TABS: 5.0000 mg | ORAL_TABLET | Freq: Every day | ORAL | Status: DC

## 2014-09-14 NOTE — Progress Notes (Signed)
GYNECOLOGY  VISIT   HPI: 49 y.o.   Married  Caucasian  female   G2P2002 with Patient's last menstrual period was 05/06/2009.   here for   Recheck of Premarin cream and Vesicare. Had urinary urgency but is doing well now on Vesicare 5 mg daily.  Not having pulling feeling or prolapse like symptoms.  Using Premarin cream 1/2 gram twice a week.   Did physical therapy with biofeedback in the past at [facility].  Worried about recurrent prolapse.   GYNECOLOGIC HISTORY: Patient's last menstrual period was 05/06/2009. Contraception: Hysterectomy  Menopausal hormone therapy: N/A Last mammogram: 12/27/13 Bi-Rads 1: Negative Last pap smear: 08/01/14 wnl neg HR HPV        OB History    Gravida Para Term Preterm AB TAB SAB Ectopic Multiple Living   2 2 2       2          Patient Active Problem List   Diagnosis Date Noted  . Peripheral edema 01/17/2014  . IBS (irritable bowel syndrome) 06/15/2013  . URI, acute 03/25/2013  . Migraine headache 02/21/2013  . Dizziness 01/27/2013  . Depression 10/13/2012  . CONSTIPATION 07/11/2010  . GERD (gastroesophageal reflux disease) 03/23/2007  . HYPERCHOLESTEROLEMIA, PURE 10/14/2006  . GAD (generalized anxiety disorder) 10/13/2006  . ALLERGIC RHINITIS 10/13/2006  . STRESS INCONTINENCE 10/13/2006    Past Medical History  Diagnosis Date  . Allergy     allergic rhinitis  . Anxiety   . Hyperlipidemia   . Acne   . Depression     Past Surgical History  Procedure Laterality Date  . Appendectomy    . Bladder surgery  03/09  . Incontinence surgery    . Abdominal hysterectomy  07/11/2009    partial hyst cystocele and rectocele  . Bladder tack      2nd bladder surgery  . Cholecystectomy      Current Outpatient Prescriptions  Medication Sig Dispense Refill  . ALPRAZolam (XANAX) 0.5 MG tablet Take 1 tablet (0.5 mg total) by mouth 2 (two) times daily as needed for anxiety. 45 tablet 2  . conjugated estrogens (PREMARIN) vaginal cream Place  1 Applicatorful vaginally daily. Use 1/2 g vaginally every night at bed time for 2 weeks, then use 1/2 g vaginally two times per week. 60 g 2  . escitalopram (LEXAPRO) 20 MG tablet Take 20 mg by mouth daily.    . famotidine (PEPCID) 20 MG tablet Take 1 tablet (20 mg total) by mouth 2 (two) times daily. (Patient not taking: Reported on 05/02/2014) 60 tablet 0  . hydrochlorothiazide (MICROZIDE) 12.5 MG capsule Take 12.5 mg by mouth daily as needed (for swelling).    . pantoprazole (PROTONIX) 40 MG tablet Take 40 mg by mouth daily.    . solifenacin (VESICARE) 5 MG tablet Take 1 tablet (5 mg total) by mouth daily. 30 tablet 2   No current facility-administered medications for this visit.     ALLERGIES: Ciprofloxacin  Family History  Problem Relation Age of Onset  . Diabetes Father   . Breast cancer Neg Hx   . Colon cancer Neg Hx   . Diabetes Maternal Grandmother   . Uterine cancer Paternal Aunt     History   Social History  . Marital Status: Married    Spouse Name: N/A  . Number of Children: N/A  . Years of Education: N/A   Occupational History  . Not on file.   Social History Main Topics  . Smoking status: Never  Smoker   . Smokeless tobacco: Never Used  . Alcohol Use: No  . Drug Use: No  . Sexual Activity:    Partners: Male    Birth Control/ Protection: Surgical   Other Topics Concern  . Not on file   Social History Narrative   [place]   2 kids   Married 1989    ROS:  Pertinent items are noted in HPI.  PHYSICAL EXAMINATION:    Ht 5' 2.75" (1.594 m)  LMP 05/06/2009     General appearance: alert, cooperative and appears stated age  Pelvic: External genitalia:  no lesions              Urethra:  normal appearing urethra with no masses, tenderness or lesions              Bartholins and Skenes: normal                 Vagina: normal appearing vagina with normal color and discharge, no lesions.  Minimal cystocele.               Cervix:   absent                   Bimanual Exam:  Uterus:  absent                                      Adnexa: normal adnexa in size, nontender and no masses                                     ASSESSMENT  Minimal cystocele. Urinary urgency improved with Premarin vaginal cream and Vesicare.    PLAN Continue Premarin 1/2 gm pv 2 - 3 x per week.  Can use externally also.  Vesicare 5 mg daily.  See orders.  Offered to send back for pelvic floor PT.  Patient currently declines.  Avoid straining for BMs and heavy lifting.  Follow up prn.     An After Visit Summary was printed and given to the patient.  __15____ minutes face to face time of which over 50% was spent in counseling.

## 2014-09-22 ENCOUNTER — Encounter: Payer: Self-pay | Admitting: Obstetrics and Gynecology

## 2014-12-01 ENCOUNTER — Other Ambulatory Visit: Payer: Self-pay | Admitting: Family Medicine

## 2014-12-01 NOTE — Telephone Encounter (Signed)
Medication filled x1 with no refills.   Requires office visit before any further refills can be given.   Letter sent.  

## 2014-12-06 ENCOUNTER — Other Ambulatory Visit: Payer: Self-pay

## 2014-12-06 DIAGNOSIS — Z1231 Encounter for screening mammogram for malignant neoplasm of breast: Secondary | ICD-10-CM

## 2014-12-26 ENCOUNTER — Other Ambulatory Visit: Payer: Self-pay | Admitting: Family Medicine

## 2014-12-26 NOTE — Telephone Encounter (Signed)
Refill appropriate and filled per protocol. 

## 2014-12-29 ENCOUNTER — Other Ambulatory Visit: Payer: Self-pay | Admitting: Family Medicine

## 2014-12-29 NOTE — Telephone Encounter (Signed)
Refill request denied.   Prescription was refilled on 12/26/2014.

## 2015-01-13 ENCOUNTER — Ambulatory Visit
Admission: RE | Admit: 2015-01-13 | Discharge: 2015-01-13 | Disposition: A | Discharge status: Home / Self Care | Payer: BC Managed Care – PPO | Source: Ambulatory Visit

## 2015-01-13 ENCOUNTER — Encounter: Payer: Self-pay | Admitting: Family Medicine

## 2015-01-13 ENCOUNTER — Ambulatory Visit (INDEPENDENT_AMBULATORY_CARE_PROVIDER_SITE_OTHER): Payer: BC Managed Care – PPO | Admitting: Family Medicine

## 2015-01-13 VITALS — BP 116/74 | HR 64 | Temp 98.3°F | Resp 12 | Ht 63.0 in | Wt 130.0 lb

## 2015-01-13 DIAGNOSIS — K219 Gastro-esophageal reflux disease without esophagitis: Secondary | ICD-10-CM | POA: Diagnosis not present

## 2015-01-13 DIAGNOSIS — Z1231 Encounter for screening mammogram for malignant neoplasm of breast: Secondary | ICD-10-CM

## 2015-01-13 DIAGNOSIS — G43809 Other migraine, not intractable, without status migrainosus: Secondary | ICD-10-CM

## 2015-01-13 DIAGNOSIS — F411 Generalized anxiety disorder: Secondary | ICD-10-CM | POA: Diagnosis not present

## 2015-01-13 DIAGNOSIS — Z23 Encounter for immunization: Secondary | ICD-10-CM

## 2015-01-13 DIAGNOSIS — F329 Major depressive disorder, single episode, unspecified: Secondary | ICD-10-CM | POA: Diagnosis not present

## 2015-01-13 DIAGNOSIS — F32A Depression, unspecified: Secondary | ICD-10-CM

## 2015-01-13 MED ORDER — PANTOPRAZOLE SODIUM 40 MG PO TBEC: 40.0000 mg | DELAYED_RELEASE_TABLET | Freq: Every day | ORAL | Status: DC

## 2015-01-13 MED ORDER — SUMATRIPTAN SUCCINATE 100 MG PO TABS: 100.0000 mg | ORAL_TABLET | ORAL | Status: DC | PRN

## 2015-01-13 MED ORDER — ALPRAZOLAM 0.5 MG PO TABS: 0.5000 mg | ORAL_TABLET | Freq: Two times a day (BID) | ORAL | Status: DC | PRN

## 2015-01-13 MED ORDER — ESCITALOPRAM OXALATE 20 MG PO TABS: 20.0000 mg | ORAL_TABLET | Freq: Every day | ORAL | Status: DC

## 2015-01-13 NOTE — Assessment & Plan Note (Signed)
Continue protonix, symptoms controlled on this

## 2015-01-13 NOTE — Patient Instructions (Signed)
Continue current medications  Try the imitrex for the migraines Flu shot given  F/U April 2017

## 2015-01-13 NOTE — Assessment & Plan Note (Signed)
Continue lexapro and xanax, plan to try to taper off after

## 2015-01-13 NOTE — Assessment & Plan Note (Signed)
imitrex given

## 2015-01-13 NOTE — Progress Notes (Signed)
Patient ID: Sheila [name], female   DOB: 05/27/1965, 49 y.o.   MRN: [id number]   Subjective:    Patient ID: Sheila [name], female    DOB: 08-03-1965, 49 y.o.   MRN: [id number]  Patient presents for Medication Review/ Refills  patient here to follow-up medications. States that she is doing well. She has less than 6 months before she will retire a lot of her stress has been lifted off her. She would like to continue with her Lexapro and her Xanax until she is through with retirement. She was seen by her OB/GYN recently she had labs done which I reviewed her cholesterol was much improved. She is still on topical estrogenic as needed. Her only other concern was migraine she does get migraines every now and then has tried  Ibuprofen and Tylenol but these do not help she wanted to try the old migraine medicine she had which was Imitrex    Review Of Systems:  GEN- denies fatigue, fever, weight loss,weakness, recent illness HEENT- denies eye drainage, change in vision, nasal discharge, CVS- denies chest pain, palpitations RESP- denies SOB, cough, wheeze ABD- denies N/V, change in stools, abd pain GU- denies dysuria, hematuria, dribbling, incontinence MSK- denies joint pain, muscle aches, injury Neuro-+ headache, dizziness, syncope, seizure activity       Objective:    BP 116/74 mmHg  Pulse 64  Temp(Src) 98.3 F (36.8 C) (Oral)  Resp 12  Ht 5\' 3"  (1.6 m)  Wt 130 lb (58.968 kg)  BMI 23.03 kg/m2  LMP 05/06/2009 GEN- NAD, alert and oriented x3 HEENT- PERRL, EOMI, non injected sclera, pink conjunctiva, MMM, oropharynx clear Neck- Supple, no thyromegaly CVS- RRR, no murmur RESP-CTAB ABD-NABS,soft,NT,ND Psych- normal affect and mood EXT- No edema Pulses- Radial, DP- 2+        Assessment & Plan:      Problem List Items Addressed This Visit    Migraine headache   Relevant Medications   escitalopram (LEXAPRO) 20 MG tablet   SUMAtriptan (IMITREX) 100 MG tablet    Other Visit  Diagnoses    Need for prophylactic vaccination and inoculation against influenza    -  Primary    Relevant Orders    Flu Vaccine QUAD 36+ mos PF IM (Fluarix & Fluzone Quad PF) (Completed)       Note: This dictation was prepared with Dragon dictation along with smaller phrase technology. Any transcriptional errors that result from this process are unintentional.

## 2015-05-07 DIAGNOSIS — R7989 Other specified abnormal findings of blood chemistry: Secondary | ICD-10-CM

## 2015-05-07 DIAGNOSIS — R7309 Other abnormal glucose: Secondary | ICD-10-CM

## 2015-05-07 HISTORY — DX: Other abnormal glucose: R73.09

## 2015-05-07 HISTORY — DX: Other specified abnormal findings of blood chemistry: R79.89

## 2015-06-30 ENCOUNTER — Ambulatory Visit (INDEPENDENT_AMBULATORY_CARE_PROVIDER_SITE_OTHER): Payer: BC Managed Care – PPO | Admitting: Family Medicine

## 2015-06-30 ENCOUNTER — Encounter: Payer: Self-pay | Admitting: Family Medicine

## 2015-06-30 VITALS — BP 102/70 | HR 62 | Temp 98.1°F | Resp 12 | Ht 63.0 in | Wt 134.0 lb

## 2015-06-30 DIAGNOSIS — N390 Urinary tract infection, site not specified: Secondary | ICD-10-CM

## 2015-06-30 LAB — URINALYSIS, ROUTINE W REFLEX MICROSCOPIC
BILIRUBIN URINE: NEGATIVE
Glucose, UA: NEGATIVE
KETONES UR: NEGATIVE
NITRITE: POSITIVE — AB
PH: 6 (ref 5.0–8.0)
Protein, ur: NEGATIVE
Specific Gravity, Urine: 1.015 (ref 1.001–1.035)

## 2015-06-30 LAB — URINALYSIS, MICROSCOPIC ONLY
CASTS: NONE SEEN [LPF]
CRYSTALS: NONE SEEN [HPF]
YEAST: NONE SEEN [HPF]

## 2015-06-30 MED ORDER — CEPHALEXIN 500 MG PO CAPS: 500.0000 mg | ORAL_CAPSULE | Freq: Four times a day (QID) | ORAL | Status: DC

## 2015-06-30 MED ORDER — FLUCONAZOLE 150 MG PO TABS: 150.0000 mg | ORAL_TABLET | Freq: Once | ORAL | Status: DC

## 2015-06-30 MED ORDER — PHENAZOPYRIDINE HCL 100 MG PO TABS: 100.0000 mg | ORAL_TABLET | Freq: Three times a day (TID) | ORAL | Status: DC | PRN

## 2015-06-30 NOTE — Progress Notes (Signed)
Patient ID: Sheila [name], female   DOB: [birth year], 50 y.o.   MRN: [id number]   Subjective:    Patient ID: Sheila [name], female    DOB: [birth year], 50 y.o.   MRN: [id number]  Patient presents for [facility] here with all smell to her urine and some mild suprapubic pressure that started yesterday. She has history of recurrent urinary tract infections. She has has not been treated for one in greater than a year. She denies any fever. She also admits to some congestion and ear pain she has bilateral tubes in her ear she has not noted any drainage. She's not had any cough or congestion.    Review Of Systems:  GEN- denies fatigue, fever, weight loss,weakness, recent illness HEENT- denies eye drainage, change in vision, nasal discharge, CVS- denies chest pain, palpitations RESP- denies SOB, cough, wheeze ABD- denies N/V, change in stools, abd pain GU- +dysuria, hematuria, dribbling, incontinence Neuro- denies headache, dizziness, syncope, seizure activity       Objective:    BP 102/70 mmHg  Pulse 62  Temp(Src) 98.1 F (36.7 C) (Oral)  Resp 12  Ht 5\' 3"  (1.6 m)  Wt 134 lb (60.782 kg)  BMI 23.74 kg/m2  LMP 05/06/2009 GEN- NAD, alert and oriented x3 HEENT- PERRL, EOMI, non injected sclera, pink conjunctiva, MMM, oropharynx clear, TM clear,tubes in place, nares clear  Neck- Supple, no LAD  CVS- RRR, no murmur RESP-CTAB ABD-NABS,soft, mild TTP suprapubic region ,ND, no CVA tenderness         Assessment & Plan:      Problem List Items Addressed This Visit    None    Visit Diagnoses    UTI (lower urinary tract infection)    -  Primary    treat her with Keflex 4 times a day for 5 days culture will be sent, pyridium  May be some early allergies, no sign of infection in ears. Sinuses     Relevant Medications    cephALEXin (KEFLEX) 500 MG capsule    phenazopyridine (PYRIDIUM) 100 MG tablet    fluconazole (DIFLUCAN) 150 MG tablet    Other Relevant Orders    Urinalysis, Routine w reflex microscopic (not at [facility]) (Completed)    Urine culture       Note: This dictation was prepared with Dragon dictation along with smaller phrase technology. Any transcriptional errors that result from this process are unintentional.

## 2015-06-30 NOTE — Patient Instructions (Addendum)
Take antibiotics as prescribed Take the Pyrdium as prescribed will turn urine Orange/Red F/U In April

## 2015-07-02 ENCOUNTER — Encounter: Payer: Self-pay | Admitting: Family Medicine

## 2015-07-03 LAB — URINE CULTURE: Colony Count: >=100000

## 2015-07-10 ENCOUNTER — Encounter: Payer: Self-pay | Admitting: Family Medicine

## 2015-07-10 ENCOUNTER — Ambulatory Visit (INDEPENDENT_AMBULATORY_CARE_PROVIDER_SITE_OTHER): Payer: BC Managed Care – PPO | Admitting: Family Medicine

## 2015-07-10 VITALS — BP 126/62 | HR 70 | Temp 98.3°F | Resp 14 | Ht 63.0 in | Wt 130.0 lb

## 2015-07-10 DIAGNOSIS — N390 Urinary tract infection, site not specified: Secondary | ICD-10-CM

## 2015-07-10 LAB — URINALYSIS, ROUTINE W REFLEX MICROSCOPIC
BILIRUBIN URINE: NEGATIVE
Glucose, UA: NEGATIVE
HGB URINE DIPSTICK: NEGATIVE
KETONES UR: NEGATIVE
Leukocytes, UA: NEGATIVE
NITRITE: NEGATIVE
PH: 7 (ref 5.0–8.0)
Protein, ur: NEGATIVE
Specific Gravity, Urine: 1.02 (ref 1.001–1.035)

## 2015-07-10 MED ORDER — CEFTRIAXONE SODIUM 1 G IJ SOLR
1.0000 g | Freq: Once | INTRAMUSCULAR | Status: AC
  Administered 2015-07-10: 1 g via INTRAMUSCULAR

## 2015-07-10 NOTE — Progress Notes (Signed)
Patient ID: Sheila [name], female   DOB: [birth year], 50 y.o.   MRN: [id number]      Subjective:    Patient ID: Sheila [name], female    DOB: 06/21/1965, 50 y.o.   MRN: [id number]  Patient presents for Dysuria  patient with recurrent UTI symptoms. She was treated with UTI with Keflex 500 mg 4 times a day for 5 days last week she had Escherichia coli in her culture. She states her symptoms cleared up but then this morning she began having burning with a change in odor again. She's not had any fever no low back pain and no abdominal pain.    Review Of Systems:  GEN- denies fatigue, fever, weight loss,weakness, recent illness HEENT- denies eye drainage, change in vision, nasal discharge, CVS- denies chest pain, palpitations RESP- denies SOB, cough, wheeze ABD- denies N/V, change in stools, abd pain GU- +dysuria, denies  hematuria, dribbling, incontinence        Objective:    BP 126/62 mmHg  Pulse 70  Temp(Src) 98.3 F (36.8 C) (Oral)  Resp 14  Ht 5\' 3"  (1.6 m)  Wt 130 lb (58.968 kg)  BMI 23.03 kg/m2  LMP 05/06/2009 GEN- NAD, alert and oriented x3 CVS- RRR, no murmur RESP-CTAB ABD-NABS,soft mild suprapubic tenderness, no rebound, no guarding ND, no CVA tenderness Pulses- Radial  2+        Assessment & Plan:      Problem List Items Addressed This Visit    None    Visit Diagnoses    UTI (lower urinary tract infection)    -  Primary    possible recurrent symptoms or residual symptoms, UA look much better, send for culture, for now will give Rocephin 1gram injection until culture returns, increase water, cranberry Denies vaginitis symptoms    Relevant Medications    cefTRIAXone (ROCEPHIN) injection 1 g (Completed)    Other Relevant Orders    Urinalysis, Routine w reflex microscopic (not at [facility]) (Completed)    Urine culture       Note: This dictation was prepared with Dragon dictation along with smaller phrase technology. Any transcriptional errors that result from  this process are unintentional.

## 2015-07-10 NOTE — Patient Instructions (Signed)
Rocephin injection Water/Cranberry juice  F/U pending results

## 2015-07-11 ENCOUNTER — Ambulatory Visit: Payer: BC Managed Care – PPO | Admitting: Family Medicine

## 2015-07-12 LAB — URINE CULTURE

## 2015-07-31 ENCOUNTER — Ambulatory Visit: Payer: BC Managed Care – PPO | Admitting: Family Medicine

## 2015-08-02 ENCOUNTER — Ambulatory Visit: Payer: BC Managed Care – PPO | Admitting: Family Medicine

## 2015-08-09 ENCOUNTER — Ambulatory Visit: Payer: BC Managed Care – PPO | Admitting: Obstetrics and Gynecology

## 2015-08-11 ENCOUNTER — Encounter: Payer: Self-pay | Admitting: Family Medicine

## 2015-08-17 ENCOUNTER — Ambulatory Visit (INDEPENDENT_AMBULATORY_CARE_PROVIDER_SITE_OTHER): Payer: BC Managed Care – PPO | Admitting: Obstetrics and Gynecology

## 2015-08-17 ENCOUNTER — Encounter: Payer: Self-pay | Admitting: Obstetrics and Gynecology

## 2015-08-17 VITALS — BP 100/64 | HR 70 | Resp 16 | Ht 63.0 in | Wt 134.4 lb

## 2015-08-17 DIAGNOSIS — Z113 Encounter for screening for infections with a predominantly sexual mode of transmission: Secondary | ICD-10-CM

## 2015-08-17 DIAGNOSIS — Z8639 Personal history of other endocrine, nutritional and metabolic disease: Secondary | ICD-10-CM | POA: Diagnosis not present

## 2015-08-17 DIAGNOSIS — Z01419 Encounter for gynecological examination (general) (routine) without abnormal findings: Secondary | ICD-10-CM | POA: Diagnosis not present

## 2015-08-17 LAB — CBC
HEMATOCRIT: 37 % (ref 35.0–45.0)
HEMOGLOBIN: 12 g/dL (ref 11.7–15.5)
MCH: 29.5 pg (ref 27.0–33.0)
MCHC: 32.4 g/dL (ref 32.0–36.0)
MCV: 90.9 fL (ref 80.0–100.0)
MPV: 9.7 fL (ref 7.5–12.5)
Platelets: 251 10*3/uL (ref 140–400)
RBC: 4.07 MIL/uL (ref 3.80–5.10)
RDW: 13.4 % (ref 11.0–15.0)
WBC: 4.3 10*3/uL (ref 3.8–10.8)

## 2015-08-17 LAB — COMPREHENSIVE METABOLIC PANEL
ALBUMIN: 4.1 g/dL (ref 3.6–5.1)
ALT: 23 U/L (ref 6–29)
AST: 22 U/L (ref 10–35)
Alkaline Phosphatase: 80 U/L (ref 33–130)
BUN: 9 mg/dL (ref 7–25)
CALCIUM: 8.8 mg/dL (ref 8.6–10.4)
CHLORIDE: 107 mmol/L (ref 98–110)
CO2: 24 mmol/L (ref 20–31)
Creat: 0.69 mg/dL (ref 0.50–1.05)
Glucose, Bld: 97 mg/dL (ref 65–99)
POTASSIUM: 3.8 mmol/L (ref 3.5–5.3)
Sodium: 142 mmol/L (ref 135–146)
TOTAL PROTEIN: 6.3 g/dL (ref 6.1–8.1)
Total Bilirubin: 0.3 mg/dL (ref 0.2–1.2)

## 2015-08-17 LAB — POCT URINALYSIS DIPSTICK
Bilirubin, UA: NEGATIVE
Blood, UA: NEGATIVE
GLUCOSE UA: NEGATIVE
Ketones, UA: NEGATIVE
Leukocytes, UA: NEGATIVE
Nitrite, UA: NEGATIVE
Protein, UA: NEGATIVE
UROBILINOGEN UA: NEGATIVE
pH, UA: 6

## 2015-08-17 LAB — LIPID PANEL
CHOL/HDL RATIO: 3.6 ratio (ref <=5.0)
CHOLESTEROL: 170 mg/dL (ref 125–200)
HDL: 47 mg/dL (ref >=46)
LDL Cholesterol: 101 mg/dL (ref <130)
TRIGLYCERIDES: 112 mg/dL (ref <150)
VLDL: 22 mg/dL (ref <30)

## 2015-08-17 LAB — TSH: TSH: 0.77 mIU/L

## 2015-08-17 LAB — HIV ANTIBODY (ROUTINE TESTING W REFLEX): HIV: NONREACTIVE

## 2015-08-17 NOTE — Progress Notes (Signed)
Patient ID: Sheila [name], female   DOB: 1965/07/08, 50 y.o.   MRN: [id number] 50 y.o. G4P2002 Married Caucasian female here for annual exam.    Several (3) UTIs in the last several months.  Looks like she was on Keflex and that the E Coli may not have been sensitive to this. Not sexually active.  Afraid sex will make the prolapse recur.  Not using Premarin vaginal cream.   Some night sweats.  Hot flashes during the day are better.  Wants her ovaries out when she finishes menopause.  Paternal aunt with ovarian cancer.    Occasional leak of urine with urgency.  Drinks a lot of caffeine and carbonated drinks.  No leakage with cough, run, sneeze.  Having sinus problems and may have nasal septum surgery.  Has eustacian tubes.  Just retired.  Husband also retired.   PCP:  Vic Blackbird, MD   Patient's last menstrual period was 05/06/2009.           Sexually active: No. female The current method of family planning is status post hysterectomy--ovaries remain.    Exercising: Yes.    walking. Smoker:  no  Health Maintenance: Pap:  08-01-14 Neg:Neg HR HPV History of abnormal Pap:  Yes, Hx LEEP 2006 MMG:  01-13-15 3D/Density D/Neg/BiRads1:[facility]. Colonoscopy:  07/2013 normal with Dr. Lenise Herald due 07/2023 BMD:   n/a  Result  n/a TDaP:  08-01-14 Gardasil:   N/A HIV: Hep C: Screening Labs:  Hb today: PCP, Urine today:  Negative.    reports that she has never smoked. She has never used smokeless tobacco. She reports that she does not drink alcohol or use illicit drugs.  Past Medical History  Diagnosis Date  . Allergy     allergic rhinitis  . Anxiety   . Hyperlipidemia   . Acne   . Depression     Past Surgical History  Procedure Laterality Date  . Appendectomy    . Bladder surgery  03/09  . Incontinence surgery    . Bladder tack      2nd bladder surgery  . Cholecystectomy  2015  . Abdominal hysterectomy  07/11/2009     laparosccopically assisted vaginal hysterectomy  with anterior and posterior colporrhaphy, transobturator Monarch sling, cystoscopy/ovaries remain    Current Outpatient Prescriptions  Medication Sig Dispense Refill  . escitalopram (LEXAPRO) 20 MG tablet Take 1 tablet (20 mg total) by mouth daily. 30 tablet 6  . pantoprazole (PROTONIX) 40 MG tablet Take 1 tablet (40 mg total) by mouth daily. 30 tablet 6  . conjugated estrogens (PREMARIN) vaginal cream Place 1 Applicatorful vaginally daily. Use 1/2 g vaginally every night at bed time for 2 weeks, then use 1/2 g vaginally two times per week. (Patient not taking: Reported on 08/17/2015) 60 g 2   No current facility-administered medications for this visit.    Family History  Problem Relation Age of Onset  . Diabetes Father   . Breast cancer Neg Hx   . Colon cancer Neg Hx   . Diabetes Maternal Grandmother   . Uterine cancer Paternal Aunt     ROS:  Pertinent items are noted in HPI.  Otherwise, a comprehensive ROS was negative.  Exam:   BP 100/64 mmHg  Pulse 70  Resp 16  Ht 5\' 3"  (1.6 m)  Wt 134 lb 6.4 oz (60.963 kg)  BMI 23.81 kg/m2  LMP 05/06/2009    General appearance: alert, cooperative and appears stated age Head: Normocephalic, without obvious abnormality,  atraumatic Neck: no adenopathy, supple, symmetrical, trachea midline and thyroid normal to inspection and palpation Lungs: clear to auscultation bilaterally Breasts: normal appearance, no masses or tenderness, Inspection negative, No nipple retraction or dimpling, No nipple discharge or bleeding, No axillary or supraclavicular adenopathy Heart: regular rate and rhythm Abdomen: incisions:  Yes.    laparoscopic , soft, non-tender; no masses, no organomegaly Extremities: extremities normal, atraumatic, no cyanosis or edema Skin: Skin color, texture, turgor normal. No rashes or lesions Lymph nodes: Cervical, supraclavicular, and axillary nodes normal. No abnormal inguinal nodes palpated Neurologic: Grossly normal  Pelvic:  External genitalia:  no lesions              Urethra:  normal appearing urethra with no masses, tenderness or lesions              Bartholins and Skenes: normal                 Vagina: normal appearing vagina with normal color and discharge, no lesions              Cervix: absent              Pap taken: No. Bimanual Exam:  Uterus:  uterus absent              Adnexa: normal adnexa and no mass, fullness, tenderness              Rectal exam: Yes.  .  Confirms.              Anus:  normal sphincter tone, no lesions  Chaperone was present for exam.  Assessment:   Well woman visit with normal exam. Status post LAVH/anterior and posterior colporrhaphy/Transobturator sling/cysto.  Ovaries remain.  Perimenopausal.  Hx recent UTIs.  Overative bladder.  Irritant use. Hx hyperglycemia.  Remote hx of LEEP.  Plan: Yearly mammogram recommended after age 72.  Recommended self breast exam.  Pap and HR HPV as above. Discussed Calcium, Vitamin D, regular exercise program including cardiovascular and weight bearing exercise. Labs performed.  Yes.  .   See orders.  Routine labs, HIV, Hep C.  Prescription medication(s) given.  No.. Patient will call if needs refill on Premarin.  Discussed risks of DVT, PE, MI, stroke, and breast cancer.  Discussed other options for lubricants - water based and oil based products.  Discussed bladder irritants.  Follow up annually and prn.      After visit summary provided.

## 2015-08-17 NOTE — Patient Instructions (Signed)

## 2015-08-18 LAB — HEPATITIS C ANTIBODY: HCV Ab: NEGATIVE

## 2015-08-18 LAB — HEMOGLOBIN A1C
HEMOGLOBIN A1C: 5.9 % — AB (ref <5.7)
MEAN PLASMA GLUCOSE: 123 mg/dL

## 2015-08-18 LAB — VITAMIN D 25 HYDROXY (VIT D DEFICIENCY, FRACTURES): VIT D 25 HYDROXY: 15 ng/mL — AB (ref 30–100)

## 2015-08-20 ENCOUNTER — Other Ambulatory Visit: Payer: Self-pay | Admitting: Obstetrics and Gynecology

## 2015-08-20 ENCOUNTER — Encounter: Payer: Self-pay | Admitting: Obstetrics and Gynecology

## 2015-08-20 DIAGNOSIS — E559 Vitamin D deficiency, unspecified: Secondary | ICD-10-CM

## 2015-08-20 MED ORDER — VITAMIN D (ERGOCALCIFEROL) 1.25 MG (50000 UNIT) PO CAPS: 50000.0000 [IU] | ORAL_CAPSULE | ORAL | Status: DC

## 2015-09-01 ENCOUNTER — Encounter: Payer: Self-pay | Admitting: Family Medicine

## 2015-09-01 ENCOUNTER — Ambulatory Visit (INDEPENDENT_AMBULATORY_CARE_PROVIDER_SITE_OTHER): Payer: BC Managed Care – PPO | Admitting: Family Medicine

## 2015-09-01 VITALS — BP 118/66 | HR 70 | Temp 98.9°F | Resp 14 | Ht 63.0 in | Wt 131.0 lb

## 2015-09-01 DIAGNOSIS — R7303 Prediabetes: Secondary | ICD-10-CM | POA: Diagnosis not present

## 2015-09-01 MED ORDER — ESCITALOPRAM OXALATE 20 MG PO TABS: 20.0000 mg | ORAL_TABLET | Freq: Every day | ORAL | Status: DC

## 2015-09-01 NOTE — Progress Notes (Signed)
Patient ID: Sheila [name], female   DOB: Oct 03, 1965, 50 y.o.   MRN: [id number]   Subjective:    Patient ID: Sheila [name], female    DOB: 04-18-1966, 50 y.o.   MRN: [id number]  Patient presents for F/U and R Ear Issue here for follow-up on labs. She was seen by her GYN for her annual physical she had abnormal A1c of 5.9% of her fasting blood sugar was normal. She also had vitamin D deficiency which she is currently on replacement. She is due to see her in her Synthroid next week but she feels like her right tube might be out she would like me to check this. She is also considering doing the sinus surgery with ENT. Otherwise she has no other concerns. 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  After her last visit she was treated at the urgent care in early April for urinary tract infection with Macrobid    Review Of Systems:  GEN- denies fatigue, fever, weight loss,weakness, recent illness HEENT- denies eye drainage, change in vision, nasal discharge, CVS- denies chest pain, palpitations RESP- denies SOB, cough, wheeze ABD- denies N/V, change in stools, abd pain GU- denies dysuria, hematuria, dribbling, incontinence MSK- denies joint pain, muscle aches, injury Neuro- denies headache, dizziness, syncope, seizure activity       Objective:    BP 118/66 mmHg  Pulse 70  Temp(Src) 98.9 F (37.2 C) (Oral)  Resp 14  Ht 5\' 3"  (1.6 m)  Wt 131 lb (59.421 kg)  BMI 23.21 kg/m2  LMP 05/06/2009 GEN- NAD, alert and oriented x3 HEENT- PERRL, EOMI, non injected sclera, pink conjunctiva, MMM, oropharynx clear Left TM tube in place, canal clear, Right TM clear no effusion, tube in canal        Assessment & Plan:      Problem List Items Addressed This Visit    None    Visit Diagnoses    Borderline diabetes    -  Primary    A1C at 5.9%, she had been eating more sweets recently. No meds needed, barely borderline DM, recheck in 3 months,  decrease sweets. She is exercising more  She'll follow-up with ear nose and throat next week with regards to her tube in her sinus surgery.        Note: This dictation was prepared with Dragon dictation along with smaller phrase technology. Any transcriptional errors that result from this process are unintentional.

## 2015-09-01 NOTE — Patient Instructions (Signed)
F/U 3 months , will have labs

## 2015-09-04 HISTORY — PX: NASAL SINUS SURGERY: SHX719

## 2015-09-15 DIAGNOSIS — J343 Hypertrophy of nasal turbinates: Secondary | ICD-10-CM | POA: Insufficient documentation

## 2015-09-15 DIAGNOSIS — J342 Deviated nasal septum: Secondary | ICD-10-CM | POA: Insufficient documentation

## 2015-09-15 DIAGNOSIS — J324 Chronic pansinusitis: Secondary | ICD-10-CM | POA: Insufficient documentation

## 2015-09-29 ENCOUNTER — Other Ambulatory Visit: Payer: Self-pay | Admitting: Otolaryngology

## 2015-11-09 ENCOUNTER — Other Ambulatory Visit: Payer: Self-pay | Admitting: Obstetrics and Gynecology

## 2015-11-09 NOTE — Telephone Encounter (Signed)
Medication refill request: Vitamin D 50,000 Last AEX:  08-17-15  Next AEX: 08-21-16 Last MMG (if hormonal medication request): 01-13-15 WNL Refill authorized: please advise

## 2015-11-13 ENCOUNTER — Encounter: Payer: Self-pay | Admitting: Obstetrics and Gynecology

## 2015-11-13 ENCOUNTER — Ambulatory Visit (INDEPENDENT_AMBULATORY_CARE_PROVIDER_SITE_OTHER): Payer: BC Managed Care – PPO | Admitting: Obstetrics and Gynecology

## 2015-11-13 ENCOUNTER — Other Ambulatory Visit: Payer: Self-pay

## 2015-11-13 VITALS — Ht 63.0 in | Wt 127.6 lb

## 2015-11-13 DIAGNOSIS — E559 Vitamin D deficiency, unspecified: Secondary | ICD-10-CM

## 2015-11-13 NOTE — Addendum Note (Signed)
Addended by: Lowella Fairy on: 11/13/2015 01:54 PM   Modules accepted: Orders

## 2015-11-13 NOTE — Progress Notes (Signed)
Patient ID: Sheila [name], female   DOB: [birth year], 50 y.o.   MRN: [id number] GYNECOLOGY  VISIT   HPI: 50 y.o.   Married  Caucasian  female   G2P2002 with Patient's last menstrual period was 05/06/2009.   here for 3 month follow up on Vitamin D.    Vit D was 15 three months ago.  Taking OTC vit D 50,000 IU weekly.  Taking PNV also.   States she did not feel good for a long time.  Feeling a better now and is also retired so this helps.   GYNECOLOGIC HISTORY: Patient's last menstrual period was 05/06/2009. Contraception:  Hysterectomy--ovaries remain Menopausal hormone therapy:  none Last mammogram:  01-13-15 3D/Density D/Neg/BiRads1:[facility] Last pap smear:   08-01-14 Neg:Neg HR HPV        OB History    Gravida Para Term Preterm AB TAB SAB Ectopic Multiple Living   2 2 2       2          Patient Active Problem List   Diagnosis Date Noted  . Peripheral edema 01/17/2014  . IBS (irritable bowel syndrome) 06/15/2013  . URI, acute 03/25/2013  . Migraine headache 02/21/2013  . Dizziness 01/27/2013  . Depression 10/13/2012  . CONSTIPATION 07/11/2010  . GERD (gastroesophageal reflux disease) 03/23/2007  . HYPERCHOLESTEROLEMIA, PURE 10/14/2006  . GAD (generalized anxiety disorder) 10/13/2006  . ALLERGIC RHINITIS 10/13/2006  . STRESS INCONTINENCE 10/13/2006    Past Medical History  Diagnosis Date  . Allergy     allergic rhinitis  . Anxiety   . Hyperlipidemia   . Acne   . Depression   . Elevated hemoglobin A1c 2017    5.9  . Low serum vitamin D 2017    15    Past Surgical History  Procedure Laterality Date  . Appendectomy    . Bladder surgery  03/09  . Incontinence surgery    . Bladder tack      2nd bladder surgery  . Cholecystectomy  2015  . Abdominal hysterectomy  07/11/2009     laparosccopically assisted vaginal hysterectomy with anterior and posterior colporrhaphy, transobturator Monarch sling, cystoscopy/ovaries remain  . Nasal sinus surgery  09/2015     Dr.Byers    Current Outpatient Prescriptions  Medication Sig Dispense Refill  . escitalopram (LEXAPRO) 20 MG tablet Take 1 tablet (20 mg total) by mouth daily. 30 tablet 6  . pantoprazole (PROTONIX) 40 MG tablet Take 1 tablet (40 mg total) by mouth daily. 30 tablet 6  . Vitamin D, Ergocalciferol, (DRISDOL) 50000 units CAPS capsule TAKE 1 CAPSULE (50,000 UNITS TOTAL) BY MOUTH EVERY 7 (SEVEN) DAYS.  0   No current facility-administered medications for this visit.     ALLERGIES: Ciprofloxacin  Family History  Problem Relation Age of Onset  . Diabetes Father   . Breast cancer Neg Hx   . Colon cancer Neg Hx   . Diabetes Maternal Grandmother   . Uterine cancer Paternal Aunt     Social History   Social History  . Marital Status: Married    Spouse Name: N/A  . Number of Children: N/A  . Years of Education: N/A   Occupational History  . Not on file.   Social History Main Topics  . Smoking status: Never Smoker   . Smokeless tobacco: Never Used  . Alcohol Use: No  . Drug Use: No  . Sexual Activity:    Partners: Male    Birth Control/ Protection: Surgical  Comment: TVH--ovaries remain   Other Topics Concern  . Not on file   Social History Narrative   Sheila [name]   2 kids   Married 1989    ROS:  Pertinent items are noted in HPI.  PHYSICAL EXAMINATION:    Ht 5\' 3"  (1.6 m)  Wt 127 lb 9.6 oz (57.879 kg)  BMI 22.61 kg/m2  LMP 05/06/2009    General appearance: alert, cooperative and appears stated age  ASSESSMENT  Vit D deficiency.  Depression.  On Lexapro.   PLAN  Discussed vit D deficiency, even its effects on well being.  Will measure vit D today.  May need to continue prescription dosing depending on the level.  Discussed importance of calcium as well.  Reviewed sources in diet and Tums as a good inexpensive form.    An After Visit Summary was printed and given to the patient.  _10_____ minutes face to face time of which over  50% was spent in counseling.

## 2015-11-14 LAB — VITAMIN D 25 HYDROXY (VIT D DEFICIENCY, FRACTURES): Vit D, 25-Hydroxy: 51 ng/mL (ref 30–100)

## 2015-11-16 ENCOUNTER — Other Ambulatory Visit: Payer: Self-pay | Admitting: Obstetrics and Gynecology

## 2015-11-17 NOTE — Telephone Encounter (Signed)
Left message with patient and advised her that she does not need refill rx for Vitamin D due to she has excellent levels. -sco

## 2015-11-17 NOTE — Telephone Encounter (Signed)
Medication refill request: Vitamin D 50000 Units Last AEX:  08/17/15 BS Next AEX: 08/21/16 Last MMG (if hormonal medication request): 01/13/15 [id number]  Refill authorized: 09/01/15 Take 1 pill q 7 days. 0R. Medication was entered as historical. Please advise. Thank you.

## 2015-12-05 ENCOUNTER — Ambulatory Visit (INDEPENDENT_AMBULATORY_CARE_PROVIDER_SITE_OTHER): Payer: BC Managed Care – PPO | Admitting: Family Medicine

## 2015-12-05 ENCOUNTER — Encounter: Payer: Self-pay | Admitting: Family Medicine

## 2015-12-05 VITALS — BP 112/68 | HR 62 | Temp 98.5°F | Resp 12 | Ht 63.0 in | Wt 128.0 lb

## 2015-12-05 DIAGNOSIS — E7800 Pure hypercholesterolemia, unspecified: Secondary | ICD-10-CM

## 2015-12-05 DIAGNOSIS — R7303 Prediabetes: Secondary | ICD-10-CM

## 2015-12-05 LAB — CBC WITH DIFFERENTIAL/PLATELET
BASOS ABS: 0 {cells}/uL (ref 0–200)
BASOS PCT: 0 %
EOS ABS: 141 {cells}/uL (ref 15–500)
Eosinophils Relative: 3 %
HEMATOCRIT: 39.7 % (ref 35.0–45.0)
HEMOGLOBIN: 13 g/dL (ref 12.0–15.0)
Lymphocytes Relative: 32 %
Lymphs Abs: 1504 cells/uL (ref 850–3900)
MCH: 29.9 pg (ref 27.0–33.0)
MCHC: 32.7 g/dL (ref 32.0–36.0)
MCV: 91.3 fL (ref 80.0–100.0)
MONO ABS: 376 {cells}/uL (ref 200–950)
MONOS PCT: 8 %
MPV: 9.7 fL (ref 7.5–12.5)
NEUTROS ABS: 2679 {cells}/uL (ref 1500–7800)
Neutrophils Relative %: 57 %
PLATELETS: 237 10*3/uL (ref 140–400)
RBC: 4.35 MIL/uL (ref 3.80–5.10)
RDW: 13 % (ref 11.0–15.0)
WBC: 4.7 10*3/uL (ref 3.8–10.8)

## 2015-12-05 LAB — HEMOGLOBIN A1C
Hgb A1c MFr Bld: 5.9 % — ABNORMAL HIGH (ref <5.7)
MEAN PLASMA GLUCOSE: 123 mg/dL

## 2015-12-05 NOTE — Patient Instructions (Signed)
We will call with labs  F/U 6 months

## 2015-12-05 NOTE — Progress Notes (Signed)
   Subjective:    Patient ID: Sheila [name], female    DOB: 08/28/1965, 50 y.o.   MRN: [id number]  Patient presents for 3 month F/U (is fasting) Patient here for follow-up she is felt to be borderline diabetic but her visit with her GYN and her A1c was at 5.9%. We will working on dietary changes. She is here for repeat labs No concerns today. She is taking her Lexapro on a daily basis feels later still helping     Review Of Systems:  GEN- denies fatigue, fever, weight loss,weakness, recent illness HEENT- denies eye drainage, change in vision, nasal discharge, CVS- denies chest pain, palpitations RESP- denies SOB, cough, wheeze ABD- denies N/V, change in stools, abd pain GU- denies dysuria, hematuria, dribbling, incontinence MSK- denies joint pain, muscle aches, injury Neuro- denies headache, dizziness, syncope, seizure activity       Objective:    BP 112/68 (BP Location: Left Arm, Patient Position: Sitting, Cuff Size: Normal)   Pulse 62   Temp 98.5 F (36.9 C) (Oral)   Resp 12   Ht 5\' 3"  (1.6 m)   Wt 128 lb (58.1 kg)   LMP 05/06/2009   BMI 22.67 kg/m  GEN- NAD, alert and oriented x3 HEENT- PERRL, EOMI, non injected sclera, pink conjunctiva, MMM, oropharynx clear CVS- RRR, no murmur RESP-CTAB Pulses- Radial, DP- 2+        Assessment & Plan:      Problem List Items Addressed This Visit    HYPERCHOLESTEROLEMIA, PURE - Primary    Recheck fasting labs She has improved diet Expect labs will be improved as well       Relevant Orders   Lipid panel   Borderline diabetes   Relevant Orders   CBC with Differential/Platelet   Comprehensive metabolic panel   Lipid panel   Hemoglobin A1c    Other Visit Diagnoses   None.     Note: This dictation was prepared with Dragon dictation along with smaller phrase technology. Any transcriptional errors that result from this process are unintentional.

## 2015-12-05 NOTE — Assessment & Plan Note (Signed)
Recheck fasting labs She has improved diet Expect labs will be improved as well

## 2015-12-06 LAB — COMPREHENSIVE METABOLIC PANEL
ALBUMIN: 4.1 g/dL (ref 3.6–5.1)
ALK PHOS: 85 U/L (ref 33–130)
ALT: 16 U/L (ref 6–29)
AST: 20 U/L (ref 10–35)
BUN: 11 mg/dL (ref 7–25)
CALCIUM: 9.5 mg/dL (ref 8.6–10.4)
CHLORIDE: 107 mmol/L (ref 98–110)
CO2: 28 mmol/L (ref 20–31)
Creat: 0.8 mg/dL (ref 0.50–1.05)
Glucose, Bld: 94 mg/dL (ref 70–99)
POTASSIUM: 4 mmol/L (ref 3.5–5.3)
Sodium: 142 mmol/L (ref 135–146)
TOTAL PROTEIN: 6.5 g/dL (ref 6.1–8.1)
Total Bilirubin: 0.4 mg/dL (ref 0.2–1.2)

## 2015-12-06 LAB — LIPID PANEL
CHOL/HDL RATIO: 3.8 ratio (ref <=5.0)
CHOLESTEROL: 194 mg/dL (ref 125–200)
HDL: 51 mg/dL (ref >=46)
LDL Cholesterol: 127 mg/dL (ref <130)
TRIGLYCERIDES: 80 mg/dL (ref <150)
VLDL: 16 mg/dL (ref <30)

## 2015-12-22 ENCOUNTER — Ambulatory Visit (INDEPENDENT_AMBULATORY_CARE_PROVIDER_SITE_OTHER): Payer: BC Managed Care – PPO | Admitting: Family Medicine

## 2015-12-22 ENCOUNTER — Encounter: Payer: Self-pay | Admitting: Family Medicine

## 2015-12-22 VITALS — BP 92/50 | HR 72 | Temp 98.8°F | Resp 14 | Ht 63.0 in | Wt 130.0 lb

## 2015-12-22 DIAGNOSIS — G43809 Other migraine, not intractable, without status migrainosus: Secondary | ICD-10-CM

## 2015-12-22 MED ORDER — SUMATRIPTAN SUCCINATE 50 MG PO TABS: 50.0000 mg | ORAL_TABLET | ORAL | 1 refills | Status: DC | PRN

## 2015-12-22 NOTE — Progress Notes (Signed)
    Subjective:    Patient ID: Sheila [name], female    DOB: Dec 13, 1965, 50 y.o.   MRN: [id number]  Patient presents for Headaches (frequent HA- reports that they are increasing in frequency and intensity)  Pt here with headaches, has mild headache sometimes daily they have been present for past few weeks, did not have at last visit which was 8/1. States seem they come once a year. I looked back in chart she has been treated with imitrex temporarily typically at beginning of fall. She has had recent  (may) sinus surgery does not any sinus symptoms or drainage. Headache more bilat temples, had severe one on Wed felt like a migraine, had some nausea, photophobic, she took inbuprofen and tried to relax and it finally let up, hours later, this has been the most severe one. She does admit to a lot of caffiene and she eats out a lot     Review Of Systems:  GEN- denies fatigue, fever, weight loss,weakness, recent illness HEENT- denies eye drainage, change in vision, nasal discharge, CVS- denies chest pain, palpitations RESP- denies SOB, cough, wheeze ABD- denies N/V, change in stools, abd pain GU- denies dysuria, hematuria, dribbling, incontinence MSK- denies joint pain, muscle aches, injury Neuro- +headache, denies dizziness, syncope, seizure activity       Objective:    BP (!) 92/50 (BP Location: Left Arm, Patient Position: Sitting, Cuff Size: Normal)   Pulse 72   Temp 98.8 F (37.1 C) (Oral)   Resp 14   Ht 5\' 3"  (1.6 m)   Wt 130 lb (59 kg)   LMP 05/06/2009   BMI 23.03 kg/m  GEN- NAD, alert and oriented x3 HEENT- PERRL, EOMI, non injected sclera, pink conjunctiva, MMM, oropharynx clear, fundus difficult to see with small pupil but no papilledema seen  Neck- Supple, FROM  CVS- RRR, no murmur RESP-CTAB Neuro-CNII-XII in tact, no deficits  Pulses- Radial 2+        Assessment & Plan:      Problem List Items Addressed This Visit    Migraine headache - Primary    Seems this may  be a seasonal issue does not have year round, in past controlled with as needed imitrex will try this again. If she still has severe headaches 2-3x per week, we discussed starting topamax Also discussed slowly cutting back on her caffiene, increasing water      Relevant Medications   SUMAtriptan (IMITREX) 50 MG tablet    Other Visit Diagnoses   None.     Note: This dictation was prepared with Dragon dictation along with smaller phrase technology. Any transcriptional errors that result from this process are unintentional.

## 2015-12-22 NOTE — Patient Instructions (Signed)
Take imitrex as prescribed Call if not improved  F/U as previous

## 2015-12-24 ENCOUNTER — Encounter: Payer: Self-pay | Admitting: Family Medicine

## 2015-12-24 NOTE — Assessment & Plan Note (Signed)
Seems this may be a seasonal issue does not have year round, in past controlled with as needed imitrex will try this again. If she still has severe headaches 2-3x per week, we discussed starting topamax Also discussed slowly cutting back on her caffiene, increasing water

## 2015-12-25 ENCOUNTER — Other Ambulatory Visit: Payer: Self-pay | Admitting: Obstetrics and Gynecology

## 2015-12-25 DIAGNOSIS — Z1231 Encounter for screening mammogram for malignant neoplasm of breast: Secondary | ICD-10-CM

## 2016-01-16 ENCOUNTER — Ambulatory Visit
Admission: RE | Admit: 2016-01-16 | Discharge: 2016-01-16 | Disposition: A | Discharge status: Home / Self Care | Payer: BC Managed Care – PPO | Source: Ambulatory Visit | Attending: Obstetrics and Gynecology | Admitting: Obstetrics and Gynecology

## 2016-01-16 DIAGNOSIS — Z1231 Encounter for screening mammogram for malignant neoplasm of breast: Secondary | ICD-10-CM

## 2016-01-21 ENCOUNTER — Other Ambulatory Visit: Payer: Self-pay | Admitting: Family Medicine

## 2016-03-12 ENCOUNTER — Ambulatory Visit (INDEPENDENT_AMBULATORY_CARE_PROVIDER_SITE_OTHER): Payer: BC Managed Care – PPO | Admitting: Family Medicine

## 2016-03-12 ENCOUNTER — Encounter: Payer: Self-pay | Admitting: Family Medicine

## 2016-03-12 VITALS — BP 96/58 | HR 71 | Temp 98.0°F | Wt 127.0 lb

## 2016-03-12 DIAGNOSIS — M25511 Pain in right shoulder: Secondary | ICD-10-CM | POA: Diagnosis not present

## 2016-03-12 DIAGNOSIS — M25512 Pain in left shoulder: Secondary | ICD-10-CM | POA: Diagnosis not present

## 2016-03-12 DIAGNOSIS — K580 Irritable bowel syndrome with diarrhea: Secondary | ICD-10-CM | POA: Diagnosis not present

## 2016-03-12 DIAGNOSIS — F411 Generalized anxiety disorder: Secondary | ICD-10-CM

## 2016-03-12 MED ORDER — MELOXICAM 7.5 MG PO TABS: 7.5000 mg | ORAL_TABLET | Freq: Every day | ORAL | 0 refills | Status: DC

## 2016-03-12 MED ORDER — SACCHAROMYCES BOULARDII 250 MG PO CAPS: 250.0000 mg | ORAL_CAPSULE | Freq: Two times a day (BID) | ORAL | 3 refills | Status: DC

## 2016-03-12 NOTE — Progress Notes (Signed)
   Subjective:    Patient ID: Star Age, female    DOB: 1965-10-23, 50 y.o.   MRN: [id number]  Patient presents for right shoulder pain and bump on right finger Patient here for follow-up on medications. She feels like she is doing well with the Lexapro she still has some stressful events in her life but overall feels like this keeps her mood even.  She's had bilateral shoulder pain the right one for about 3-4 weeks but also on the left. Mostly in the upper arm and shoulder. She has been doing a lot of activity lifting heavy objects. The left one started hurting after she lifted some heavy cases of soda a couple weeks ago. She being gets pain if she lays directly on the arm. She is still able to raise them above her head but gets discomfort. She is not taking anything over-the-counter except an occasional ibuprofen.  She has a small nodule right index finger she is not sure how long this has been there it is not painful there's been no swelling of the joint. They state  History of irritable bowel syndrome was told by gastroenterology to pass to take probiotics. She wanted there is 1 prescription she could try. She does use protonic as needed for her acid reflux she gauges what she is eating    Review Of Systems:  GEN- denies fatigue, fever, weight loss,weakness, recent illness HEENT- denies eye drainage, change in vision, nasal discharge, CVS- denies chest pain, palpitations RESP- denies SOB, cough, wheeze ABD- denies N/V, change in stools, abd pain GU- denies dysuria, hematuria, dribbling, incontinence MSK- + joint pain, muscle aches, injury Neuro- denies headache, dizziness, syncope, seizure activity       Objective:    BP (!) 96/58 (BP Location: Right Arm)   Pulse 71   Temp 98 F (36.7 C) (Oral)   Wt 127 lb (57.6 kg)   LMP 05/06/2009   SpO2 98%   BMI 22.50 kg/m  GEN- NAD, alert and oriented x3 HEENT- PERRL, EOMI, non injected sclera, pink conjunctiva, MMM, oropharynx  clear Neck- Supple, good ROM  CVS- RRR, no murmur RESP-CTAB Psych- normal affect and mood  MSK- fair  ROM upper ext limited by pain  , rotator cuff in tact, biceps intact, TTP over right deltoid, neg impingment Pulses- Radial, DP- 2+        Assessment & Plan:      Problem List Items Addressed This Visit    IBS (irritable bowel syndrome)    Trial of FloraSTOR probiotics No gallbladder, so can not use Viberzi      Relevant Medications   saccharomyces boulardii (FLORASTOR) 250 MG capsule   GAD (generalized anxiety disorder)    Stable on lexapro       Other Visit Diagnoses    Pain of both shoulder joints    -  Primary   Bilat shoulder pain, overuse injury, trial of mobic, decrease lifting above [place]oulder, ICE, not improved ortho       Note: This dictation was prepared with Dragon dictation along with smaller phrase technology. Any transcriptional errors that result from this process are unintentional.

## 2016-03-12 NOTE — Patient Instructions (Addendum)
Try the florastar  Take the mobic once a day with food for 2 weeks  Take the protonix while on the anti-inflammatory  Call if not improving

## 2016-03-13 NOTE — Assessment & Plan Note (Signed)
Stable on lexapro.   

## 2016-03-13 NOTE — Assessment & Plan Note (Addendum)
Trial of FloraSTOR probiotics No gallbladder, so can not use Viberzi

## 2016-03-14 ENCOUNTER — Ambulatory Visit (INDEPENDENT_AMBULATORY_CARE_PROVIDER_SITE_OTHER): Payer: BC Managed Care – PPO | Admitting: Physician Assistant

## 2016-03-14 ENCOUNTER — Encounter: Payer: Self-pay | Admitting: Physician Assistant

## 2016-03-14 VITALS — BP 98/62 | HR 68 | Temp 97.8°F | Resp 16 | Ht 63.0 in | Wt 128.0 lb

## 2016-03-14 DIAGNOSIS — N390 Urinary tract infection, site not specified: Secondary | ICD-10-CM | POA: Diagnosis not present

## 2016-03-14 DIAGNOSIS — R300 Dysuria: Secondary | ICD-10-CM | POA: Diagnosis not present

## 2016-03-14 DIAGNOSIS — R319 Hematuria, unspecified: Secondary | ICD-10-CM

## 2016-03-14 LAB — URINALYSIS, ROUTINE W REFLEX MICROSCOPIC
BILIRUBIN URINE: NEGATIVE
GLUCOSE, UA: NEGATIVE
Ketones, ur: NEGATIVE
Nitrite: NEGATIVE
PH: 5.5 (ref 5.0–8.0)
PROTEIN: NEGATIVE
Specific Gravity, Urine: 1.025 (ref 1.001–1.035)

## 2016-03-14 LAB — URINALYSIS, MICROSCOPIC ONLY
CASTS: NONE SEEN [LPF]
Crystals: NONE SEEN [HPF]
YEAST: NONE SEEN [HPF]

## 2016-03-14 MED ORDER — NITROFURANTOIN MONOHYD MACRO 100 MG PO CAPS: 100.0000 mg | ORAL_CAPSULE | Freq: Two times a day (BID) | ORAL | 0 refills | Status: DC

## 2016-03-14 NOTE — Progress Notes (Signed)
Patient ID: Sheila [name] MRN: [id number], DOB: [birth year], 50 y.o. Date of Encounter: 03/14/2016, 12:09 PM    Chief Complaint:  Chief Complaint  Patient presents with  . urine odor  . urine burning     HPI: 50 y.o. year old female presents with above.   Says that several months ago she had UTIs that were not completely resolving. Says that she was seen here some and then ended up having follow-up at an urgent care and they had to give her different antibiotic before the symptoms completely resolved. She does not remember the name of that antibiotic.  She says that because she has had so many UTIs in the past that is soon as she notices this odor that she comes on in for evaluation. Noticed that same odor to her urine yesterday and also some dysuria. Has had no fevers or chills and no new back pain.     Home Meds:   Outpatient Medications Prior to Visit  Medication Sig Dispense Refill  . escitalopram (LEXAPRO) 20 MG tablet Take 1 tablet (20 mg total) by mouth daily. 30 tablet 6  . meloxicam (MOBIC) 7.5 MG tablet Take 1 tablet (7.5 mg total) by mouth daily. 30 tablet 0  . pantoprazole (PROTONIX) 40 MG tablet TAKE 1 TABLET (40 MG TOTAL) BY MOUTH DAILY. 30 tablet 1  . SUMAtriptan (IMITREX) 50 MG tablet Take 1 tablet (50 mg total) by mouth every 2 (two) hours as needed for migraine. May repeat in 2 hours if headache persists or recurs. 10 tablet 1  . saccharomyces boulardii (FLORASTOR) 250 MG capsule Take 1 capsule (250 mg total) by mouth 2 (two) times daily. (Patient not taking: Reported on 03/14/2016) 60 capsule 3   No facility-administered medications prior to visit.     Allergies:  Allergies  Allergen Reactions  . Ciprofloxacin Nausea And Vomiting      Review of Systems: See HPI for pertinent ROS. All other ROS negative.    Physical Exam: Blood pressure 98/62, pulse 68, temperature 97.8 F (36.6 C), temperature source Oral, resp. rate 16, height 5\' 3"  (1.6 m), weight  128 lb (58.1 kg), last menstrual period 05/06/2009, SpO2 98 %., Body mass index is 22.67 kg/m. General:  WNWD WF. Appears in no acute distress. Neck: Supple. No thyromegaly. No lymphadenopathy. Lungs: Clear bilaterally to auscultation without wheezes, rales, or rhonchi. Breathing is unlabored. Heart: Regular rhythm. No murmurs, rubs, or gallops. Abdomen: Soft, non-tender, non-distended with normoactive bowel sounds. No hepatomegaly. No rebound/guarding. No obvious abdominal masses. Msk:  Strength and tone normal for age. No costophrenic angle tenderness with percussion bilaterally. Extremities/Skin: Warm and dry.  Neuro: Alert and oriented X 3. Moves all extremities spontaneously. Gait is normal. CNII-XII grossly in tact. Psych:  Responds to questions appropriately with a normal affect.   Results for orders placed or performed in visit on 03/14/16  Urinalysis, Routine w reflex microscopic (not at [facility])  Result Value Ref Range   Color, Urine YELLOW YELLOW   APPearance CLOUDY (A) CLEAR   Specific Gravity, Urine 1.025 1.001 - 1.035   pH 5.5 5.0 - 8.0   Glucose, UA NEGATIVE NEGATIVE   Bilirubin Urine NEGATIVE NEGATIVE   Ketones, ur NEGATIVE NEGATIVE   Hgb urine dipstick 2+ (A) NEGATIVE   Protein, ur NEGATIVE NEGATIVE   Nitrite NEGATIVE NEGATIVE   Leukocytes, UA 2+ (A) NEGATIVE  Urine Microscopic  Result Value Ref Range   WBC, UA 20-40 (A) <=5 WBC/HPF   RBC /  HPF 10-20 (A) <=2 RBC/HPF   Squamous Epithelial / LPF 0-5 <=5 HPF   Bacteria, UA MANY (A) NONE SEEN HPF   Crystals NONE SEEN NONE SEEN HPF   Casts NONE SEEN NONE SEEN LPF   Yeast NONE SEEN NONE SEEN HPF     ASSESSMENT AND PLAN:  50 y.o. year old female with  1. Urinary tract infection with hematuria, site unspecified She is to start antibiotic immediately take as directed and complete all of it. I will send culture and follow up with her once we get those results. NOTE---Did review that at prior visit here she was treated  with Keflex which is on antibiotics that did not work well for her. However I do not know what antibiotic was prescribed by the urgent care that ultimately did get rid of her UTI completely. Also discussed with patient that Cipro is on her "allergy list" she has she says that she doesn't think she's allergic to this thinks that she just took this without food and that it caused some nausea. - nitrofurantoin, macrocrystal-monohydrate, (MACROBID) 100 MG capsule; Take 1 capsule (100 mg total) by mouth 2 (two) times daily.  Dispense: 10 capsule; Refill: 0 - Urine culture  2. Burning with urination - Urinalysis, Routine w reflex microscopic (not at [facility])   Signed, [facility] [place], [place], [facility] 03/14/2016 12:09 PM

## 2016-03-17 LAB — URINE CULTURE

## 2016-04-06 ENCOUNTER — Other Ambulatory Visit: Payer: Self-pay | Admitting: Family Medicine

## 2016-05-06 DIAGNOSIS — N210 Calculus in bladder: Secondary | ICD-10-CM

## 2016-05-06 HISTORY — DX: Calculus in bladder: N21.0

## 2016-06-19 ENCOUNTER — Other Ambulatory Visit: Payer: Self-pay | Admitting: Family Medicine

## 2016-07-09 ENCOUNTER — Ambulatory Visit (INDEPENDENT_AMBULATORY_CARE_PROVIDER_SITE_OTHER): Payer: BC Managed Care – PPO | Admitting: Family Medicine

## 2016-07-09 ENCOUNTER — Encounter: Payer: Self-pay | Admitting: Family Medicine

## 2016-07-09 VITALS — BP 100/60 | HR 63 | Temp 97.6°F | Resp 12 | Ht 63.0 in | Wt 127.0 lb

## 2016-07-09 DIAGNOSIS — N390 Urinary tract infection, site not specified: Secondary | ICD-10-CM

## 2016-07-09 DIAGNOSIS — L02811 Cutaneous abscess of head [any part, except face]: Secondary | ICD-10-CM | POA: Diagnosis not present

## 2016-07-09 DIAGNOSIS — M25562 Pain in left knee: Secondary | ICD-10-CM

## 2016-07-09 DIAGNOSIS — M25511 Pain in right shoulder: Secondary | ICD-10-CM

## 2016-07-09 DIAGNOSIS — G8929 Other chronic pain: Secondary | ICD-10-CM

## 2016-07-09 LAB — URINALYSIS, ROUTINE W REFLEX MICROSCOPIC
BILIRUBIN URINE: NEGATIVE
GLUCOSE, UA: NEGATIVE
Hgb urine dipstick: NEGATIVE
KETONES UR: NEGATIVE
Nitrite: NEGATIVE
PH: 6 (ref 5.0–8.0)
Protein, ur: NEGATIVE
SPECIFIC GRAVITY, URINE: 1.02 (ref 1.001–1.035)

## 2016-07-09 LAB — URINALYSIS, MICROSCOPIC ONLY
CASTS: NONE SEEN [LPF]
CRYSTALS: NONE SEEN [HPF]
YEAST: NONE SEEN [HPF]

## 2016-07-09 MED ORDER — MELOXICAM 7.5 MG PO TABS: 7.5000 mg | ORAL_TABLET | Freq: Every day | ORAL | 2 refills | Status: DC

## 2016-07-09 MED ORDER — ESCITALOPRAM OXALATE 20 MG PO TABS: 20.0000 mg | ORAL_TABLET | Freq: Every day | ORAL | 6 refills | Status: DC

## 2016-07-09 MED ORDER — CEPHALEXIN 500 MG PO CAPS: 500.0000 mg | ORAL_CAPSULE | Freq: Two times a day (BID) | ORAL | 0 refills | Status: DC

## 2016-07-09 NOTE — Patient Instructions (Addendum)
Referral to Dr. [facility]  Take antibiotics Use warm compresses Take antibiotics as prescribed Try Replens lubricating  F/U as needed

## 2016-07-09 NOTE — Progress Notes (Signed)
   Subjective:    Patient ID: Sheila [name], female    DOB: 1965/08/05, 51 y.o.   MRN: [id number]  Patient presents for Knot to Back of Head (no injury noted); Joint Pain (states that she thinks she has rotator cuff issues with R shoulder- also voices C/O pain to L knee); and Dysuria (burning with urination)   Dysuria- has burning on and off for past month or so, She has been taking cranberry tablets the past couple weeks. Denies any change with her bowels. Denies any fever abdominal pain.  She noticed a knot in the back of her head yesterday states it looks red in the mirror. Denies any injury to the back of her head.  He's had difficulties with her shoulders the right one is worse when now she has loss of range of motion. She takes anti-inflammatory for a couple weeks but then she lost the bottle. She would like to proceed with seen orthopedics she's also had some left knee discomfort but no swelling no particular injury   Review Of Systems:  GEN- denies fatigue, fever, weight loss,weakness, recent illness HEENT- denies eye drainage, change in vision, nasal discharge, CVS- denies chest pain, palpitations RESP- denies SOB, cough, wheeze ABD- denies N/V, change in stools, abd pain GU- denies dysuria, hematuria, dribbling, incontinence MSK-+oint pain, muscle aches, injury Neuro- denies headache, dizziness, syncope, seizure activity       Objective:    BP 100/60   Pulse 63   Temp 97.6 F (36.4 C) (Oral)   Resp 12   Ht 5\' 3"  (1.6 m)   Wt 127 lb (57.6 kg)   LMP 05/06/2009   SpO2 96%   BMI 22.50 kg/m  GEN- NAD, alert and oriented x3 HEENT- PERRL, EOMI, non injected sclera, pink conjunctiva, MMM, oropharynx clear Neck- Supple, no thyromegaly CVS- RRR, no murmur RESP-CTAB ABD-NABS,soft,NT,ND, NO CVA tenderness MSK- Normal inspection of bilat shoulders Decreased ROM right shoulder, unable to reach bra strap with back reach, equivical empty can, neg, biceps in tact Bilat knee-  normal inspection, +crepitus, no effusion, mild TTP lateral aspect left knee  Skin- Left occipital scalp, small abscess, with erythema, mild flucutance, mild TTP  EXT- No edema Pulses- Radial  2+        Assessment & Plan:      Problem List Items Addressed This Visit    None    Visit Diagnoses    Urinary tract infection without hematuria, site unspecified    -  Primary   Keflex, culture sent   Relevant Medications   cephALEXin (KEFLEX) 500 MG capsule   Other Relevant Orders   Urinalysis, Routine w reflex microscopic (Completed)   Urine culture   Acute pain of left knee       may have some minimal DJD refer to ortho, Mobic   Chronic right shoulder pain       refer to orthopedics, restart mobic   Abscess, scalp       concern for abscess verysmall, unable to I and D, start keflex, warm compresses      Note: This dictation was prepared with Dragon dictation along with smaller phrase technology. Any transcriptional errors that result from this process are unintentional.

## 2016-07-12 ENCOUNTER — Other Ambulatory Visit: Payer: Self-pay | Admitting: *Deleted

## 2016-07-12 LAB — URINE CULTURE

## 2016-07-12 MED ORDER — NITROFURANTOIN MONOHYD MACRO 100 MG PO CAPS: 100.0000 mg | ORAL_CAPSULE | Freq: Two times a day (BID) | ORAL | 0 refills | Status: DC

## 2016-07-18 DIAGNOSIS — G8929 Other chronic pain: Secondary | ICD-10-CM | POA: Insufficient documentation

## 2016-07-29 ENCOUNTER — Encounter: Payer: Self-pay | Admitting: Obstetrics and Gynecology

## 2016-07-29 ENCOUNTER — Ambulatory Visit (INDEPENDENT_AMBULATORY_CARE_PROVIDER_SITE_OTHER): Payer: BC Managed Care – PPO | Admitting: Obstetrics and Gynecology

## 2016-07-29 VITALS — BP 110/68 | HR 64 | Ht 63.0 in | Wt 129.6 lb

## 2016-07-29 DIAGNOSIS — R319 Hematuria, unspecified: Secondary | ICD-10-CM

## 2016-07-29 DIAGNOSIS — N952 Postmenopausal atrophic vaginitis: Secondary | ICD-10-CM

## 2016-07-29 DIAGNOSIS — R3915 Urgency of urination: Secondary | ICD-10-CM | POA: Diagnosis not present

## 2016-07-29 DIAGNOSIS — Z87440 Personal history of urinary (tract) infections: Secondary | ICD-10-CM

## 2016-07-29 LAB — POCT URINALYSIS DIPSTICK
Bilirubin, UA: NEGATIVE
Glucose, UA: NEGATIVE
NITRITE UA: NEGATIVE
PROTEIN UA: NEGATIVE
UROBILINOGEN UA: NEGATIVE (ref ?–2.0)
pH, UA: 5 (ref 5.0–8.0)

## 2016-07-29 MED ORDER — ESTROGENS, CONJUGATED 0.625 MG/GM VA CREA: TOPICAL_CREAM | VAGINAL | 0 refills | Status: DC

## 2016-07-29 NOTE — Progress Notes (Signed)
GYNECOLOGY  VISIT   HPI: 51 y.o.   Married  Caucasian  female   G2P2002 with Patient's last menstrual period was 05/06/2009.   here for evaluation for painful intercourse. Patient states she has had frequent UTIs and she is concerned bladder is prolapsing again.   Had 1 UTI that took 3 - 4 rounds of abx. Finally worked with [employer].  Had another UTI tx with Keflex and then Rifton which worked.  Thinks she had at least 4 infections in the last year.   - UTI 07/12/16 - Enterococcus.  - UTI 03/14/16 - E Coli  - UTI 06/29/16 - E Coli States she has had other infections treated through [facility].  These are not post coital.  Hydrating well.   Leaking urine but not with cough.  Having urgency and unable to get to bathroom on time.  Voids well but does some positioning changes to help with this.   Drinking 1 soda per day.  No coffee or tea.   Sex is painful so she is not having intercourse.  No intercourse in the last year.  Feels she has prolapse again.   Hot flashes are resolving.   Hgb A1C is 5.9 on 12/05/15.  Will be working part time.  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   Urine IHK:VQQVZ WBCs, Trace RBCs - some dysuria today.   GYNECOLOGIC HISTORY: Patient's last menstrual period was 05/06/2009. Contraception:  Hysterectomy--ovaries remain Menopausal hormone therapy:  none Last mammogram: 01-16-16 Density C/Neg/BiRads1:TBC  Last pap smear: 08-01-14 Neg:Neg HR HPV;09-27-10 Neg(Hx of LEEP 2006)        OB History    Gravida Para Term Preterm AB Living   2 2 2     2    SAB TAB Ectopic Multiple Live Births                     Patient Active Problem List   Diagnosis Date Noted  . Borderline diabetes 12/05/2015  . Peripheral edema 01/17/2014  . IBS (irritable bowel syndrome) 06/15/2013  . URI, acute 03/25/2013  . Migraine headache 02/21/2013  . Dizziness 01/27/2013  . Depression 10/13/2012  . CONSTIPATION 07/11/2010  . GERD (gastroesophageal reflux disease)  03/23/2007  . HYPERCHOLESTEROLEMIA, PURE 10/14/2006  . GAD (generalized anxiety disorder) 10/13/2006  . ALLERGIC RHINITIS 10/13/2006  . STRESS INCONTINENCE 10/13/2006    Past Medical History:  Diagnosis Date  . Acne   . Allergy    allergic rhinitis  . Anxiety   . Depression   . Elevated hemoglobin A1c 2017   5.9  . Hyperlipidemia   . Low serum vitamin D 2017   15    Past Surgical History:  Procedure Laterality Date  . ABDOMINAL HYSTERECTOMY  07/11/2009    laparosccopically assisted vaginal hysterectomy with anterior and posterior colporrhaphy, transobturator Monarch sling, cystoscopy/ovaries remain  . APPENDECTOMY    . BLADDER SURGERY  03/09  . bladder tack     2nd bladder surgery  . CHOLECYSTECTOMY  2015  . INCONTINENCE SURGERY    . NASAL SINUS SURGERY  09/2015   Dr.Byers    Current Outpatient Prescriptions  Medication Sig Dispense Refill  . escitalopram (LEXAPRO) 20 MG tablet Take 1 tablet (20 mg total) by mouth daily. 30 tablet 6  . meloxicam (MOBIC) 7.5 MG tablet Take 1 tablet (7.5 mg total) by mouth daily. 30 tablet 2  . pantoprazole (PROTONIX) 40 MG tablet TAKE 1 TABLET (40 MG TOTAL) BY MOUTH DAILY. 30 tablet 1  No current facility-administered medications for this visit.      ALLERGIES: Patient has no known allergies.  Family History  Problem Relation Age of Onset  . Diabetes Father   . Diabetes Maternal Grandmother   . Uterine cancer Paternal Aunt   . Breast cancer Neg Hx   . Colon cancer Neg Hx     Social History   Social History  . Marital status: Married    Spouse name: N/A  . Number of children: N/A  . Years of education: N/A   Occupational History  . Not on file.   Social History Main Topics  . Smoking status: Never Smoker  . Smokeless tobacco: Never Used  . Alcohol use No  . Drug use: No  . Sexual activity: Not Currently    Partners: Male    Birth control/ protection: Surgical     Comment: TVH--ovaries remain   Other Topics  Concern  . Not on file   Social History Narrative   [place]   2 kids   Married 1989    ROS:  Pertinent items are noted in HPI.  PHYSICAL EXAMINATION:    BP 110/68 (BP Location: Right Arm, Patient Position: Sitting, Cuff Size: Normal)   Pulse 64   Ht 5\' 3"  (1.6 m)   Wt 129 lb 9.6 oz (58.8 kg)   LMP 05/06/2009   BMI 22.96 kg/m     General appearance: alert, cooperative and appears stated age   Pelvic: External genitalia:  no lesions              Urethra:  normal appearing urethra with no masses, tenderness or lesions              Bartholins and Skenes: normal                 Vagina: normal appearing vagina with normal color and discharge, atrophy noted. With really strong valsalva, brings her bladder down to first degree.               Cervix:  Absent.                 Bimanual Exam:  Uterus:   Absent.              Adnexa: no mass, fullness, tenderness              Rectal exam: Yes.  .  Confirms.              Anus:  normal sphincter tone, no lesions  Chaperone was present for exam.  ASSESSMENT  Status post LAVH/anterior and posterior colporrhaphy/Transobturator sling/cysto.  Ovaries remain.  Minimal cystocele.  Vaginal atrophy.  Urinary frequency.  Hx recurrent UTIs.   PLAN  Will send urine for micro and culture.  Discussed vaginal atrophy and recurrent UTIs. Will start Premarin vaginal cream 1/2 gm pv at hs for 2 weeks and then reduce to twice weekly.  Discussed potential increased risk of breast cancer.  Will reassess urinary frequency at annual exam visit in about 2 - 3 weeks.  If continues to have recurrent UTIs, will send to urology.  I am not recommending any prolapse care at this time.  I think her symptoms may be a little more pronounced due to the atrophy. However, we need to follow this.    An After Visit Summary was printed and given to the patient.  ___25___ minutes face to face time of which over 50% was spent  in counseling.

## 2016-07-30 LAB — URINE CULTURE

## 2016-07-30 LAB — URINALYSIS, MICROSCOPIC ONLY
BACTERIA UA: NONE SEEN [HPF]
Casts: NONE SEEN [LPF]
Crystals: NONE SEEN [HPF]
RBC / HPF: NONE SEEN RBC/HPF (ref <=2)
Yeast: NONE SEEN [HPF]

## 2016-08-19 ENCOUNTER — Other Ambulatory Visit: Payer: Self-pay | Admitting: Family Medicine

## 2016-08-19 NOTE — Progress Notes (Signed)
51 y.o. G69P2002 Married Caucasian female here for annual exam.    UC from 07/29/16 negative urine culture.  Started Premarin cream and urgency has stopped. States it is expensive. No dysuria.   Has not tried intercourse yet.  Having diarrhea.  Thinks she has IBS.  PCP:  Vic Blackbird, MD  Patient's last menstrual period was 05/06/2009.           Sexually active: No. female The current method of family planning is status post hysterectomy--ovaries remain.    Exercising: Yes.    Walking  Smoker:  no  Health Maintenance: Pap: 08-01-14 Neg:Neg HR HPV; 09-27-10 Neg History of abnormal Pap:  Yes,Hx LEEP 2006  MMG: 01-16-16 Density C/Neg/BiRads1:TBC Colonoscopy: 07/2013 normal with Dr. Lenise Herald due 07/2023. BMD:   n/a  Result  n/a TDaP: 08-01-14 Gardasil:   n/a HIV: Neg Hep C: Neg Screening Labs:   Urine today: not done   reports that she has never smoked. She has never used smokeless tobacco. She reports that she does not drink alcohol or use drugs.  Past Medical History:  Diagnosis Date  . Acne   . Allergy    allergic rhinitis  . Anxiety   . Depression   . Elevated hemoglobin A1c 2017   5.9  . Hyperlipidemia   . Low serum vitamin D 2017   15    Past Surgical History:  Procedure Laterality Date  . ABDOMINAL HYSTERECTOMY  07/11/2009    laparosccopically assisted vaginal hysterectomy with anterior and posterior colporrhaphy, transobturator Monarch sling, cystoscopy/ovaries remain  . APPENDECTOMY    . BLADDER SURGERY  03/09  . bladder tack     2nd bladder surgery  . CHOLECYSTECTOMY  2015  . INCONTINENCE SURGERY    . NASAL SINUS SURGERY  09/2015   Dr.Byers    Current Outpatient Prescriptions  Medication Sig Dispense Refill  . conjugated estrogens (PREMARIN) vaginal cream Use 1/2 g vaginally every night at bed time for the first 2 weeks, then use 1/2 g vaginally twice weekly. 30 g 0  . escitalopram (LEXAPRO) 20 MG tablet Take 1 tablet (20 mg total) by mouth daily. 30  tablet 6  . meloxicam (MOBIC) 7.5 MG tablet Take 1 tablet (7.5 mg total) by mouth daily. 30 tablet 2  . pantoprazole (PROTONIX) 40 MG tablet TAKE 1 TABLET (40 MG TOTAL) BY MOUTH DAILY. 30 tablet 1   No current facility-administered medications for this visit.     Family History  Problem Relation Age of Onset  . Diabetes Father   . Diabetes Maternal Grandmother   . Uterine cancer Paternal Aunt   . Breast cancer Neg Hx   . Colon cancer Neg Hx     ROS:  Pertinent items are noted in HPI.  Otherwise, a comprehensive ROS was negative.  Exam:   BP (!) 106/58 (BP Location: Right Arm, Patient Position: Sitting, Cuff Size: Normal)   Pulse 64   Resp 16   Ht 5\' 3"  (1.6 m)   Wt 130 lb 12.8 oz (59.3 kg)   LMP 05/06/2009   BMI 23.17 kg/m     General appearance: alert, cooperative and appears stated age Head: Normocephalic, without obvious abnormality, atraumatic Neck: no adenopathy, supple, symmetrical, trachea midline and thyroid normal to inspection and palpation Lungs: clear to auscultation bilaterally Breasts: normal appearance, no masses or tenderness, No nipple retraction or dimpling, No nipple discharge or bleeding, No axillary or supraclavicular adenopathy Heart: regular rate and rhythm Abdomen: soft, non-tender; no masses, no organomegaly  Extremities: extremities normal, atraumatic, no cyanosis or edema Skin: Skin color, texture, turgor normal. No rashes or lesions Lymph nodes: Cervical, supraclavicular, and axillary nodes normal. No abnormal inguinal nodes palpated Neurologic: Grossly normal  Pelvic: External genitalia:  no lesions              Urethra:  normal appearing urethra with no masses, tenderness or lesions              Bartholins and Skenes: normal                 Vagina: normal appearing vagina with normal color and discharge, no lesions.  Premarin cream noted.              Cervix: absent.              Pap taken: No. Bimanual Exam:  Uterus:   Absent.                Adnexa: no mass, fullness, tenderness              Rectal exam: Yes.  .  Confirms.              Anus:  normal sphincter tone, no lesions  Chaperone was present for exam.  Assessment:   Well woman visit with normal exam. Minimal cystocele.  Vaginal atrophy.  Urinary frequency.  Hx recurrent UTIs.  Much improved with vaginal Premarin cream. Hx elevated hemoglobin A1C. Hx hyperlipidemia.  Plan: Mammogram screening discussed. Recommended self breast awareness. Pap and HR HPV as above. Guidelines for Calcium, Vitamin D, regular exercise program including cardiovascular and weight bearing exercise. Will do compounded estradiol cream when she runs out of this Premarin cream.  Potential increased risk of breast cancer discussed.  Return for fasting labs. Follow up annually and prn.   After visit summary provided.

## 2016-08-21 ENCOUNTER — Ambulatory Visit (INDEPENDENT_AMBULATORY_CARE_PROVIDER_SITE_OTHER): Payer: BC Managed Care – PPO | Admitting: Obstetrics and Gynecology

## 2016-08-21 ENCOUNTER — Encounter: Payer: Self-pay | Admitting: Obstetrics and Gynecology

## 2016-08-21 VITALS — BP 106/58 | HR 64 | Resp 16 | Ht 63.0 in | Wt 130.8 lb

## 2016-08-21 DIAGNOSIS — Z01419 Encounter for gynecological examination (general) (routine) without abnormal findings: Secondary | ICD-10-CM | POA: Diagnosis not present

## 2016-08-21 NOTE — Patient Instructions (Signed)

## 2016-08-23 ENCOUNTER — Telehealth: Payer: Self-pay | Admitting: *Deleted

## 2016-08-23 ENCOUNTER — Telehealth: Payer: Self-pay | Admitting: Family Medicine

## 2016-08-23 MED ORDER — DICLOFENAC SODIUM 75 MG PO TBEC: 75.0000 mg | DELAYED_RELEASE_TABLET | Freq: Two times a day (BID) | ORAL | 1 refills | Status: DC

## 2016-08-23 MED ORDER — TRAMADOL HCL 50 MG PO TABS: 50.0000 mg | ORAL_TABLET | Freq: Two times a day (BID) | ORAL | 1 refills | Status: DC | PRN

## 2016-08-23 NOTE — Telephone Encounter (Signed)
Call placed to patient and patient made aware.   Prescriptions sent to pharmacy.  

## 2016-08-23 NOTE — Telephone Encounter (Signed)
[facility]  Patient is calling to ask questions regarding her antiinflammatory, and switching this is possible

## 2016-08-23 NOTE — Telephone Encounter (Signed)
Received request from pharmacy for PA on Tramadol.   PA submitted.   Dx: G89.29- chronic pain  Received PA Determination.   PA approved.   Pharmacy made aware.

## 2016-08-23 NOTE — Telephone Encounter (Signed)
Returned call to patient.   States that she has shoulder impingement and is going through PT at this time.   States that Mobic 7.5mg  is ineffective in handling the pain. Requested to either increase dose or change to different anti-inflammatory.   MD please advise.

## 2016-08-23 NOTE — Telephone Encounter (Signed)
Okay to changed to diclofenac 75mg  BID  #60 R 1 She can also ultram 50mg  BID prn,  #30 R 1 do not take at same time as her anti-depressant

## 2016-08-26 ENCOUNTER — Encounter: Payer: Self-pay | Admitting: Obstetrics and Gynecology

## 2016-08-26 ENCOUNTER — Ambulatory Visit (INDEPENDENT_AMBULATORY_CARE_PROVIDER_SITE_OTHER): Payer: BC Managed Care – PPO | Admitting: Obstetrics and Gynecology

## 2016-08-26 ENCOUNTER — Other Ambulatory Visit (INDEPENDENT_AMBULATORY_CARE_PROVIDER_SITE_OTHER): Payer: BC Managed Care – PPO

## 2016-08-26 VITALS — BP 110/60 | HR 60 | Temp 97.0°F | Resp 16 | Wt 130.0 lb

## 2016-08-26 DIAGNOSIS — R319 Hematuria, unspecified: Secondary | ICD-10-CM | POA: Diagnosis not present

## 2016-08-26 DIAGNOSIS — Z01419 Encounter for gynecological examination (general) (routine) without abnormal findings: Secondary | ICD-10-CM

## 2016-08-26 DIAGNOSIS — Z0000 Encounter for general adult medical examination without abnormal findings: Secondary | ICD-10-CM

## 2016-08-26 DIAGNOSIS — R300 Dysuria: Secondary | ICD-10-CM

## 2016-08-26 LAB — URINALYSIS, MICROSCOPIC ONLY
Casts: NONE SEEN [LPF]
Crystals: NONE SEEN [HPF]
Yeast: NONE SEEN [HPF]

## 2016-08-26 LAB — COMPREHENSIVE METABOLIC PANEL
ALT: 13 U/L (ref 6–29)
AST: 20 U/L (ref 10–35)
Albumin: 4.2 g/dL (ref 3.6–5.1)
Alkaline Phosphatase: 77 U/L (ref 33–130)
BILIRUBIN TOTAL: 0.4 mg/dL (ref 0.2–1.2)
BUN: 17 mg/dL (ref 7–25)
CALCIUM: 9.4 mg/dL (ref 8.6–10.4)
CO2: 26 mmol/L (ref 20–31)
Chloride: 108 mmol/L (ref 98–110)
Creat: 0.71 mg/dL (ref 0.50–1.05)
GLUCOSE: 95 mg/dL (ref 65–99)
Potassium: 4.1 mmol/L (ref 3.5–5.3)
SODIUM: 142 mmol/L (ref 135–146)
Total Protein: 6.5 g/dL (ref 6.1–8.1)

## 2016-08-26 LAB — POCT URINALYSIS DIPSTICK
Bilirubin, UA: NEGATIVE
Glucose, UA: NEGATIVE
KETONES UA: NEGATIVE
Leukocytes, UA: NEGATIVE
Nitrite, UA: NEGATIVE
PROTEIN UA: NEGATIVE
UROBILINOGEN UA: NEGATIVE U/dL — AB
pH, UA: 6 (ref 5.0–8.0)

## 2016-08-26 LAB — TSH: TSH: 1.21 m[IU]/L

## 2016-08-26 LAB — CBC
HEMATOCRIT: 38.8 % (ref 35.0–45.0)
Hemoglobin: 12.8 g/dL (ref 11.7–15.5)
MCH: 31 pg (ref 27.0–33.0)
MCHC: 33 g/dL (ref 32.0–36.0)
MCV: 93.9 fL (ref 80.0–100.0)
MPV: 9.8 fL (ref 7.5–12.5)
PLATELETS: 237 10*3/uL (ref 140–400)
RBC: 4.13 MIL/uL (ref 3.80–5.10)
RDW: 13 % (ref 11.0–15.0)
WBC: 4.5 10*3/uL (ref 3.8–10.8)

## 2016-08-26 LAB — LIPID PANEL
CHOLESTEROL: 213 mg/dL — AB (ref <200)
HDL: 59 mg/dL (ref >50)
LDL Cholesterol: 132 mg/dL — ABNORMAL HIGH (ref <100)
Total CHOL/HDL Ratio: 3.6 Ratio (ref <5.0)
Triglycerides: 112 mg/dL (ref <150)
VLDL: 22 mg/dL (ref <30)

## 2016-08-26 MED ORDER — PHENAZOPYRIDINE HCL 200 MG PO TABS: ORAL_TABLET | ORAL | 0 refills | Status: DC

## 2016-08-26 MED ORDER — NITROFURANTOIN MONOHYD MACRO 100 MG PO CAPS: 100.0000 mg | ORAL_CAPSULE | Freq: Two times a day (BID) | ORAL | 0 refills | Status: DC

## 2016-08-26 NOTE — Patient Instructions (Signed)

## 2016-08-26 NOTE — Progress Notes (Signed)
GYNECOLOGY  VISIT   HPI: 51 y.o.   Married  Caucasian  female   G2P2002 with Patient's last menstrual period was 05/06/2009.   here c/o dysuria. Symptoms started 3 days ago, dysuria is mild. She currently denies urinary urgency or frequency.   She does c/o mild lower abdominal pain intermittently over the last few days. She does have IBS, has a BM every 3 days, goes from normal to loose. No flank pain, no fevers.  The patient has had multiple UTI's in the last 6 months: 07/09/16: Enterococcus, treated with macrobid 03/14/16: E. Coli Per Dr Amelia Jo note she also had an E. Coli UTI on 06/29/16 Other infections treated through urgent care.  She is not currently sexually active, Dr Quincy Simmonds recently started her on premarin vaginal cream. The cream has helped her urinary urgency. She has had 2 bladder slings in the plast. She is not leaking urine.     GYNECOLOGIC HISTORY: Patient's last menstrual period was 05/06/2009. Contraception:hysterectomy  Menopausal hormone therapy: none         OB History    Gravida Para Term Preterm AB Living   2 2 2     2    SAB TAB Ectopic Multiple Live Births                     Patient Active Problem List   Diagnosis Date Noted  . Borderline diabetes 12/05/2015  . Peripheral edema 01/17/2014  . IBS (irritable bowel syndrome) 06/15/2013  . URI, acute 03/25/2013  . Migraine headache 02/21/2013  . Dizziness 01/27/2013  . Depression 10/13/2012  . CONSTIPATION 07/11/2010  . GERD (gastroesophageal reflux disease) 03/23/2007  . HYPERCHOLESTEROLEMIA, PURE 10/14/2006  . GAD (generalized anxiety disorder) 10/13/2006  . ALLERGIC RHINITIS 10/13/2006  . STRESS INCONTINENCE 10/13/2006    Past Medical History:  Diagnosis Date  . Acne   . Allergy    allergic rhinitis  . Anxiety   . Depression   . Elevated hemoglobin A1c 2017   5.9  . Hyperlipidemia   . Low serum vitamin D 2017   15    Past Surgical History:  Procedure Laterality Date  . ABDOMINAL  HYSTERECTOMY  07/11/2009    laparosccopically assisted vaginal hysterectomy with anterior and posterior colporrhaphy, transobturator Monarch sling, cystoscopy/ovaries remain  . APPENDECTOMY    . BLADDER SURGERY  03/09  . bladder tack     2nd bladder surgery  . CHOLECYSTECTOMY  2015  . INCONTINENCE SURGERY    . NASAL SINUS SURGERY  09/2015   Dr.Byers    Current Outpatient Prescriptions  Medication Sig Dispense Refill  . conjugated estrogens (PREMARIN) vaginal cream Use 1/2 g vaginally every night at bed time for the first 2 weeks, then use 1/2 g vaginally twice weekly. 30 g 0  . diclofenac (VOLTAREN) 75 MG EC tablet Take 1 tablet (75 mg total) by mouth 2 (two) times daily. 60 tablet 1  . escitalopram (LEXAPRO) 20 MG tablet Take 1 tablet (20 mg total) by mouth daily. 30 tablet 6  . pantoprazole (PROTONIX) 40 MG tablet TAKE 1 TABLET (40 MG TOTAL) BY MOUTH DAILY. 30 tablet 1  . traMADol (ULTRAM) 50 MG tablet Take 1 tablet (50 mg total) by mouth 2 (two) times daily as needed. 30 tablet 1   No current facility-administered medications for this visit.      ALLERGIES: Patient has no known allergies.  Family History  Problem Relation Age of Onset  . Diabetes Father   .  Diabetes Maternal Grandmother   . Uterine cancer Paternal Aunt   . Breast cancer Neg Hx   . Colon cancer Neg Hx     Social History   Social History  . Marital status: Married    Spouse name: N/A  . Number of children: N/A  . Years of education: N/A   Occupational History  . Not on file.   Social History Main Topics  . Smoking status: Never Smoker  . Smokeless tobacco: Never Used  . Alcohol use No  . Drug use: No  . Sexual activity: Not Currently    Partners: Male    Birth control/ protection: Surgical     Comment: TVH--ovaries remain   Other Topics Concern  . Not on file   Social History Narrative   [place]   2 kids   Married 1989    Review of Systems  Constitutional:  Positive for malaise/fatigue.  HENT: Negative.   Eyes: Negative.   Respiratory: Negative.   Cardiovascular: Negative.   Gastrointestinal: Negative.   Genitourinary: Positive for dysuria.  Musculoskeletal: Negative.   Skin: Negative.   Neurological: Negative.   Endo/Heme/Allergies: Negative.   Psychiatric/Behavioral: Negative.     PHYSICAL EXAMINATION:    BP 110/60 (BP Location: Right Arm, Patient Position: Sitting, Cuff Size: Normal)   Pulse 60   Temp 97 F (36.1 C)   Resp 16   Wt 130 lb (59 kg)   LMP 05/06/2009   BMI 23.03 kg/m     General appearance: alert, cooperative and appears stated age Abdomen: soft, mildly tender in the peri-umbilical area, no SP tenderness, mildly distended, no masses,  no organomegaly CVA: not tender   ASSESSMENT Dysuria, hematuria, suspect recurrent UTI H/O recurrent UTI's Recently started on premarin vaginal cream which helped her urinary urgency    PLAN Send urine for ua, c&s Macrobid Pyridium Discussed referral to urology if her culture is +   An After Visit Summary was printed and given to the patient.  CC: Dr Quincy Simmonds

## 2016-08-27 LAB — VITAMIN D 25 HYDROXY (VIT D DEFICIENCY, FRACTURES): VIT D 25 HYDROXY: 43 ng/mL (ref 30–100)

## 2016-08-27 LAB — URINE CULTURE

## 2016-08-27 LAB — HEMOGLOBIN A1C
Hgb A1c MFr Bld: 5.2 % (ref <5.7)
Mean Plasma Glucose: 103 mg/dL

## 2016-08-28 ENCOUNTER — Encounter: Payer: Self-pay | Admitting: Obstetrics and Gynecology

## 2016-08-28 ENCOUNTER — Telehealth: Payer: Self-pay

## 2016-08-28 DIAGNOSIS — R102 Pelvic and perineal pain: Secondary | ICD-10-CM

## 2016-08-28 DIAGNOSIS — R319 Hematuria, unspecified: Secondary | ICD-10-CM

## 2016-08-28 NOTE — Telephone Encounter (Signed)
-----   Message from Salvadore Dom, MD sent at 08/27/2016  5:56 PM EDT ----- Please let the patient know that her urine culture returned as contaminated, but no signs of infection. Her urinalysis returned with 10-20 RBC/HPF. I would recommend that she f/u with urology. Please inform and put referral in, microscopic hematuria, recurrent UTI's. Please tell her that she should stop her antibiotics. CC: Dr Quincy Simmonds

## 2016-08-28 NOTE — Telephone Encounter (Signed)
Spoke with patient. Advised of results and message as seen below. Patient is agreeable and verbalizes understanding. Patient states that she has been seen by Dr.MacDiarmid before and contacted [facility] and is scheduled for the first available appointment with him on 10/23/2016. Asking if this is okay. Aware she needs to stop taking her antibiotics.   Routing to Dr.Silva for her review as this is her patient that was seen by Dr.Jerston.

## 2016-08-29 NOTE — Telephone Encounter (Signed)
Thank you for the update. You may close the encounter. 

## 2016-08-29 NOTE — Telephone Encounter (Signed)
Visit Follow-Up Question  Message [phone]  From [place], MD Sent 08/28/2016 11:28 PM  Dr. Quincy Simmonds,  Are you concerned that the red blood cells that were found in my urine analysis could indicate bladder or kidney cancer? I'm worried now. When I look back at all the urine results I have had dating back to June 2015 there were occasions when I had an even higher red blood cell count than this time. Is this something I should be very concerned about?   I will tell you I had some sharp pain on Monday in the same area where I recall having a catheter in the past that was new. It made me wonder if I had a small stone or irritant perhaps. It went away mid-day on Tuesday.   Thanks so much for your time,  Sheila [name]   Responsible Party   Pool - [facility] Clinical Pool No one has taken responsibility for this message.  No actions have been taken on this message   Routing to Dr.Silva for review and advise. Patient is scheduled to see Dr.MacDiarmid on 10/23/2016 as this was his first available that will work with the patient's schedule.

## 2016-08-29 NOTE — Telephone Encounter (Signed)
I reviewed her urine microscopic exams, and indeed she has periodic microscopic hematuria.  I would recommend we go ahead and order a pelvic and abdominal CT scan due to hematuria and pelvic pain.  This will look at her urinary system. We can also try to move up her appointment with Dr. Matilde Sprang.

## 2016-08-29 NOTE — Telephone Encounter (Signed)
Spoke with patient. Advised of message as seen below from [place]. Patient verbalizes understanding and would like to proceed with scheduling CT.  CT scan scheduled with [facility] on 09/04/2016 at 3:10 pm. Patient will need to arrive at 2 pm for hematuria protocol which involves drinking fluids before exam. Scheduled at [place] location. Placed in imaging hold.  Spoke with [facility] earliest appointment with Dr.MacDiarmid is June 15th. Unsure if this is when the patient will be on vacation will check before scheduling.  Spoke with patient. Advised of date, time, and location of CT scan. Patient is agreeable. Patient is unable to be seen on June 15th with [facility]. Advised appointment for 10/23/2016 remains. Advised based on imaging results will work with [facility] regarding scheduling. Patient is agreeable.  Routing to Dr.Silva for review before closing.

## 2016-08-29 NOTE — Telephone Encounter (Signed)
Telephone encounter created to review with Dr.Silva. 

## 2016-08-30 ENCOUNTER — Encounter: Payer: Self-pay | Admitting: Obstetrics and Gynecology

## 2016-08-30 ENCOUNTER — Ambulatory Visit (INDEPENDENT_AMBULATORY_CARE_PROVIDER_SITE_OTHER): Payer: BC Managed Care – PPO | Admitting: Obstetrics and Gynecology

## 2016-08-30 VITALS — BP 122/70 | HR 68 | Temp 98.2°F | Resp 16 | Wt 130.0 lb

## 2016-08-30 DIAGNOSIS — N342 Other urethritis: Secondary | ICD-10-CM | POA: Diagnosis not present

## 2016-08-30 DIAGNOSIS — R309 Painful micturition, unspecified: Secondary | ICD-10-CM | POA: Diagnosis not present

## 2016-08-30 DIAGNOSIS — N8110 Cystocele, unspecified: Secondary | ICD-10-CM

## 2016-08-30 LAB — POCT URINALYSIS DIPSTICK
Bilirubin, UA: NEGATIVE
Blood, UA: NEGATIVE
GLUCOSE UA: NEGATIVE
Ketones, UA: NEGATIVE
LEUKOCYTES UA: NEGATIVE
NITRITE UA: NEGATIVE
PROTEIN UA: NEGATIVE
pH, UA: 7 (ref 5.0–8.0)

## 2016-08-30 MED ORDER — PHENAZOPYRIDINE HCL 200 MG PO TABS: ORAL_TABLET | ORAL | 0 refills | Status: DC

## 2016-08-30 MED ORDER — DOXYCYCLINE HYCLATE 100 MG PO CAPS: 100.0000 mg | ORAL_CAPSULE | Freq: Two times a day (BID) | ORAL | 0 refills | Status: DC

## 2016-08-30 NOTE — Progress Notes (Signed)
GYNECOLOGY  VISIT   HPI: 51 y.o.   Married  Caucasian  female   G2P2002 with Patient's last menstrual period was 05/06/2009.   here for Urinary pain; was taking Pyridium as prescribed   Seen on 08/26/16 for dysuria.  Felt like a hot poker in her urethra. Took Pryidium and did get some improvement.  It feels like a pinch and also hurts when she is not voiding.  Voiding well, sometimes better than others.  Her urine culture from 08/26/16 was negative. Taking Lyons still and did not stop this despite this recommendation.  Used Premarin last hs which is helping her urinary urgency.  No vaginal discharge, itching or burning.   FH of renal stones.   Urine dip is normal today.   GYNECOLOGIC HISTORY: Patient's last menstrual period was 05/06/2009. Contraception:  Hysterectomy Menopausal hormone therapy:  none Last mammogram:  01/16/16 BIRADS 1 Negative/density c Last pap smear:   08/01/14 Pap and HR HPV: NEG        OB History    Gravida Para Term Preterm AB Living   2 2 2     2    SAB TAB Ectopic Multiple Live Births                     Patient Active Problem List   Diagnosis Date Noted  . Borderline diabetes 12/05/2015  . Peripheral edema 01/17/2014  . IBS (irritable bowel syndrome) 06/15/2013  . URI, acute 03/25/2013  . Migraine headache 02/21/2013  . Dizziness 01/27/2013  . Depression 10/13/2012  . CONSTIPATION 07/11/2010  . GERD (gastroesophageal reflux disease) 03/23/2007  . HYPERCHOLESTEROLEMIA, PURE 10/14/2006  . GAD (generalized anxiety disorder) 10/13/2006  . ALLERGIC RHINITIS 10/13/2006  . STRESS INCONTINENCE 10/13/2006    Past Medical History:  Diagnosis Date  . Acne   . Allergy    allergic rhinitis  . Anxiety   . Depression   . Elevated hemoglobin A1c 2017   5.9  . Hyperlipidemia   . Low serum vitamin D 2017   15    Past Surgical History:  Procedure Laterality Date  . ABDOMINAL HYSTERECTOMY  07/11/2009    laparosccopically assisted vaginal  hysterectomy with anterior and posterior colporrhaphy, transobturator Monarch sling, cystoscopy/ovaries remain  . APPENDECTOMY    . BLADDER SURGERY  03/09  . bladder tack     2nd bladder surgery  . CHOLECYSTECTOMY  2015  . INCONTINENCE SURGERY    . NASAL SINUS SURGERY  09/2015   Dr.Byers    Current Outpatient Prescriptions  Medication Sig Dispense Refill  . conjugated estrogens (PREMARIN) vaginal cream Use 1/2 g vaginally every night at bed time for the first 2 weeks, then use 1/2 g vaginally twice weekly. 30 g 0  . diclofenac (VOLTAREN) 75 MG EC tablet Take 1 tablet (75 mg total) by mouth 2 (two) times daily. 60 tablet 1  . escitalopram (LEXAPRO) 20 MG tablet Take 1 tablet (20 mg total) by mouth daily. 30 tablet 6  . nitrofurantoin, macrocrystal-monohydrate, (MACROBID) 100 MG capsule Take 1 capsule (100 mg total) by mouth 2 (two) times daily. 14 capsule 0  . pantoprazole (PROTONIX) 40 MG tablet TAKE 1 TABLET (40 MG TOTAL) BY MOUTH DAILY. 30 tablet 1  . phenazopyridine (PYRIDIUM) 200 MG tablet 1 tab po tid x 2 days 6 tablet 0  . traMADol (ULTRAM) 50 MG tablet Take 1 tablet (50 mg total) by mouth 2 (two) times daily as needed. 30 tablet 1   No current  facility-administered medications for this visit.      ALLERGIES: Patient has no known allergies.  Family History  Problem Relation Age of Onset  . Diabetes Father   . Diabetes Maternal Grandmother   . Uterine cancer Paternal Aunt   . Breast cancer Neg Hx   . Colon cancer Neg Hx     Social History   Social History  . Marital status: Married    Spouse name: N/A  . Number of children: N/A  . Years of education: N/A   Occupational History  . Not on file.   Social History Main Topics  . Smoking status: Never Smoker  . Smokeless tobacco: Never Used  . Alcohol use No  . Drug use: No  . Sexual activity: Not Currently    Partners: Male    Birth control/ protection: Surgical     Comment: TVH--ovaries remain   Other Topics  Concern  . Not on file   Social History Narrative   [place]   2 kids   Married 1989    ROS:  Pertinent items are noted in HPI.  PHYSICAL EXAMINATION:    BP 122/70 (BP Location: Right Arm, Patient Position: Sitting, Cuff Size: Normal)   Pulse 68   Resp 16   Wt 130 lb (59 kg)   LMP 05/06/2009   BMI 23.03 kg/m     General appearance: alert, cooperative and appears stated age Head: Normocephalic, without obvious abnormality, atraumatic Neck: no adenopathy, supple, symmetrical, trachea midline and thyroid normal to inspection and palpation Lungs: clear to auscultation bilaterally Breasts: normal appearance, no masses or tenderness, No nipple retraction or dimpling, No nipple discharge or bleeding, No axillary or supraclavicular adenopathy Heart: regular rate and rhythm Abdomen: soft, non-tender, no masses,  no organomegaly Extremities: extremities normal, atraumatic, no cyanosis or edema Skin: Skin color, texture, turgor normal. No rashes or lesions Lymph nodes: Cervical, supraclavicular, and axillary nodes normal. No abnormal inguinal nodes palpated Neurologic: Grossly normal  Pelvic: External genitalia:  no lesions              Urethra:  normal appearing urethra with no masses, tenderness or lesions              Bartholins and Skenes: normal                 Vagina: normal appearing vagina with Premarin cream inside, no lesions.  With Valsalva, her bladder is down to almost a second degree cystocele.              Cervix:  Absent.                 Bimanual Exam:  Uterus:  Absent.              Adnexa: no mass, fullness, tenderness            Chaperone was present for exam.  ASSESSMENT  Status post bladder surgery in 2009 with Dr. Matilde Sprang.  Status post LAVH/anterior and posterior colporrhaphy with transobturator Monarch sling in 2011. Recurrent cystocele.  Dysuria/urethral pain.  I think this is multifactorial.  Patient may have a component of  urethritis. Possible tension on her midurethral sling from recurrent prolapse.  Intermittent hematuria.  Hx UTIs.  Vaginal atrophy.    PLAN  We discussed Azithromycin versus Doxycycline for urethritis.  I opted for the latter.  I could not do an Affirm today due to the Premarin cream.  She will have a CT scan  of the abdomen and pelvis in 5 days due to the hematuria and UTIs. She will return in 7 days for a recheck.  She may need a prophylactic course of Diflucan.  Will reassess this next week.  A pessary to reduce the prolapse may give her some relief of discomfort.  We have scheduled an appointment with Dr. Matilde Sprang, but unfortunately, this is not until June.  We may need to try to move this up.  Patient expresses appreciation for the care I have given her over the years and states that the surgery I did for her in 2011 improved the quality of her life.    An After Visit Summary was printed and given to the patient.  __25____ minutes face to face time of which over 50% was spent in counseling.

## 2016-09-03 ENCOUNTER — Ambulatory Visit (INDEPENDENT_AMBULATORY_CARE_PROVIDER_SITE_OTHER): Payer: BC Managed Care – PPO | Admitting: Obstetrics and Gynecology

## 2016-09-03 ENCOUNTER — Encounter: Payer: Self-pay | Admitting: Obstetrics and Gynecology

## 2016-09-03 ENCOUNTER — Encounter (HOSPITAL_COMMUNITY): Payer: Self-pay | Admitting: *Deleted

## 2016-09-03 ENCOUNTER — Emergency Department (HOSPITAL_COMMUNITY): Payer: BC Managed Care – PPO

## 2016-09-03 ENCOUNTER — Telehealth: Payer: Self-pay | Admitting: Obstetrics and Gynecology

## 2016-09-03 ENCOUNTER — Emergency Department (HOSPITAL_COMMUNITY)
Admission: EM | Admit: 2016-09-03 | Discharge: 2016-09-03 | Disposition: A | Discharge status: Home / Self Care | Payer: BC Managed Care – PPO | Attending: Emergency Medicine | Admitting: Emergency Medicine

## 2016-09-03 VITALS — BP 102/60 | HR 68 | Temp 98.2°F | Resp 16 | Wt 130.0 lb

## 2016-09-03 DIAGNOSIS — N202 Calculus of kidney with calculus of ureter: Secondary | ICD-10-CM | POA: Insufficient documentation

## 2016-09-03 DIAGNOSIS — R310 Gross hematuria: Secondary | ICD-10-CM | POA: Diagnosis not present

## 2016-09-03 DIAGNOSIS — N201 Calculus of ureter: Secondary | ICD-10-CM

## 2016-09-03 DIAGNOSIS — R350 Frequency of micturition: Secondary | ICD-10-CM | POA: Diagnosis present

## 2016-09-03 DIAGNOSIS — R3989 Other symptoms and signs involving the genitourinary system: Secondary | ICD-10-CM

## 2016-09-03 LAB — URINALYSIS, ROUTINE W REFLEX MICROSCOPIC
BILIRUBIN URINE: NEGATIVE
Bacteria, UA: NONE SEEN
GLUCOSE, UA: NEGATIVE mg/dL
KETONES UR: NEGATIVE mg/dL
LEUKOCYTES UA: NEGATIVE
Nitrite: POSITIVE — AB
PH: 5 (ref 5.0–8.0)
Protein, ur: NEGATIVE mg/dL
Specific Gravity, Urine: 1.014 (ref 1.005–1.030)

## 2016-09-03 LAB — BASIC METABOLIC PANEL
Anion gap: 7 (ref 5–15)
BUN: 19 mg/dL (ref 6–20)
CALCIUM: 9.1 mg/dL (ref 8.9–10.3)
CHLORIDE: 109 mmol/L (ref 101–111)
CO2: 26 mmol/L (ref 22–32)
CREATININE: 0.67 mg/dL (ref 0.44–1.00)
GFR calc Af Amer: >60 mL/min (ref >60)
Glucose, Bld: 92 mg/dL (ref 65–99)
Potassium: 3.9 mmol/L (ref 3.5–5.1)
SODIUM: 142 mmol/L (ref 135–145)

## 2016-09-03 LAB — CBC WITH DIFFERENTIAL/PLATELET
BASOS PCT: 0 %
Basophils Absolute: 0 10*3/uL (ref 0.0–0.1)
EOS ABS: 0.1 10*3/uL (ref 0.0–0.7)
EOS PCT: 2 %
HCT: 36.9 % (ref 36.0–46.0)
HEMOGLOBIN: 12.1 g/dL (ref 12.0–15.0)
LYMPHS ABS: 1.8 10*3/uL (ref 0.7–4.0)
Lymphocytes Relative: 25 %
MCH: 30.6 pg (ref 26.0–34.0)
MCHC: 32.8 g/dL (ref 30.0–36.0)
MCV: 93.4 fL (ref 78.0–100.0)
MONO ABS: 0.6 10*3/uL (ref 0.1–1.0)
MONOS PCT: 8 %
NEUTROS PCT: 65 %
Neutro Abs: 4.8 10*3/uL (ref 1.7–7.7)
PLATELETS: 251 10*3/uL (ref 150–400)
RBC: 3.95 MIL/uL (ref 3.87–5.11)
RDW: 12.6 % (ref 11.5–15.5)
WBC: 7.4 10*3/uL (ref 4.0–10.5)

## 2016-09-03 MED ORDER — ONDANSETRON 4 MG PO TBDP: 4.0000 mg | ORAL_TABLET | Freq: Three times a day (TID) | ORAL | 1 refills | Status: DC | PRN

## 2016-09-03 MED ORDER — ONDANSETRON 4 MG PO TBDP
4.0000 mg | ORAL_TABLET | Freq: Once | ORAL | Status: AC
  Administered 2016-09-03: 4 mg via ORAL
  Filled 2016-09-03: qty 1

## 2016-09-03 MED ORDER — HYDROCODONE-ACETAMINOPHEN 5-325 MG PO TABS: 1.0000 | ORAL_TABLET | Freq: Four times a day (QID) | ORAL | 0 refills | Status: DC | PRN

## 2016-09-03 MED ORDER — LIDOCAINE 5 % EX OINT: 1.0000 "application " | TOPICAL_OINTMENT | Freq: Three times a day (TID) | CUTANEOUS | 0 refills | Status: DC

## 2016-09-03 MED ORDER — NAPROXEN 500 MG PO TABS: 500.0000 mg | ORAL_TABLET | Freq: Two times a day (BID) | ORAL | 0 refills | Status: DC

## 2016-09-03 MED ORDER — HYDROCODONE-ACETAMINOPHEN 5-325 MG PO TABS
1.0000 | ORAL_TABLET | Freq: Once | ORAL | Status: AC
  Administered 2016-09-03: 1 via ORAL
  Filled 2016-09-03: qty 1

## 2016-09-03 NOTE — Telephone Encounter (Signed)
Spoke with patient, advised CT at [facility] cancelled for 09/04/16. Will wait for results from current CT prior to rescheduling at [facility]. Patient verbalizes understanding and is agreeable.   Dr. Talbert Nan, routing [place].  Routing to provider for final review. Patient is agreeable to disposition. Will close encounter.   Cc: Dr. Quincy Simmonds

## 2016-09-03 NOTE — Progress Notes (Signed)
GYNECOLOGY  VISIT   HPI: 51 y.o.   Married  Caucasian  female   G2P2002 with Patient's last menstrual period was 05/06/2009.   here for urethra pain; patient complains of pain with and without urination; per patient has used Pyridium for 8 days, has not helped; patient thinks that she may have a kidney stone.   She is hurting so much, teary. She has been in pain for the last 8 days. She was started on doxycycline for possible urethritis, didn't help it. Pyridium helps just a little. Her is mostly constant, stabbing, 8/10 in severity. It makes her cry, + nausea. She does have urinary frequency. No abdominal pain, mild mid-back pain. If she doesn't take the pyridium the pain is awful.  Urine culture from 08/26/16 was negative for infection. Urine dip neative on 08/30/16.  No fevers.  The patient has a h/o recurrent UTI's and microscopic hematuria. She has premarin vaginal cream She has a CT scan tomorrow.   GYNECOLOGIC HISTORY: Patient's last menstrual period was 05/06/2009. Contraception:Hysterectomy Menopausal hormone therapy: none        OB History    Gravida Para Term Preterm AB Living   2 2 2     2    SAB TAB Ectopic Multiple Live Births                     Patient Active Problem List   Diagnosis Date Noted  . Borderline diabetes 12/05/2015  . Peripheral edema 01/17/2014  . IBS (irritable bowel syndrome) 06/15/2013  . URI, acute 03/25/2013  . Migraine headache 02/21/2013  . Dizziness 01/27/2013  . Depression 10/13/2012  . CONSTIPATION 07/11/2010  . GERD (gastroesophageal reflux disease) 03/23/2007  . HYPERCHOLESTEROLEMIA, PURE 10/14/2006  . GAD (generalized anxiety disorder) 10/13/2006  . ALLERGIC RHINITIS 10/13/2006  . STRESS INCONTINENCE 10/13/2006    Past Medical History:  Diagnosis Date  . Acne   . Allergy    allergic rhinitis  . Anxiety   . Depression   . Elevated hemoglobin A1c 2017   5.9  . Hyperlipidemia   . Low serum vitamin D 2017   15    Past  Surgical History:  Procedure Laterality Date  . ABDOMINAL HYSTERECTOMY  07/11/2009    laparosccopically assisted vaginal hysterectomy with anterior and posterior colporrhaphy, transobturator Monarch sling, cystoscopy/ovaries remain  . APPENDECTOMY    . BLADDER SURGERY  03/09  . bladder tack     2nd bladder surgery  . CHOLECYSTECTOMY  2015  . INCONTINENCE SURGERY    . NASAL SINUS SURGERY  09/2015   Dr.Byers    Current Outpatient Prescriptions  Medication Sig Dispense Refill  . conjugated estrogens (PREMARIN) vaginal cream Use 1/2 g vaginally every night at bed time for the first 2 weeks, then use 1/2 g vaginally twice weekly. 30 g 0  . diclofenac (VOLTAREN) 75 MG EC tablet Take 1 tablet (75 mg total) by mouth 2 (two) times daily. 60 tablet 1  . doxycycline (VIBRAMYCIN) 100 MG capsule Take 1 capsule (100 mg total) by mouth 2 (two) times daily. Take for one week. 14 capsule 0  . escitalopram (LEXAPRO) 20 MG tablet Take 1 tablet (20 mg total) by mouth daily. 30 tablet 6  . nitrofurantoin, macrocrystal-monohydrate, (MACROBID) 100 MG capsule Take 1 capsule (100 mg total) by mouth 2 (two) times daily. 14 capsule 0  . pantoprazole (PROTONIX) 40 MG tablet TAKE 1 TABLET (40 MG TOTAL) BY MOUTH DAILY. 30 tablet 1  . phenazopyridine (PYRIDIUM)  200 MG tablet 1 tab po tid x 2 days 6 tablet 0  . traMADol (ULTRAM) 50 MG tablet Take 1 tablet (50 mg total) by mouth 2 (two) times daily as needed. 30 tablet 1   No current facility-administered medications for this visit.      ALLERGIES: Patient has no known allergies.  Family History  Problem Relation Age of Onset  . Diabetes Father   . Diabetes Maternal Grandmother   . Uterine cancer Paternal Aunt   . Breast cancer Neg Hx   . Colon cancer Neg Hx     Social History   Social History  . Marital status: Married    Spouse name: N/A  . Number of children: N/A  . Years of education: N/A   Occupational History  . Not on file.   Social History Main  Topics  . Smoking status: Never Smoker  . Smokeless tobacco: Never Used  . Alcohol use No  . Drug use: No  . Sexual activity: Not Currently    Partners: Male    Birth control/ protection: Surgical     Comment: TVH--ovaries remain   Other Topics Concern  . Not on file   Social History Narrative   [place]   2 kids   Married 1989    Review of Systems  Constitutional: Negative.   HENT: Negative.   Eyes: Negative.   Respiratory: Negative.   Cardiovascular: Negative.   Gastrointestinal: Negative.   Genitourinary:       Pain with urination  Musculoskeletal: Negative.   Skin: Negative.   Neurological: Negative.   Endo/Heme/Allergies: Negative.   Psychiatric/Behavioral: Negative.     PHYSICAL EXAMINATION:    BP 102/60 (BP Location: Right Arm, Patient Position: Sitting, Cuff Size: Normal)   Pulse 68   Temp 98.2 F (36.8 C) (Oral)   Resp 16   Wt 130 lb (59 kg)   LMP 05/06/2009   BMI 23.03 kg/m     General appearance: alert, cooperative and appears stated age Abdomen: soft, non-tender; bowel sounds normal; no masses,  no organomegaly CVA: not tender  Pelvic: External genitalia:  no lesions, she is tender to palpation of the vestibule and urethra with a cotton swab (with lubricant)              Urethra:  normal appearing urethra with no masses, tender externally only, no lesions              Bartholins and Skenes: normal                 Vagina: normal appearing vagina with normal color and discharge, no lesions              Cervix: absent              Bimanual Exam:  Uterus:  uterus absent              Adnexa: no mass, fullness, tenderness               Chaperone was present for exam.  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  15 minutes face to face time of which over 50% was spent in counseling.

## 2016-09-03 NOTE — ED Provider Notes (Signed)
[facility] DEPT Provider Note   CSN: [id number] Arrival date & time: 09/03/16  1410     History   Chief Complaint Chief Complaint  Patient presents with  . Urinary Frequency    HPI Sheila [name] is a 51 y.o. female.  Patient with five-day history of pelvic vaginal discomfort urethral discomfort. Patient seen by her GYN for this. Urine test was done came back positive for blood but urine culture was negative. Patient was treated for UTI but did not improve. Patient seen by them again  Today today. Perplexed on the actual causes of the pain. Patient does not have any history of kidney stones in the past. Patient has had bladder sling procedure 2. Patient denies any fevers. Does not hurt more to urinate. Does have blood in the urine. Does not have any back pain.       Past Medical History:  Diagnosis Date  . Acne   . Allergy    allergic rhinitis  . Anxiety   . Depression   . Elevated hemoglobin A1c 2017   5.9  . Hyperlipidemia   . Low serum vitamin D 2017   15    Patient Active Problem List   Diagnosis Date Noted  . Borderline diabetes 12/05/2015  . Peripheral edema 01/17/2014  . IBS (irritable bowel syndrome) 06/15/2013  . URI, acute 03/25/2013  . Migraine headache 02/21/2013  . Dizziness 01/27/2013  . Depression 10/13/2012  . CONSTIPATION 07/11/2010  . GERD (gastroesophageal reflux disease) 03/23/2007  . HYPERCHOLESTEROLEMIA, PURE 10/14/2006  . GAD (generalized anxiety disorder) 10/13/2006  . ALLERGIC RHINITIS 10/13/2006  . STRESS INCONTINENCE 10/13/2006    Past Surgical History:  Procedure Laterality Date  . ABDOMINAL HYSTERECTOMY  07/11/2009    laparosccopically assisted vaginal hysterectomy with anterior and posterior colporrhaphy, transobturator Monarch sling, cystoscopy/ovaries remain  . APPENDECTOMY    . BLADDER SURGERY  03/09  . bladder tack     2nd bladder surgery  . CHOLECYSTECTOMY  2015  . INCONTINENCE SURGERY    . NASAL SINUS SURGERY  09/2015    Dr.Byers    OB History    Gravida Para Term Preterm AB Living   2 2 2     2    SAB TAB Ectopic Multiple Live Births                   Home Medications    Prior to Admission medications   Medication Sig Start Date End Date Taking? Authorizing Provider  conjugated estrogens (PREMARIN) vaginal cream Use 1/2 g vaginally every night at bed time for the first 2 weeks, then use 1/2 g vaginally twice weekly. 07/29/16  Yes [place], MD  diclofenac (VOLTAREN) 75 MG EC tablet Take 1 tablet (75 mg total) by mouth 2 (two) times daily. 08/23/16  Yes Alycia Rossetti, MD  escitalopram (LEXAPRO) 20 MG tablet Take 1 tablet (20 mg total) by mouth daily. 07/09/16  Yes Alycia Rossetti, MD  lidocaine (XYLOCAINE) 5 % ointment Apply 1 application topically 3 (three) times daily. 09/03/16  Yes Salvadore Dom, MD  pantoprazole (PROTONIX) 40 MG tablet TAKE 1 TABLET (40 MG TOTAL) BY MOUTH DAILY. 08/19/16  Yes Alycia Rossetti, MD  phenazopyridine (PYRIDIUM) 200 MG tablet 1 tab po tid x 2 days 08/30/16  Yes Brook E Yisroel Ramming, MD  traMADol (ULTRAM) 50 MG tablet Take 1 tablet (50 mg total) by mouth 2 (two) times daily as needed. 08/23/16  Yes Modena Nunnery  [place], MD  doxycycline (VIBRAMYCIN) 100 MG capsule Take 1 capsule (100 mg total) by mouth 2 (two) times daily. Take for one week. 08/30/16   Brook Oletta Lamas, MD  HYDROcodone-acetaminophen (NORCO/VICODIN) 5-325 MG tablet Take 1-2 tablets by mouth every 6 (six) hours as needed for moderate pain. 09/03/16   Fredia Sorrow, MD  naproxen (NAPROSYN) 500 MG tablet Take 1 tablet (500 mg total) by mouth 2 (two) times daily. 09/03/16   Fredia Sorrow, MD  ondansetron (ZOFRAN ODT) 4 MG disintegrating tablet Take 1 tablet (4 mg total) by mouth every 8 (eight) hours as needed for nausea or vomiting. 09/03/16   Fredia Sorrow, MD    Family History Family History  Problem Relation Age of Onset  . Diabetes Father   . Diabetes Maternal Grandmother   .  Uterine cancer Paternal Aunt   . Breast cancer Neg Hx   . Colon cancer Neg Hx     Social History Social History  Substance Use Topics  . Smoking status: Never Smoker  . Smokeless tobacco: Never Used  . Alcohol use No     Allergies   Patient has no known allergies.   Review of Systems Review of Systems  Constitutional: Negative for fever.  HENT: Negative for congestion.   Eyes: Negative for redness.  Respiratory: Negative for shortness of breath.   Cardiovascular: Negative for chest pain.  Gastrointestinal: Positive for nausea. Negative for abdominal pain and vomiting.  Genitourinary: Positive for hematuria and vaginal pain. Negative for flank pain.  Musculoskeletal: Negative for back pain.  Skin: Negative for rash.  Neurological: Negative for headaches.  Hematological: Does not bruise/bleed easily.  Psychiatric/Behavioral: Negative for confusion.     Physical Exam Updated Vital Signs BP 103/60 (BP Location: Left Arm)   Pulse 72   Temp 97.7 F (36.5 C) (Oral)   Resp 20   Ht 5\' 3"  (1.6 m)   Wt 57.6 kg   LMP 05/06/2009   SpO2 96%   BMI 22.50 kg/m   Physical Exam  Constitutional: She is oriented to person, place, and time. She appears well-developed and well-nourished. No distress.  HENT:  Head: Normocephalic and atraumatic.  Mouth/Throat: Oropharynx is clear and moist.  Eyes: EOM are normal. Pupils are equal, round, and reactive to light.  Neck: Neck supple.  Cardiovascular: Normal rate, regular rhythm and normal heart sounds.   Pulmonary/Chest: Effort normal and breath sounds normal. No respiratory distress.  Abdominal: Soft. Bowel sounds are normal. There is no tenderness.  Musculoskeletal: Normal range of motion.  Neurological: She is alert and oriented to person, place, and time. No cranial nerve deficit or sensory deficit. She exhibits normal muscle tone. Coordination normal.  Skin: Skin is warm.  Nursing note and vitals reviewed.    ED Treatments /  Results  Labs (all labs ordered are listed, but only abnormal results are displayed) Labs Reviewed  URINALYSIS, ROUTINE W REFLEX MICROSCOPIC - Abnormal; Notable for the following:       Result Value   Color, Urine RED (*)    Hgb urine dipstick MODERATE (*)    Nitrite POSITIVE (*)    Squamous Epithelial / LPF 0-5 (*)    All other components within normal limits  URINE CULTURE  CBC WITH DIFFERENTIAL/PLATELET  BASIC METABOLIC PANEL    EKG  EKG Interpretation None       Radiology Ct Renal Stone Study  Result Date: 09/03/2016 CLINICAL DATA:  Evaluate for kidney stones. Hematuria. Previous cholecystectomy, hysterectomy and  appendectomy. EXAM: CT ABDOMEN AND PELVIS WITHOUT CONTRAST TECHNIQUE: Multidetector CT imaging of the abdomen and pelvis was performed following the standard protocol without IV contrast. COMPARISON:  04/15/2014 FINDINGS: Lower chest: Within normal. Hepatobiliary: Previous cholecystectomy. Liver and biliary tree are normal. Pancreas: Within normal. Spleen: Within normal. Adrenals/Urinary Tract: Adrenal glands are normal. Kidneys are normal in size. There are multiple right renal stones with the largest over the lower pole measuring 4 mm. No left renal stones. No significant hydronephrosis. Ureters are within normal. There is a 3 mm stone within the bladder adjacent the right UVJ likely recently passed from the right collecting system. Stomach/Bowel: Stomach and small bowel are within normal. Previous appendectomy. Colon is within normal. Vascular/Lymphatic: Vascular structures are within normal. There is no adenopathy. Reproductive: Previous hysterectomy. Other: No free fluid or focal inflammatory change. Multiple pelvic phleboliths. Musculoskeletal: Within normal. IMPRESSION: Right-sided nephrolithiasis. 3 mm stone just within the bladder adjacent the right UVJ likely passed from the right collecting system. Electronically Signed   By: Marin Olp M.D.   On: 09/03/2016 16:28      Procedures Procedures (including critical care time)  Medications Ordered in ED Medications  HYDROcodone-acetaminophen (NORCO/VICODIN) 5-325 MG per tablet 1 tablet (1 tablet Oral Given 09/03/16 1634)  ondansetron (ZOFRAN-ODT) disintegrating tablet 4 mg (4 mg Oral Given 09/03/16 1634)     Initial Impression / Assessment and Plan / ED Course  I have reviewed the triage vital signs and the nursing notes.  Pertinent labs & imaging results that were available during my care of the patient were reviewed by me and considered in my medical decision making (see chart for details).     Patient with history of frequent urinary tract infections. Patient's been having urinary discomfort more pain. Be in the urethral area. Doesn't really matter whether she urinated or not and hematuria. Recent culture was negative. This is been followed up by her GYN doctor. Today's workup shows evidence of multiple kidney stones on the right side and there is a 3 mm stone in the distal ureter. This may be the cause of her symptoms for the past 5 days. Patient was significant improvement here with pain medicine. Patient given referral to urology. For follow-up. Patient's also had 2 bladder sling procedures and is possible it may make it difficult for her past stone.  This may have been the stone that caused her pain or maybe-past other stones throughout the 5 days. Since her multiple stones up in the kidney. Patient nontoxic no acute distress no evidence of any significant hydronephrosis. Renal function is normal.  Final Clinical Impressions(s) / ED Diagnoses   Final diagnoses:  Right ureteral stone  Gross hematuria    New Prescriptions New Prescriptions   HYDROCODONE-ACETAMINOPHEN (NORCO/VICODIN) 5-325 MG TABLET    Take 1-2 tablets by mouth every 6 (six) hours as needed for moderate pain.   NAPROXEN (NAPROSYN) 500 MG TABLET    Take 1 tablet (500 mg total) by mouth 2 (two) times daily.   ONDANSETRON (ZOFRAN ODT)  4 MG DISINTEGRATING TABLET    Take 1 tablet (4 mg total) by mouth every 8 (eight) hours as needed for nausea or vomiting.     Fredia Sorrow, MD 09/03/16 [phone]

## 2016-09-03 NOTE — Telephone Encounter (Signed)
Spoke with Alaina at [place], [place] for 09/04/16 cancelled.

## 2016-09-03 NOTE — ED Triage Notes (Signed)
Pt recently seen by Dr. Quincy Simmonds who is the pt GYN to get a workup.  Pt had a urine test done and the test came back with blood in the urine.  Pt was treated for a UTI and was treated.  Subsequently, pt was informed she did not have a UTI but her urethra with inflamed. Pt reports severe pain in that area.  Pt has also been taking pyridium and AZO for the past 9 days with limited relief.  Pt advised to come in by her GYN to have a CT scan and possibly be seen by urologist.

## 2016-09-03 NOTE — Discharge Instructions (Signed)
CT scan shows right ureteral stone 3 mm distal ureter. Call urology for follow-up. Take the medications as directed. Return for any new or worse symptoms.

## 2016-09-03 NOTE — Telephone Encounter (Signed)
Patient was seen earlier today by Dr. Talbert Nan. She then went to [facility] where they did a CT scan. She said, "You all will need to cancel the appointment with the imaging center for tomorrow please."   Patient also states she will need an appointment with [facility] sooner than June.

## 2016-09-03 NOTE — ED Notes (Signed)
Provider at bedside

## 2016-09-04 ENCOUNTER — Inpatient Hospital Stay: Admission: RE | Admit: 2016-09-04 | Payer: BC Managed Care – PPO | Source: Ambulatory Visit

## 2016-09-04 ENCOUNTER — Telehealth: Payer: Self-pay | Admitting: Obstetrics and Gynecology

## 2016-09-04 NOTE — Telephone Encounter (Signed)
Results of CT to Dr.Silva's desk for review. Results also available in EPIC.

## 2016-09-04 NOTE — Telephone Encounter (Addendum)
Spoke with triage nurse at [facility]. Patient scheduled for an appointment on 09/05/2016 at 9:30 am with Timmie Foerster, PA. Per triage nurse patient cannot be worked in today despite Therapist, sports requesting sooner appointment. States this is first available and patient can be evaluated with MD as well if needed at the time of the appointment. Spoke with patient who states she is feeling much better today. Was prescribed Vicodin and Zofran while at the ED yesterday. Took Vicodin 1 tablet last night before bed which has relieved her pain. Is taking Zofran for nausea. Reports she feels well and is  okay to be seen tomorrow for evaluation. Aware if symptoms change or worsen will need to contact our office or [facility]. Patient verbalizes understanding.

## 2016-09-04 NOTE — Telephone Encounter (Signed)
Thank you for the update. You may close the encounter. 

## 2016-09-04 NOTE — Telephone Encounter (Signed)
Patient is returning a call to Jill. °

## 2016-09-04 NOTE — Telephone Encounter (Signed)
Patient needs to see urology! Please call their office and assist in making the appointment for patient to see physician ASAP.

## 2016-09-05 LAB — URINE CULTURE: Culture: NO GROWTH

## 2016-09-06 ENCOUNTER — Ambulatory Visit: Payer: BC Managed Care – PPO | Admitting: Obstetrics and Gynecology

## 2016-09-06 NOTE — Progress Notes (Deleted)
GYNECOLOGY  VISIT   HPI: 51 y.o.   Married  Caucasian  female   G2P2002 with Patient's last menstrual period was 05/06/2009.   here for follow up.  GYNECOLOGIC HISTORY: Patient's last menstrual period was 05/06/2009. Contraception: Hysterectomy Menopausal hormone therapy:  none Last mammogram:  01/16/16 BIRADS 1 Negative/density c Last pap smear: 08-01-14 Neg:Neg HR HPV;09-27-10 Neg        OB History    Gravida Para Term Preterm AB Living   2 2 2     2    SAB TAB Ectopic Multiple Live Births                     Patient Active Problem List   Diagnosis Date Noted  . Borderline diabetes 12/05/2015  . Peripheral edema 01/17/2014  . IBS (irritable bowel syndrome) 06/15/2013  . URI, acute 03/25/2013  . Migraine headache 02/21/2013  . Dizziness 01/27/2013  . Depression 10/13/2012  . CONSTIPATION 07/11/2010  . GERD (gastroesophageal reflux disease) 03/23/2007  . HYPERCHOLESTEROLEMIA, PURE 10/14/2006  . GAD (generalized anxiety disorder) 10/13/2006  . ALLERGIC RHINITIS 10/13/2006  . STRESS INCONTINENCE 10/13/2006    Past Medical History:  Diagnosis Date  . Acne   . Allergy    allergic rhinitis  . Anxiety   . Depression   . Elevated hemoglobin A1c 2017   5.9  . Hyperlipidemia   . Low serum vitamin D 2017   15    Past Surgical History:  Procedure Laterality Date  . ABDOMINAL HYSTERECTOMY  07/11/2009    laparosccopically assisted vaginal hysterectomy with anterior and posterior colporrhaphy, transobturator Monarch sling, cystoscopy/ovaries remain  . APPENDECTOMY    . BLADDER SURGERY  03/09  . bladder tack     2nd bladder surgery  . CHOLECYSTECTOMY  2015  . INCONTINENCE SURGERY    . NASAL SINUS SURGERY  09/2015   Dr.Byers    Current Outpatient Prescriptions  Medication Sig Dispense Refill  . conjugated estrogens (PREMARIN) vaginal cream Use 1/2 g vaginally every night at bed time for the first 2 weeks, then use 1/2 g vaginally twice weekly. 30 g 0  . diclofenac  (VOLTAREN) 75 MG EC tablet Take 1 tablet (75 mg total) by mouth 2 (two) times daily. 60 tablet 1  . doxycycline (VIBRAMYCIN) 100 MG capsule Take 1 capsule (100 mg total) by mouth 2 (two) times daily. Take for one week. 14 capsule 0  . escitalopram (LEXAPRO) 20 MG tablet Take 1 tablet (20 mg total) by mouth daily. 30 tablet 6  . HYDROcodone-acetaminophen (NORCO/VICODIN) 5-325 MG tablet Take 1-2 tablets by mouth every 6 (six) hours as needed for moderate pain. 20 tablet 0  . lidocaine (XYLOCAINE) 5 % ointment Apply 1 application topically 3 (three) times daily. 30 g 0  . naproxen (NAPROSYN) 500 MG tablet Take 1 tablet (500 mg total) by mouth 2 (two) times daily. 14 tablet 0  . ondansetron (ZOFRAN ODT) 4 MG disintegrating tablet Take 1 tablet (4 mg total) by mouth every 8 (eight) hours as needed for nausea or vomiting. 10 tablet 1  . pantoprazole (PROTONIX) 40 MG tablet TAKE 1 TABLET (40 MG TOTAL) BY MOUTH DAILY. 30 tablet 1  . phenazopyridine (PYRIDIUM) 200 MG tablet 1 tab po tid x 2 days 6 tablet 0  . traMADol (ULTRAM) 50 MG tablet Take 1 tablet (50 mg total) by mouth 2 (two) times daily as needed. 30 tablet 1   No current facility-administered medications for this visit.  ALLERGIES: Patient has no known allergies.  Family History  Problem Relation Age of Onset  . Diabetes Father   . Diabetes Maternal Grandmother   . Uterine cancer Paternal Aunt   . Breast cancer Neg Hx   . Colon cancer Neg Hx     Social History   Social History  . Marital status: Married    Spouse name: N/A  . Number of children: N/A  . Years of education: N/A   Occupational History  . Not on file.   Social History Main Topics  . Smoking status: Never Smoker  . Smokeless tobacco: Never Used  . Alcohol use No  . Drug use: No  . Sexual activity: Not Currently    Partners: Male    Birth control/ protection: Surgical     Comment: TVH--ovaries remain   Other Topics Concern  . Not on file   Social  History Narrative   [place]   2 kids   Married 1989    ROS:  Pertinent items are noted in HPI.  PHYSICAL EXAMINATION:    LMP 05/06/2009     General appearance: alert, cooperative and appears stated age Head: Normocephalic, without obvious abnormality, atraumatic Neck: no adenopathy, supple, symmetrical, trachea midline and thyroid normal to inspection and palpation Lungs: clear to auscultation bilaterally Breasts: normal appearance, no masses or tenderness, No nipple retraction or dimpling, No nipple discharge or bleeding, No axillary or supraclavicular adenopathy Heart: regular rate and rhythm Abdomen: soft, non-tender, no masses,  no organomegaly Extremities: extremities normal, atraumatic, no cyanosis or edema Skin: Skin color, texture, turgor normal. No rashes or lesions Lymph nodes: Cervical, supraclavicular, and axillary nodes normal. No abnormal inguinal nodes palpated Neurologic: Grossly normal  Pelvic: External genitalia:  no lesions              Urethra:  normal appearing urethra with no masses, tenderness or lesions              Bartholins and Skenes: normal                 Vagina: normal appearing vagina with normal color and discharge, no lesions              Cervix: no lesions                Bimanual Exam:  Uterus:  normal size, contour, position, consistency, mobility, non-tender              Adnexa: no mass, fullness, tenderness              Rectal exam: {yes no:[id number]}.  Confirms.              Anus:  normal sphincter tone, no lesions  Chaperone was present for exam.  ASSESSMENT     PLAN     An After Visit Summary was printed and given to the patient.  ______ minutes face to face time of which over 50% was spent in counseling.

## 2016-09-09 ENCOUNTER — Encounter: Payer: Self-pay | Admitting: Obstetrics and Gynecology

## 2016-09-09 NOTE — Telephone Encounter (Signed)
No message needed °

## 2016-10-02 ENCOUNTER — Encounter: Payer: Self-pay | Admitting: Obstetrics and Gynecology

## 2016-10-09 ENCOUNTER — Telehealth: Payer: Self-pay | Admitting: Obstetrics and Gynecology

## 2016-10-09 NOTE — Telephone Encounter (Signed)
Patient would like to speak with nurse about getting a prescription for premarin creme sent to a pharmacy here in [place] that would be cheaper than her regular pharmacy.

## 2016-10-09 NOTE — Telephone Encounter (Signed)
Dr. Quincy Simmonds, ok to send Estradiol vaginal cream 0.02% apply 1/2 gram vaginally twice per week to [facility]?

## 2016-10-10 MED ORDER — NONFORMULARY OR COMPOUNDED ITEM: 11 refills | Status: DC

## 2016-10-10 NOTE — Telephone Encounter (Signed)
Spoke with patient, advised RX faxed to [facility], f/u for filling, advised can take up to 24 hours before medication is ready. Patient verbalizes understanding and is agreeable.  Routing to provider for final review. Patient is agreeable to disposition. Will close encounter.

## 2016-10-10 NOTE — Telephone Encounter (Signed)
Printed RX to Dr. Quincy Simmonds for signature.  Signed RX faxed to [facility] at [phone].

## 2016-10-10 NOTE — Telephone Encounter (Signed)
[place] for compounded vaginal estrogen as written.  Dispense 8 gram tube, RF: 11.

## 2016-11-19 ENCOUNTER — Other Ambulatory Visit: Payer: Self-pay | Admitting: Family Medicine

## 2016-12-17 ENCOUNTER — Other Ambulatory Visit: Payer: Self-pay | Admitting: Obstetrics and Gynecology

## 2016-12-17 DIAGNOSIS — Z1231 Encounter for screening mammogram for malignant neoplasm of breast: Secondary | ICD-10-CM

## 2017-01-15 ENCOUNTER — Ambulatory Visit (INDEPENDENT_AMBULATORY_CARE_PROVIDER_SITE_OTHER): Payer: BC Managed Care – PPO | Admitting: *Deleted

## 2017-01-15 DIAGNOSIS — Z23 Encounter for immunization: Secondary | ICD-10-CM | POA: Diagnosis not present

## 2017-01-15 NOTE — Progress Notes (Signed)
Patient seen in office for Influenza Vaccination.   Tolerated IM administration well.   Immunization history updated.  

## 2017-01-20 ENCOUNTER — Ambulatory Visit
Admission: RE | Admit: 2017-01-20 | Discharge: 2017-01-20 | Disposition: A | Discharge status: Home / Self Care | Payer: BC Managed Care – PPO | Source: Ambulatory Visit | Attending: Obstetrics and Gynecology | Admitting: Obstetrics and Gynecology

## 2017-01-20 DIAGNOSIS — Z1231 Encounter for screening mammogram for malignant neoplasm of breast: Secondary | ICD-10-CM

## 2017-05-13 ENCOUNTER — Encounter: Payer: Self-pay | Admitting: Family Medicine

## 2017-05-13 ENCOUNTER — Ambulatory Visit: Payer: BC Managed Care – PPO | Admitting: Family Medicine

## 2017-05-13 VITALS — BP 100/60 | HR 77 | Temp 98.3°F | Resp 16 | Ht 63.0 in | Wt 131.0 lb

## 2017-05-13 DIAGNOSIS — R300 Dysuria: Secondary | ICD-10-CM

## 2017-05-13 DIAGNOSIS — N3000 Acute cystitis without hematuria: Secondary | ICD-10-CM

## 2017-05-13 LAB — URINALYSIS, ROUTINE W REFLEX MICROSCOPIC
Bilirubin Urine: NEGATIVE
Glucose, UA: NEGATIVE
KETONES UR: NEGATIVE
NITRITE: NEGATIVE
PH: 7.5 (ref 5.0–8.0)
Protein, ur: NEGATIVE
SPECIFIC GRAVITY, URINE: 1.015 (ref 1.001–1.03)

## 2017-05-13 LAB — MICROSCOPIC MESSAGE

## 2017-05-13 MED ORDER — CIPROFLOXACIN HCL 500 MG PO TABS: 500.0000 mg | ORAL_TABLET | Freq: Two times a day (BID) | ORAL | 0 refills | Status: DC

## 2017-05-13 MED ORDER — NITROFURANTOIN MONOHYD MACRO 100 MG PO CAPS
100.0000 mg | ORAL_CAPSULE | Freq: Two times a day (BID) | ORAL | 0 refills | Status: DC
Start: 2017-05-13 — End: 2017-07-28

## 2017-05-13 NOTE — Progress Notes (Signed)
Subjective:    Patient ID: Sheila [name], female    DOB: [birth year], 52 y.o.   MRN: [id number]  HPI  Patient has urinary tract infection. This is her chief complaint. When asked for specifics, the patient reports 3-4 days of dysuria towards the end of her urinary stream, frequency,urgency weak urinary stream. She denies any hematuria or back pain or fever. She denies any nausea or vomiting. She does have a history of kidney stones and still has a right-sided kidney stone last seen on CT in May. However she denies any back pain or radiating pain.  Urinalysis today shows +2 leukocyte esterase and trace blood Past Medical History:  Diagnosis Date  . Acne   . Allergy    allergic rhinitis  . Anxiety   . Bladder stone 2018  . Depression   . Elevated hemoglobin A1c 2017   5.9  . Hyperlipidemia   . Low serum vitamin D 2017   15   Past Surgical History:  Procedure Laterality Date  . ABDOMINAL HYSTERECTOMY  07/11/2009    laparosccopically assisted vaginal hysterectomy with anterior and posterior colporrhaphy, transobturator Monarch sling, cystoscopy/ovaries remain  . APPENDECTOMY    . BLADDER SURGERY  03/09  . bladder tack     2nd bladder surgery  . CHOLECYSTECTOMY  2015  . COLLAGEN INJECTION  07/07/2008   Periurethral injection - Dr. Matilde Sprang  . INCONTINENCE SURGERY  08/04/2007   Spark miduethral sling, cystoscopy - Dr. Matilde Sprang  . NASAL SINUS SURGERY  09/2015   Dr.Byers   Current Outpatient Medications on File Prior to Visit  Medication Sig Dispense Refill  . conjugated estrogens (PREMARIN) vaginal cream Use 1/2 g vaginally every night at bed time for the first 2 weeks, then use 1/2 g vaginally twice weekly. 30 g 0  . escitalopram (LEXAPRO) 20 MG tablet TAKE 1 TABLET (20 MG TOTAL) BY MOUTH DAILY. 30 tablet 6  . pantoprazole (PROTONIX) 40 MG tablet TAKE 1 TABLET (40 MG TOTAL) BY MOUTH DAILY. 30 tablet 1  . naproxen (NAPROSYN) 500 MG tablet Take 1 tablet (500 mg total) by mouth 2  (two) times daily. 14 tablet 0   No current facility-administered medications on file prior to visit.    No Known Allergies Social History   Socioeconomic History  . Marital status: Married    Spouse name: Not on file  . Number of children: Not on file  . Years of education: Not on file  . Highest education level: Not on file  Social Needs  . Financial resource strain: Not on file  . Food insecurity - worry: Not on file  . Food insecurity - inability: Not on file  . Transportation needs - medical: Not on file  . Transportation needs - non-medical: Not on file  Occupational History  . Not on file  Tobacco Use  . Smoking status: Never Smoker  . Smokeless tobacco: Never Used  Substance and Sexual Activity  . Alcohol use: No    Alcohol/week: 0.0 oz  . Drug use: No  . Sexual activity: Not Currently    Partners: Male    Birth control/protection: Surgical    Comment: TVH--ovaries remain  Other Topics Concern  . Not on file  Social History Narrative   Sheila [name]   2 kids   Married 1989  '  Review of Systems  All other systems reviewed and are negative.      Objective:   Physical Exam  Constitutional: She appears well-developed  and well-nourished. No distress.  Cardiovascular: Normal rate, regular rhythm and normal heart sounds.  Pulmonary/Chest: Effort normal and breath sounds normal.  Abdominal: Soft. She exhibits no distension. There is no tenderness.  Skin: She is not diaphoretic.  Vitals reviewed.         Assessment & Plan:  Burning with urination - Plan: Urinalysis, Routine w reflex microscopic, Urine Culture  Acute cystitis without hematuria  Patient has had similar symptoms in the past with questionable whether it was a urinary tract infection versus interstitial cystitis versus nephrolithiasis. Therefore I will obtain a urine culture. However her urinalysis suggests urinary tract infection. I will treat the patient with Macrobid  100 mg tablets 1 by mouth twice a day for 7 days. Originally I sent a prescription for Cipro but the patient requested that I change to [employer]

## 2017-05-15 LAB — URINE CULTURE
MICRO NUMBER:: 90028613
SPECIMEN QUALITY:: ADEQUATE

## 2017-05-16 ENCOUNTER — Encounter: Payer: Self-pay | Admitting: Family Medicine

## 2017-07-16 ENCOUNTER — Encounter: Payer: Self-pay | Admitting: Obstetrics and Gynecology

## 2017-07-20 ENCOUNTER — Other Ambulatory Visit: Payer: Self-pay | Admitting: Family Medicine

## 2017-07-23 ENCOUNTER — Encounter: Payer: Self-pay | Admitting: Obstetrics and Gynecology

## 2017-07-24 ENCOUNTER — Telehealth: Payer: Self-pay | Admitting: Obstetrics and Gynecology

## 2017-07-24 NOTE — Telephone Encounter (Signed)
Patient has an appointment with Dr Quincy Simmonds Monday for bladder consult. Patient had urodynamics procedure done with Dr Maryland Pink at [facility] and would like nurse to get those records for her appointment. Dr Zigmund Daniel office number is [phone].

## 2017-07-24 NOTE — Telephone Encounter (Signed)
Left message with medical records department for Dr Maryland Pink requesting return call for records release for patient.

## 2017-07-24 NOTE — Telephone Encounter (Addendum)
Spoke with [facility] in the medical records department who states records form will need to be completed and faxed to [phone]. Must be completed and signed by the patient.  Spoke with the patient who will come to the office today to complete form. Also wants Dr.Silva to know she is scheduled to have a cystoscope with Dr.Matthews on 08/20/2017.

## 2017-07-28 ENCOUNTER — Encounter: Payer: Self-pay | Admitting: Obstetrics and Gynecology

## 2017-07-28 ENCOUNTER — Ambulatory Visit: Payer: BC Managed Care – PPO | Admitting: Obstetrics and Gynecology

## 2017-07-28 ENCOUNTER — Other Ambulatory Visit: Payer: Self-pay

## 2017-07-28 VITALS — BP 110/72 | HR 80 | Resp 16 | Ht 63.0 in | Wt 134.0 lb

## 2017-07-28 DIAGNOSIS — R3915 Urgency of urination: Secondary | ICD-10-CM

## 2017-07-28 DIAGNOSIS — N895 Stricture and atresia of vagina: Secondary | ICD-10-CM

## 2017-07-28 DIAGNOSIS — R102 Pelvic and perineal pain: Secondary | ICD-10-CM

## 2017-07-28 DIAGNOSIS — N8110 Cystocele, unspecified: Secondary | ICD-10-CM | POA: Diagnosis not present

## 2017-07-28 LAB — POCT URINALYSIS DIPSTICK
BILIRUBIN UA: NEGATIVE
Blood, UA: NEGATIVE
Glucose, UA: NEGATIVE
KETONES UA: NEGATIVE
Leukocytes, UA: NEGATIVE
Nitrite, UA: NEGATIVE
PH UA: 5 (ref 5.0–8.0)
Protein, UA: NEGATIVE
UROBILINOGEN UA: 0.2 U/dL

## 2017-07-28 NOTE — Progress Notes (Signed)
[facility] GYNECOLOGY  VISIT   HPI: 52 y.o.   Married  Caucasian  female   G2P2002 with Patient's last menstrual period was 05/06/2009.   here for   Bladder prolapse.  Symptoms started in May 2018.  Developed recurrent UTIs and eventually dx with kidney stones.  Did follow up with [facility] and saw PAs for her care.  Eventually passed stone spontaneously and felt better.  Infections recurred in Fall 2018.   Saw Dr. 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  Recommended an injection of the apex of the vagina.  Had urodynamic testing and had urinary stress incontinence with reduction of prolapse. LPP 36 cm H2O at 150 cc.  Had uroflow testing with void 18 cc and PVR 10 cc.  With pressure flow study has Pdet max of 8 cm H20. Scheduled for a cystoscopy on 08/20/17 with Dr. Zigmund Daniel. Also will have renal imaging done.   She experiences a lot of burning sensation even following tx for UTI.  Not sexually active in 2 years.  She is reporting a lot of pain with speculum exam use.  Feels like she is sitting on a golf ball now.   Urine dip today - negative.  GYNECOLOGIC HISTORY: Patient's last menstrual period was 05/06/2009. Contraception:  Hysterectomy Menopausal hormone therapy:  none Last mammogram:  01/20/17 BIRADS 1 negative/density c Last pap smear:   08/01/14 Pap and HR HPV: NEG        OB History    Gravida  2   Para  2   Term  2   Preterm      AB      Living  2     SAB      TAB      Ectopic      Multiple      Live Births                 Patient Active Problem List   Diagnosis Date Noted  . Borderline diabetes 12/05/2015  . Peripheral edema 01/17/2014  . IBS (irritable bowel syndrome) 06/15/2013  . URI, acute 03/25/2013  . Migraine headache 02/21/2013  . Dizziness 01/27/2013  . Depression 10/13/2012  . CONSTIPATION 07/11/2010  . GERD (gastroesophageal reflux disease)  03/23/2007  . HYPERCHOLESTEROLEMIA, PURE 10/14/2006  . GAD (generalized anxiety disorder) 10/13/2006  . ALLERGIC RHINITIS 10/13/2006  . STRESS INCONTINENCE 10/13/2006    Past Medical History:  Diagnosis Date  . Acne   . Allergy    allergic rhinitis  . Anxiety   . Bladder stone 2018  . Depression   . Elevated hemoglobin A1c 2017   5.9  . Hyperlipidemia   . Low serum vitamin D 2017   15    Past Surgical History:  Procedure Laterality Date  . ABDOMINAL HYSTERECTOMY  07/11/2009    laparosccopically assisted vaginal hysterectomy with anterior and posterior colporrhaphy, transobturator Monarch sling, cystoscopy/ovaries remain  . APPENDECTOMY    . BLADDER SURGERY  03/09  . bladder tack     2nd bladder surgery  . CHOLECYSTECTOMY  2015  . COLLAGEN INJECTION  07/07/2008   Periurethral injection - Dr. Matilde Sprang  . INCONTINENCE SURGERY  08/04/2007   Spark miduethral sling, cystoscopy - Dr. Matilde Sprang  . NASAL SINUS SURGERY  09/2015   Dr.Byers    Current Outpatient Medications  Medication Sig Dispense Refill  . conjugated estrogens (PREMARIN) vaginal cream Use 1/2 g vaginally  every night at bed time for the first 2 weeks, then use 1/2 g vaginally twice weekly. 30 g 0  . escitalopram (LEXAPRO) 20 MG tablet TAKE 1 TABLET BY MOUTH EVERY DAY 30 tablet 0  . pantoprazole (PROTONIX) 40 MG tablet TAKE 1 TABLET (40 MG TOTAL) BY MOUTH DAILY. 30 tablet 1   No current facility-administered medications for this visit.      ALLERGIES: Patient has no known allergies.  Family History  Problem Relation Age of Onset  . Diabetes Father   . Diabetes Maternal Grandmother   . Uterine cancer Paternal Aunt   . Breast cancer Neg Hx   . Colon cancer Neg Hx     Social History   Socioeconomic History  . Marital status: Married    Spouse name: Not on file  . Number of children: Not on file  . Years of education: Not on file  . Highest education level: Not on file  Occupational History  . Not  on file  Social Needs  . Financial resource strain: Not on file  . Food insecurity:    Worry: Not on file    Inability: Not on file  . Transportation needs:    Medical: Not on file    Non-medical: Not on file  Tobacco Use  . Smoking status: Never Smoker  . Smokeless tobacco: Never Used  Substance and Sexual Activity  . Alcohol use: No    Alcohol/week: 0.0 oz  . Drug use: No  . Sexual activity: Not Currently    Partners: Male    Birth control/protection: Surgical    Comment: TVH--ovaries remain  Lifestyle  . Physical activity:    Days per week: Not on file    Minutes per session: Not on file  . Stress: Not on file  Relationships  . Social connections:    Talks on phone: Not on file    Gets together: Not on file    Attends religious service: Not on file    Active member of club or organization: Not on file    Attends meetings of clubs or organizations: Not on file    Relationship status: Not on file  . Intimate partner violence:    Fear of current or ex partner: Not on file    Emotionally abused: Not on file    Physically abused: Not on file    Forced sexual activity: Not on file  Other Topics Concern  . Not on file  Social History Narrative   [place]   2 kids   Married 1989    ROS:  Pertinent items are noted in HPI.  PHYSICAL EXAMINATION:    BP 110/72 (BP Location: Right Arm, Patient Position: Sitting, Cuff Size: Normal)   Pulse 80   Resp 16   Ht 5\' 3"  (1.6 m)   Wt 134 lb (60.8 kg)   LMP 05/06/2009   BMI 23.74 kg/m     General appearance: alert, cooperative and appears stated age  Pelvic: External genitalia:  no lesions              Urethra:  normal appearing urethra with no masses.  Tenderness to palpation of the bilateral pubic rami.               Bartholins and Skenes: normal                 Vagina:  Apical adhesions.  Has two 2 mm bands which I clipped with the patient's  verbal permission. No bleeding.  Almost second degree  cystocele.  No apical or posterior vaginal prolapse.              Cervix:  Absent.                 Bimanual Exam:  Uterus:   Absent.                Adnexa: no mass, fullness, tenderness              Rectal exam: Yes.  .  Confirms.              Anus:  normal sphincter tone, no lesions  Chaperone was present for exam.  ASSESSMENT  Status post LAVH/ant and post repair, Monarch transobturator sling, cystoscopy.  Recurrent stress incontinence on urodynamic testing.  Dyspareunia.  Vaginal adhesions treated with in office lysis.  Recurrent UTIs.  Atrophy.  Hx renal stones.   PLAN  I suspect the patient may be having a hinge effect of the sling on the bladder with her recurrent prolapse.  I would like of her to have a pelvic ultrasound to check her ovaries and for vaginal cuff ovarian adhesions.  She may continue with her vaginal estrogen cream 1/2 gram pv three times weekly or 1 gram pv twice weekly.  I may try a pessary to lift the bladder just to see if correction of the prolapse brings her relief of UTIs and her pelvic pain.  She still likely needs the cystoscopy and renal imaging done.    An After Visit Summary was printed and given to the patient.  ___25___ minutes face to face time of which over 50% was spent in counseling.

## 2017-07-28 NOTE — Progress Notes (Signed)
Scheduled patient while in office for PUS on 07/31/2017 at 11 am with 11:30 am consult with Dr.Silva. Patient is agreeable to date and time.

## 2017-07-29 LAB — VAGINITIS/VAGINOSIS, DNA PROBE
CANDIDA SPECIES: NEGATIVE
Gardnerella vaginalis: POSITIVE — AB
Trichomonas vaginosis: NEGATIVE

## 2017-07-30 ENCOUNTER — Telehealth: Payer: Self-pay

## 2017-07-30 MED ORDER — METRONIDAZOLE 500 MG PO TABS: 500.0000 mg | ORAL_TABLET | Freq: Two times a day (BID) | ORAL | 0 refills | Status: DC

## 2017-07-30 NOTE — Telephone Encounter (Signed)
-----   Message from Nunzio Cobbs, MD sent at 07/29/2017  1:57 PM EDT ----- Please inform of Affirm result showing bacterial vaginosis, a form of vaginitis. She may treat with Flagyl 500 mg po bid for 7 days or Metrogel pv at hs for 5 nights.  Please send Rx to pharmacy of choice. ETOH precautions.

## 2017-07-30 NOTE — Telephone Encounter (Signed)
Spoke with patient. Advised of results as seen below from Dr.Silva. Patient verbalizes understanding. Rx for Flagyl 500 mg po BID x 7 days #14 0RF sent to pharmacy on file. Avoid alcohol during treatment and 24 hours after completing medication. Don't mix with alcohol if mixed can cause severe nausea, vomiting and abdominal cramping.Patient verbalizes understanding. Encounter closed. 

## 2017-07-31 ENCOUNTER — Ambulatory Visit (INDEPENDENT_AMBULATORY_CARE_PROVIDER_SITE_OTHER): Payer: BC Managed Care – PPO

## 2017-07-31 ENCOUNTER — Other Ambulatory Visit: Payer: Self-pay

## 2017-07-31 ENCOUNTER — Encounter: Payer: Self-pay | Admitting: Obstetrics and Gynecology

## 2017-07-31 ENCOUNTER — Ambulatory Visit: Payer: BC Managed Care – PPO | Admitting: Obstetrics and Gynecology

## 2017-07-31 VITALS — BP 110/70 | HR 68 | Resp 14 | Ht 63.0 in | Wt 134.0 lb

## 2017-07-31 DIAGNOSIS — R102 Pelvic and perineal pain: Secondary | ICD-10-CM | POA: Diagnosis not present

## 2017-07-31 DIAGNOSIS — N760 Acute vaginitis: Secondary | ICD-10-CM | POA: Diagnosis not present

## 2017-07-31 DIAGNOSIS — N8110 Cystocele, unspecified: Secondary | ICD-10-CM

## 2017-07-31 DIAGNOSIS — R7309 Other abnormal glucose: Secondary | ICD-10-CM

## 2017-07-31 NOTE — Progress Notes (Signed)
GYNECOLOGY  VISIT   HPI: 52 y.o.   Married  Caucasian  female   G2P2002 with Patient's last menstrual period was 05/06/2009.   here for ultrasound.  Husband present for the visit today.   Seen earlier this week for follow up of UTIs.  I removed scar tissue at the vaginal apex.   She does have recurrent GSI dx through urodynamics with Dr. Zigmund Daniel.   Notes burning sensation even following tx for UTI.  Not SA for 2 years.   Dx with bacterial vaginosis and is now on Flagyl.   GYNECOLOGIC HISTORY: Patient's last menstrual period was 05/06/2009. Contraception:  Hysterectomy Menopausal hormone therapy:  Premarin Last mammogram:  01/20/17 BIRADS 1 negative/density c Last pap smear:   08/01/14 Pap and HR HPV: NEG        OB History    Gravida  2   Para  2   Term  2   Preterm      AB      Living  2     SAB      TAB      Ectopic      Multiple      Live Births                 Patient Active Problem List   Diagnosis Date Noted  . Vaginal adhesions 07/28/2017  . Female bladder prolapse 07/28/2017  . Borderline diabetes 12/05/2015  . Peripheral edema 01/17/2014  . IBS (irritable bowel syndrome) 06/15/2013  . URI, acute 03/25/2013  . Migraine headache 02/21/2013  . Dizziness 01/27/2013  . Depression 10/13/2012  . CONSTIPATION 07/11/2010  . GERD (gastroesophageal reflux disease) 03/23/2007  . HYPERCHOLESTEROLEMIA, PURE 10/14/2006  . GAD (generalized anxiety disorder) 10/13/2006  . ALLERGIC RHINITIS 10/13/2006  . STRESS INCONTINENCE 10/13/2006    Past Medical History:  Diagnosis Date  . Acne   . Allergy    allergic rhinitis  . Anxiety   . Bladder stone 2018  . Depression   . Elevated hemoglobin A1c 2017   5.9  . Hyperlipidemia   . Low serum vitamin D 2017   15    Past Surgical History:  Procedure Laterality Date  . ABDOMINAL HYSTERECTOMY  07/11/2009    laparosccopically assisted vaginal hysterectomy with anterior and posterior colporrhaphy,  transobturator Monarch sling, cystoscopy/ovaries remain  . APPENDECTOMY    . BLADDER SURGERY  03/09  . bladder tack     2nd bladder surgery  . CHOLECYSTECTOMY  2015  . COLLAGEN INJECTION  07/07/2008   Periurethral injection - Dr. Matilde Sprang  . INCONTINENCE SURGERY  08/04/2007   Spark miduethral sling, cystoscopy - Dr. Matilde Sprang  . NASAL SINUS SURGERY  09/2015   Dr.Byers    Current Outpatient Medications  Medication Sig Dispense Refill  . conjugated estrogens (PREMARIN) vaginal cream Use 1/2 g vaginally every night at bed time for the first 2 weeks, then use 1/2 g vaginally twice weekly. 30 g 0  . escitalopram (LEXAPRO) 20 MG tablet TAKE 1 TABLET BY MOUTH EVERY DAY 30 tablet 0  . metroNIDAZOLE (FLAGYL) 500 MG tablet Take 1 tablet (500 mg total) by mouth 2 (two) times daily. 14 tablet 0  . pantoprazole (PROTONIX) 40 MG tablet TAKE 1 TABLET (40 MG TOTAL) BY MOUTH DAILY. 30 tablet 1   No current facility-administered medications for this visit.      ALLERGIES: Patient has no known allergies.  Family History  Problem Relation Age of Onset  . Diabetes Father   .  Diabetes Maternal Grandmother   . Uterine cancer Paternal Aunt   . Breast cancer Neg Hx   . Colon cancer Neg Hx     Social History   Socioeconomic History  . Marital status: Married    Spouse name: Not on file  . Number of children: Not on file  . Years of education: Not on file  . Highest education level: Not on file  Occupational History  . Not on file  Social Needs  . Financial resource strain: Not on file  . Food insecurity:    Worry: Not on file    Inability: Not on file  . Transportation needs:    Medical: Not on file    Non-medical: Not on file  Tobacco Use  . Smoking status: Never Smoker  . Smokeless tobacco: Never Used  Substance and Sexual Activity  . Alcohol use: No    Alcohol/week: 0.0 oz  . Drug use: No  . Sexual activity: Not Currently    Partners: Male    Birth control/protection:  Surgical    Comment: TVH--ovaries remain  Lifestyle  . Physical activity:    Days per week: Not on file    Minutes per session: Not on file  . Stress: Not on file  Relationships  . Social connections:    Talks on phone: Not on file    Gets together: Not on file    Attends religious service: Not on file    Active member of club or organization: Not on file    Attends meetings of clubs or organizations: Not on file    Relationship status: Not on file  . Intimate partner violence:    Fear of current or ex partner: Not on file    Emotionally abused: Not on file    Physically abused: Not on file    Forced sexual activity: Not on file  Other Topics Concern  . Not on file  Social History Narrative   [place]   2 kids   Married 1989    ROS:  Pertinent items are noted in HPI.  PHYSICAL EXAMINATION:    Ht 5\' 3"  (1.6 m)   Wt 134 lb (60.8 kg)   LMP 05/06/2009   BMI 23.74 kg/m     General appearance: alert, cooperative and appears stated age  Pelvic: External genitalia:  no lesions              Urethra:  normal appearing urethra with no masses, tenderness or lesions              Bartholins and Skenes: normal                 Vagina: normal appearing vagina with normal color and discharge, no adhesions noted.              Cervix: absent.                 Bimanual Exam:  Uterus:  Absent.               Adnexa: no mass, fullness, tenderness           Chaperone was present for exam.  Pelvic US Uterus absent.  Vaginal cuff no masses.  Normal ovaries.  No free fluid.   ASSESSMENT  Status post LAVH/ant and post repair, Monarch transobturator sling, cystoscopy.  Recurrent stress incontinence on urodynamic testing.  Dyspareunia.  Vaginal adhesions treated with in office lysis.  Recurrent UTIs.  Atrophy.  Hx renal stones. Bacterial vaginosis.  Elevated hemoglobin A1C.   PLAN  Pessary #2 ring with support fitted and comfortable. States she cannot  feel it.  #3 ring with support is too large and uncomfortable. Will give to patient at next visit after BV tx is completed.  The pessary will help to determine if some of her symptoms are resolved with elevation of the bladder. Check HgbA1C.  We discussed the benefits of vaginal estrogen cream.  She will still have cystoscopy and renal imaging through Dr. Zigmund Daniel.  She will consider local steroid injection of the vagina.  Fu in about 10 days.    An After Visit Summary was printed and given to the patient.  __25____ minutes face to face time of which over 50% was spent in counseling.

## 2017-07-31 NOTE — Progress Notes (Signed)
Encounter reviewed by Dr. Brook Amundson C. Silva.  

## 2017-08-01 LAB — HEMOGLOBIN A1C
Est. average glucose Bld gHb Est-mCnc: 114 mg/dL
Hgb A1c MFr Bld: 5.6 % (ref 4.8–5.6)

## 2017-08-03 ENCOUNTER — Encounter: Payer: Self-pay | Admitting: Obstetrics and Gynecology

## 2017-08-04 ENCOUNTER — Telehealth: Payer: Self-pay | Admitting: Obstetrics and Gynecology

## 2017-08-04 NOTE — Telephone Encounter (Signed)
Spoke with patient. Describes burning at end of voiding on 3/31, denies any other urinary symptoms. No symptoms present today. Has "few more days left of abx for BV", scheduled for f/u with Dr. Quincy Simmonds on 4/8.   Denies N/V, lower back pain, fever/chills.   Advised OV recommended if symptoms return, return call to office to schedule. Keep f/u as scheduled if no changes. Will review with Dr. Quincy Simmonds and return call with any additional recommendations. Patient verbalizes understanding.   Routing to provider for final review. Patient is agreeable to disposition. Will close encounter.

## 2017-08-04 NOTE — Telephone Encounter (Signed)
Patient sent the following correspondence through [facility]. Routing to triage to assist patient with request.  ----- Message from [place], Generic sent at 08/03/2017 8:57 PM EDT -----    Hi Dr. Quincy Simmonds,  I hope you had a wonderful weekend!    I was feeling so good and now I have started to burn again when I urinate. I am assuming I have another bladder infection. It seems I do well for about a week once I complete a round of antibiotics and then the burning returns.     Would you prefer I come to your office for a urine test? If this is another bladder infection, I wanted to see what you thought about my going on a long term-low dose antibiotic for a while?    Take care,  Sheila [name]

## 2017-08-06 NOTE — Progress Notes (Signed)
GYNECOLOGY  VISIT   HPI: 52 y.o.   Married  Caucasian  female   G2P2002 with Patient's last menstrual period was 05/06/2009. here for follow up. Pessary insertion today.  Asking about options for cystoscopy.  She would like to see a physician for cystoscopy.  Was scheduled with Dr. Rodena Piety but she is having scheduling conflict.  Prefers to remain more in my care.  Has had 2 midurethral slings.  One with Dr. Matilde Sprang and one with me.  Spark with him and TOT with me.  States she got relief of urinary incontinence after my sling.  She also had periurethral injections.   Now having recurrent UTIs.   Prefers Premarin cream instead of compounded cream.  GYNECOLOGIC HISTORY: Patient's last menstrual period was 05/06/2009. Contraception:  Hysterectomy Menopausal hormone therapy:  Premarin cream Last mammogram:   01/20/17 BIRADS 1 negative/density c Last pap smear: 08/01/14 Pap and HR HPV: NEG         OB History    Gravida  2   Para  2   Term  2   Preterm      AB      Living  2     SAB      TAB      Ectopic      Multiple      Live Births                 Patient Active Problem List   Diagnosis Date Noted  . Vaginal adhesions 07/28/2017  . Female bladder prolapse 07/28/2017  . Borderline diabetes 12/05/2015  . Peripheral edema 01/17/2014  . IBS (irritable bowel syndrome) 06/15/2013  . URI, acute 03/25/2013  . Migraine headache 02/21/2013  . Dizziness 01/27/2013  . Depression 10/13/2012  . CONSTIPATION 07/11/2010  . GERD (gastroesophageal reflux disease) 03/23/2007  . HYPERCHOLESTEROLEMIA, PURE 10/14/2006  . GAD (generalized anxiety disorder) 10/13/2006  . ALLERGIC RHINITIS 10/13/2006  . STRESS INCONTINENCE 10/13/2006    Past Medical History:  Diagnosis Date  . Acne   . Allergy    allergic rhinitis  . Anxiety   . Bladder stone 2018  . Depression   . Elevated hemoglobin A1c 2017   5.9  . Hyperlipidemia   . Low serum vitamin D 2017   15     Past Surgical History:  Procedure Laterality Date  . ABDOMINAL HYSTERECTOMY  07/11/2009    laparosccopically assisted vaginal hysterectomy with anterior and posterior colporrhaphy, transobturator Monarch sling, cystoscopy/ovaries remain  . APPENDECTOMY    . BLADDER SURGERY  03/09  . bladder tack     2nd bladder surgery  . CHOLECYSTECTOMY  2015  . COLLAGEN INJECTION  07/07/2008   Periurethral injection - Dr. Matilde Sprang  . INCONTINENCE SURGERY  08/04/2007   Spark miduethral sling, cystoscopy - Dr. Matilde Sprang  . NASAL SINUS SURGERY  09/2015   Dr.Byers    Current Outpatient Medications  Medication Sig Dispense Refill  . conjugated estrogens (PREMARIN) vaginal cream Use 1/2 g vaginally every night at bed time for the first 2 weeks, then use 1/2 g vaginally twice weekly. 30 g 0  . escitalopram (LEXAPRO) 20 MG tablet TAKE 1 TABLET BY MOUTH EVERY DAY 30 tablet 0  . pantoprazole (PROTONIX) 40 MG tablet TAKE 1 TABLET (40 MG TOTAL) BY MOUTH DAILY. 30 tablet 1   No current facility-administered medications for this visit.      ALLERGIES: Patient has no known allergies.  Family History  Problem Relation Age of Onset  .  Diabetes Father   . Diabetes Maternal Grandmother   . Uterine cancer Paternal Aunt   . Breast cancer Neg Hx   . Colon cancer Neg Hx     Social History   Socioeconomic History  . Marital status: Married    Spouse name: Not on file  . Number of children: Not on file  . Years of education: Not on file  . Highest education level: Not on file  Occupational History  . Not on file  Social Needs  . Financial resource strain: Not on file  . Food insecurity:    Worry: Not on file    Inability: Not on file  . Transportation needs:    Medical: Not on file    Non-medical: Not on file  Tobacco Use  . Smoking status: Never Smoker  . Smokeless tobacco: Never Used  Substance and Sexual Activity  . Alcohol use: No    Alcohol/week: 0.0 oz  . Drug use: No  . Sexual  activity: Not Currently    Partners: Male    Birth control/protection: Surgical    Comment: TVH--ovaries remain  Lifestyle  . Physical activity:    Days per week: Not on file    Minutes per session: Not on file  . Stress: Not on file  Relationships  . Social connections:    Talks on phone: Not on file    Gets together: Not on file    Attends religious service: Not on file    Active member of club or organization: Not on file    Attends meetings of clubs or organizations: Not on file    Relationship status: Not on file  . Intimate partner violence:    Fear of current or ex partner: Not on file    Emotionally abused: Not on file    Physically abused: Not on file    Forced sexual activity: Not on file  Other Topics Concern  . Not on file  Social History Narrative   [place]   2 kids   Married 1989    ROS:  Pertinent items are noted in HPI.  PHYSICAL EXAMINATION:    BP 100/60 (BP Location: Right Arm, Patient Position: Sitting, Cuff Size: Normal)   Pulse 66   Ht 5\' 3"  (1.6 m)   Wt 133 lb (60.3 kg)   LMP 05/06/2009   BMI 23.56 kg/m     General appearance: alert, cooperative and appears stated age       Chaperone was present for exam.  ASSESSMENT  Status post midurethral sling x 2.  One retropubic and one trans-obturator. Status post periurethral bulking agents.  Cystocele.  Pelvic pain.  Recurrent UTIs.  Status post recent lysis of small vaginal vault adhesions.   PLAN  Pessary Premier #2 fitted and comfortable.  This will be a trial to see if the patient's prolapse if causing pressure on her sling and a hinge effect. Patient able to place and remove. She will void before leaving the office.  Will make a referral for patient to see Dr. Matilde Sprang. Refill of Premarin cream. FU in 1 - 2 weeks.    An After Visit Summary was printed and given to the patient.  __15____ minutes face to face time of which over 50% was spent in counseling.

## 2017-08-11 ENCOUNTER — Ambulatory Visit: Payer: BC Managed Care – PPO | Admitting: Obstetrics and Gynecology

## 2017-08-11 ENCOUNTER — Encounter: Payer: Self-pay | Admitting: Obstetrics and Gynecology

## 2017-08-11 VITALS — BP 100/60 | HR 66 | Ht 63.0 in | Wt 133.0 lb

## 2017-08-11 DIAGNOSIS — R102 Pelvic and perineal pain: Secondary | ICD-10-CM

## 2017-08-11 DIAGNOSIS — N8110 Cystocele, unspecified: Secondary | ICD-10-CM

## 2017-08-11 DIAGNOSIS — Z87440 Personal history of urinary (tract) infections: Secondary | ICD-10-CM | POA: Diagnosis not present

## 2017-08-11 MED ORDER — ESTROGENS, CONJUGATED 0.625 MG/GM VA CREA: TOPICAL_CREAM | VAGINAL | 2 refills | Status: DC

## 2017-08-11 NOTE — Patient Instructions (Signed)
You may take the pessary out once or twice a week at bedtime and leave it out overnight.   Clean the pessary with soap and water only.   If you develop green foul smelling discharge, leave the pessary out for 2 - 3 days.   Use KY jelly or any other personal lubricant that is water based to help with placement and removal.   Wear a sanitary pad until you are sure you understand if you have urinary incontinence with the pessary in place.   Wear biking type shorts with crotch support for exercise until you know that the pessary will not come out easily with maneuvers causing increases in abdominal pressure.  The pessary needs to be removed for sexual activity.   Look before you flush the toilet to be sure the pessary has not come out with straining on the toilet!  Please call for any pain or vaginal bleeding.   

## 2017-08-19 ENCOUNTER — Encounter: Payer: Self-pay | Admitting: Obstetrics and Gynecology

## 2017-08-19 ENCOUNTER — Telehealth: Payer: Self-pay | Admitting: Obstetrics and Gynecology

## 2017-08-19 NOTE — Telephone Encounter (Signed)
Reviewed with Mosie Epstein.   Rosa spoke with patient this morning and advised of appointment with Dr. Matilde Sprang on 09/11/17.   Routing to Dr. Quincy Simmonds, will close encounter.

## 2017-08-19 NOTE — Telephone Encounter (Signed)
Patient sent the following correspondence through [facility]. Routing to triage to assist patient with request.  ----- Message from [place], Generic sent at 08/19/2017 12:01 AM EDT -----    Hi Dr. Quincy Simmonds,  I hope you are having a good week!    I just wanted to let you know that I still havent heard from [facility] or Dr. Virgina Evener office since you sent the request last Monday.     Should I try to contact them or continue to wait to hear from them?    I have an appointment with you on Wednesday. I am doing really well with the pessary and have noticed the pulling/discomfort around my urethra area has subsided. I have also not experienced any additional leakage or incontinence other than the urgency but that was there before the pessary. I feel so much better!    Thank you so much for all you have done for me.    See you Wednesday,  Sheila [name]   Cc: [place]

## 2017-08-20 ENCOUNTER — Other Ambulatory Visit: Payer: Self-pay | Admitting: Family Medicine

## 2017-08-20 ENCOUNTER — Ambulatory Visit: Payer: BC Managed Care – PPO | Admitting: Obstetrics and Gynecology

## 2017-08-20 ENCOUNTER — Other Ambulatory Visit: Payer: Self-pay

## 2017-08-20 ENCOUNTER — Encounter: Payer: Self-pay | Admitting: Obstetrics and Gynecology

## 2017-08-20 VITALS — BP 110/58 | HR 80 | Resp 16 | Ht 62.75 in | Wt 133.0 lb

## 2017-08-20 DIAGNOSIS — N8110 Cystocele, unspecified: Secondary | ICD-10-CM | POA: Diagnosis not present

## 2017-08-20 DIAGNOSIS — Z4689 Encounter for fitting and adjustment of other specified devices: Secondary | ICD-10-CM

## 2017-08-20 NOTE — Progress Notes (Signed)
GYNECOLOGY  VISIT   HPI: 52 y.o.   Married  Caucasian  female   G2P2002 with Patient's last menstrual period was 05/06/2009.   here for pessary check.  No further pulling sensation or pain.  No bleeding. Good bladder and bowel function.  Increasing her activity and able to maintain the pessary.    Taking pessary out every day or two.   Still has some urgency.  No urinary incontinence.   Using vaginal estrogen cream nightly.  Will see Dr. Matilde Sprang on 09/11/17.  Has recurrent UTIs but none in the last month.   GYNECOLOGIC HISTORY: Patient's last menstrual period was 05/06/2009. Contraception:  Hysterectomy Menopausal hormone therapy:  Premarin cream Last mammogram:  01/20/17 BIRADS 1 negative/density c Last pap smear:   08/01/14 Pap and HR HPV negative        OB History    Gravida  2   Para  2   Term  2   Preterm      AB      Living  2     SAB      TAB      Ectopic      Multiple      Live Births                 Patient Active Problem List   Diagnosis Date Noted  . Vaginal adhesions 07/28/2017  . Female bladder prolapse 07/28/2017  . Borderline diabetes 12/05/2015  . Peripheral edema 01/17/2014  . IBS (irritable bowel syndrome) 06/15/2013  . URI, acute 03/25/2013  . Migraine headache 02/21/2013  . Dizziness 01/27/2013  . Depression 10/13/2012  . CONSTIPATION 07/11/2010  . GERD (gastroesophageal reflux disease) 03/23/2007  . HYPERCHOLESTEROLEMIA, PURE 10/14/2006  . GAD (generalized anxiety disorder) 10/13/2006  . ALLERGIC RHINITIS 10/13/2006  . STRESS INCONTINENCE 10/13/2006    Past Medical History:  Diagnosis Date  . Acne   . Allergy    allergic rhinitis  . Anxiety   . Bladder stone 2018  . Depression   . Elevated hemoglobin A1c 2017   5.9  . Hyperlipidemia   . Low serum vitamin D 2017   15    Past Surgical History:  Procedure Laterality Date  . ABDOMINAL HYSTERECTOMY  07/11/2009    laparosccopically assisted vaginal  hysterectomy with anterior and posterior colporrhaphy, transobturator Monarch sling, cystoscopy/ovaries remain  . APPENDECTOMY    . BLADDER SURGERY  03/09  . bladder tack     2nd bladder surgery  . CHOLECYSTECTOMY  2015  . COLLAGEN INJECTION  07/07/2008   Periurethral injection - Dr. Matilde Sprang  . INCONTINENCE SURGERY  08/04/2007   Spark miduethral sling, cystoscopy - Dr. Matilde Sprang  . NASAL SINUS SURGERY  09/2015   Dr.Byers    Current Outpatient Medications  Medication Sig Dispense Refill  . conjugated estrogens (PREMARIN) vaginal cream Use 1/2 g vaginally every night at bed time for the first 2 weeks, then use 1/2 g vaginally twice or three times weekly. 30 g 2  . escitalopram (LEXAPRO) 20 MG tablet TAKE 1 TABLET BY MOUTH EVERY DAY 30 tablet 0  . pantoprazole (PROTONIX) 40 MG tablet TAKE 1 TABLET (40 MG TOTAL) BY MOUTH DAILY. 30 tablet 1   No current facility-administered medications for this visit.      ALLERGIES: Patient has no known allergies.  Family History  Problem Relation Age of Onset  . Diabetes Father   . Diabetes Maternal Grandmother   . Uterine cancer Paternal Aunt   .  Breast cancer Neg Hx   . Colon cancer Neg Hx     Social History   Socioeconomic History  . Marital status: Married    Spouse name: Not on file  . Number of children: Not on file  . Years of education: Not on file  . Highest education level: Not on file  Occupational History  . Not on file  Social Needs  . Financial resource strain: Not on file  . Food insecurity:    Worry: Not on file    Inability: Not on file  . Transportation needs:    Medical: Not on file    Non-medical: Not on file  Tobacco Use  . Smoking status: Never Smoker  . Smokeless tobacco: Never Used  Substance and Sexual Activity  . Alcohol use: No    Alcohol/week: 0.0 oz  . Drug use: No  . Sexual activity: Not Currently    Partners: Male    Birth control/protection: Surgical    Comment: TVH--ovaries remain   Lifestyle  . Physical activity:    Days per week: Not on file    Minutes per session: Not on file  . Stress: Not on file  Relationships  . Social connections:    Talks on phone: Not on file    Gets together: Not on file    Attends religious service: Not on file    Active member of club or organization: Not on file    Attends meetings of clubs or organizations: Not on file    Relationship status: Not on file  . Intimate partner violence:    Fear of current or ex partner: Not on file    Emotionally abused: Not on file    Physically abused: Not on file    Forced sexual activity: Not on file  Other Topics Concern  . Not on file  Social History Narrative   [place]   2 kids   Married 1989    ROS:  Pertinent items are noted in HPI.  PHYSICAL EXAMINATION:    BP (!) 110/58 (BP Location: Right Arm, Patient Position: Sitting, Cuff Size: Normal)   Pulse 80   Resp 16   Ht 5' 2.75" (1.594 m)   Wt 133 lb (60.3 kg)   LMP 05/06/2009   BMI 23.75 kg/m     General appearance: alert, cooperative and appears stated age   Pelvic: External genitalia:  no lesions              Urethra:  normal appearing urethra with no masses, tenderness or lesions              Bartholins and Skenes: normal                 Vagina: normal appearing vagina with normal color and estrogen cream noted, no lesions.  First degree cystocele.   Good apical support.  Good posterior vaginal support.               Cervix:  Absent.                 Bimanual Exam:  Uterus:   Absent.              Adnexa: no mass, fullness, tenderness           Pessary removed, cleansed and replaced.   Chaperone was present for exam.  ASSESSMENT  Cystocele.  Recurrent.  Pelvic pain - resolved with pessary use.  Recurrent UTIs.  I believe that her pain and UTIs are likely due to a hinge effect of the prolapse against her midurethral sling and TOT sling.  Vaginal atrophy.  Status post office exicision of  vaginal apical adhesions.   PLAN  She will reduce the use of the Premarin cream to 1/2 gram 2 - 3 times per week.  She will see Dr. Matilde Sprang regarding her recurrent UTIs and recurrent prolapse.  She may be a good candidate for an anterior colporrhaphy with biological graft placement with Dr. Matilde Sprang and with me present to assist her surgery.    She may want to do a trial of keeping the pessary out for a couple of days sometime prior to the visit with Dr. Matilde Sprang to see if she has recurrent discomfort. She expresses appreciation for her care.  FU for annual exam at the end of this month.    An After Visit Summary was printed and given to the patient.  _15_____ minutes face to face time of which over 50% was spent in counseling.

## 2017-08-28 NOTE — Progress Notes (Signed)
52 y.o. G30P2002 Married Caucasian female here for annual exam.    Doing well with the pessary for cystocele. Pain in vaginal/urethral area is much less with use of the pessary.  Seeing Dr. Matilde Sprang in May for evaluation of recurrent UTIs.   Taking Protonix and Tums from Dr. Buelah Manis and it is not helping. Saw Dr. Collene Mares and was diagnosed with a small hiatal hernia.   PCP: Dr. Vic Blackbird    Patient's last menstrual period was 05/06/2009.           Sexually active: No.  The current method of family planning is status post hysterectomy.    Exercising: Yes.    walking Smoker:  no  Health Maintenance: Pap:  08/01/14 Pap and HR HPV negative History of abnormal Pap:  Yes,Hx LEEP 2006 MMG:  01/20/17 BIRADS 1 negative/density c Colonoscopy:  07/2017 Normal f/u 10 years BMD:   n/a  Result  n/a TDaP:  08/01/14 Gardasil:   n/a HIV and Hep C: 08/17/15 Negative Screening Labs: discuss today   reports that she has never smoked. She has never used smokeless tobacco. She reports that she does not drink alcohol or use drugs.  Past Medical History:  Diagnosis Date  . Acne   . Allergy    allergic rhinitis  . Anxiety   . Bladder stone 2018  . Depression   . Elevated hemoglobin A1c 2017   5.9  . Hyperlipidemia   . Low serum vitamin D 2017   15    Past Surgical History:  Procedure Laterality Date  . ABDOMINAL HYSTERECTOMY  07/11/2009    laparosccopically assisted vaginal hysterectomy with anterior and posterior colporrhaphy, transobturator Monarch sling, cystoscopy/ovaries remain  . APPENDECTOMY    . BLADDER SURGERY  03/09  . bladder tack     2nd bladder surgery  . CHOLECYSTECTOMY  2015  . COLLAGEN INJECTION  07/07/2008   Periurethral injection - Dr. Matilde Sprang  . INCONTINENCE SURGERY  08/04/2007   Spark miduethral sling, cystoscopy - Dr. Matilde Sprang  . NASAL SINUS SURGERY  09/2015   Dr.Byers    Current Outpatient Medications  Medication Sig Dispense Refill  . conjugated estrogens  (PREMARIN) vaginal cream Use 1/2 g vaginally every night at bed time for the first 2 weeks, then use 1/2 g vaginally twice or three times weekly. 30 g 2  . escitalopram (LEXAPRO) 20 MG tablet TAKE 1 TABLET BY MOUTH EVERY DAY 30 tablet 0  . pantoprazole (PROTONIX) 40 MG tablet TAKE 1 TABLET (40 MG TOTAL) BY MOUTH DAILY. 30 tablet 1   No current facility-administered medications for this visit.     Family History  Problem Relation Age of Onset  . Diabetes Father   . Diabetes Maternal Grandmother   . Uterine cancer Paternal Aunt   . Breast cancer Neg Hx   . Colon cancer Neg Hx     Review of Systems  Constitutional: Negative.   HENT: Negative.   Eyes: Negative.   Respiratory: Negative.   Cardiovascular: Negative.   Gastrointestinal: Negative.   Endocrine: Negative.   Genitourinary: Negative.   Musculoskeletal: Negative.   Skin: Negative.   Allergic/Immunologic: Negative.   Neurological: Negative.   Hematological: Negative.   Psychiatric/Behavioral: Negative.     Exam:   BP 110/62 (BP Location: Right Arm, Patient Position: Sitting, Cuff Size: Normal)   Pulse 72   Resp 16   Ht 5' 2.75" (1.594 m)   Wt 133 lb (60.3 kg)   LMP 05/06/2009   BMI 23.75  kg/m     General appearance: alert, cooperative and appears stated age Head: Normocephalic, without obvious abnormality, atraumatic Neck: no adenopathy, supple, symmetrical, trachea midline and thyroid normal to inspection and palpation Lungs: clear to auscultation bilaterally Breasts: normal appearance, no masses or tenderness or left and 5 mm moderately firm mass 11:00 at border of axillary region, No nipple retraction or dimpling, No nipple discharge or bleeding, No axillary or supraclavicular adenopathy Heart: regular rate and rhythm Abdomen: soft, non-tender; no masses, no organomegaly Extremities: extremities normal, atraumatic, no cyanosis or edema Skin: Skin color, texture, turgor normal. No rashes or lesions Lymph nodes:  Cervical, supraclavicular, and axillary nodes normal. No abnormal inguinal nodes palpated Neurologic: Grossly normal  Pelvic: External genitalia:  no lesions              Urethra:  normal appearing urethra with no masses, tenderness or lesions              Bartholins and Skenes: normal                 Vagina: normal appearing vagina with normal color and discharge, no lesions              Cervix: absent.              Pap taken: No. Bimanual Exam:  Uterus: absent              Adnexa: no mass, fullness, tenderness              Rectal exam: Yes.  .  Confirms.              Anus:  normal sphincter tone, no lesions  Pessary removed, cleansed, and replaced.   Chaperone was present for exam.  Assessment:   Well woman visit with normal exam. Cystocele. Treated with pessary.  Vaginal atrophy.  Improved wit vaginal estrogen cream.  Urinary frequency.  Hx recurrent UTIs. Improved.  Small right breast mass.  Hx elevated hemoglobin A1C.  Normal now.  Hx hyperlipidemia.  Plan: Dx right mammogram and right breast US.  Recommended self breast awareness. Pap and HR HPV as above. Guidelines for Calcium, Vitamin D, regular exercise program including cardiovascular and weight bearing exercise. Routine labs. Continue pessary and vaginal estrogen cream twice weekly. Appt with urology regarding recurrent UTIs and cystocele.  May need repair with biologic graft.  I would assist Dr. Matilde Sprang.  Ok to resume intercourse. Follow up annually and prn.   After visit summary provided.

## 2017-08-29 ENCOUNTER — Ambulatory Visit: Payer: BC Managed Care – PPO | Admitting: Obstetrics and Gynecology

## 2017-08-29 ENCOUNTER — Encounter: Payer: Self-pay | Admitting: Obstetrics and Gynecology

## 2017-08-29 ENCOUNTER — Other Ambulatory Visit: Payer: Self-pay

## 2017-08-29 VITALS — BP 110/62 | HR 72 | Resp 16 | Ht 62.75 in | Wt 133.0 lb

## 2017-08-29 DIAGNOSIS — Z01419 Encounter for gynecological examination (general) (routine) without abnormal findings: Secondary | ICD-10-CM

## 2017-08-29 DIAGNOSIS — E870 Hyperosmolality and hypernatremia: Secondary | ICD-10-CM

## 2017-08-29 DIAGNOSIS — N6310 Unspecified lump in the right breast, unspecified quadrant: Secondary | ICD-10-CM

## 2017-08-29 NOTE — Progress Notes (Signed)
Patient scheduled while in office for right breast DX MMG and [place], if needed.Spoek with Clarise Cruz at [facility], schduled for 09/02/17 arriving at 12:20pm for 12:40pm appointment. Patient verbalizes understanding and is agreeable.

## 2017-08-29 NOTE — Patient Instructions (Signed)

## 2017-08-30 LAB — COMPREHENSIVE METABOLIC PANEL
A/G RATIO: 2 (ref 1.2–2.2)
ALT: 18 IU/L (ref 0–32)
AST: 20 IU/L (ref 0–40)
Albumin: 4.7 g/dL (ref 3.5–5.5)
Alkaline Phosphatase: 85 IU/L (ref 39–117)
BUN/Creatinine Ratio: 16 (ref 9–23)
BUN: 11 mg/dL (ref 6–24)
Bilirubin Total: 0.2 mg/dL (ref 0.0–1.2)
CALCIUM: 9.9 mg/dL (ref 8.7–10.2)
CO2: 25 mmol/L (ref 20–29)
CREATININE: 0.69 mg/dL (ref 0.57–1.00)
Chloride: 104 mmol/L (ref 96–106)
GFR, EST AFRICAN AMERICAN: 116 mL/min/{1.73_m2} (ref >59)
GFR, EST NON AFRICAN AMERICAN: 100 mL/min/{1.73_m2} (ref >59)
Globulin, Total: 2.3 g/dL (ref 1.5–4.5)
Glucose: 91 mg/dL (ref 65–99)
POTASSIUM: 4.1 mmol/L (ref 3.5–5.2)
Sodium: 146 mmol/L — ABNORMAL HIGH (ref 134–144)
TOTAL PROTEIN: 7 g/dL (ref 6.0–8.5)

## 2017-08-30 LAB — CBC
Hematocrit: 40.4 % (ref 34.0–46.6)
Hemoglobin: 13.2 g/dL (ref 11.1–15.9)
MCH: 30.3 pg (ref 26.6–33.0)
MCHC: 32.7 g/dL (ref 31.5–35.7)
MCV: 93 fL (ref 79–97)
PLATELETS: 278 10*3/uL (ref 150–379)
RBC: 4.36 x10E6/uL (ref 3.77–5.28)
RDW: 13.4 % (ref 12.3–15.4)
WBC: 5.7 10*3/uL (ref 3.4–10.8)

## 2017-08-30 LAB — LIPID PANEL
Chol/HDL Ratio: 4 ratio (ref 0.0–4.4)
Cholesterol, Total: 210 mg/dL — ABNORMAL HIGH (ref 100–199)
HDL: 53 mg/dL (ref >39)
LDL Calculated: 135 mg/dL — ABNORMAL HIGH (ref 0–99)
Triglycerides: 111 mg/dL (ref 0–149)
VLDL CHOLESTEROL CAL: 22 mg/dL (ref 5–40)

## 2017-08-30 LAB — TSH: TSH: 1.12 u[IU]/mL (ref 0.450–4.500)

## 2017-08-31 ENCOUNTER — Encounter: Payer: Self-pay | Admitting: Obstetrics and Gynecology

## 2017-08-31 NOTE — Addendum Note (Signed)
Addended by: Yisroel Ramming, Dietrich Pates E on: 08/31/2017 08:34 PM   Modules accepted: Orders, SmartSet

## 2017-09-01 ENCOUNTER — Telehealth: Payer: Self-pay | Admitting: Obstetrics and Gynecology

## 2017-09-01 NOTE — Telephone Encounter (Signed)
-----   Message from [place], Generic sent at 08/31/2017 8:48 PM EDT -----    Hi Dr. Quincy Simmonds,  Thank you for sharing the results of my recent bloodwork.     I was fasting when the blood was taken.   I will make a two week appointment to have my sodium level retested.     I go Tuesday to the breast center to check the lump you found. I am anxious but keep reminding myself you were not too concerned.     I hope you are having a good weekend!    Many thanks for all you have done for me, you are the BEST!    Sheila [name]

## 2017-09-01 NOTE — Telephone Encounter (Signed)
Routing to Dr.Silva as FYI. Encounter closed.

## 2017-09-02 ENCOUNTER — Ambulatory Visit
Admission: RE | Admit: 2017-09-02 | Discharge: 2017-09-02 | Disposition: A | Discharge status: Home / Self Care | Payer: BC Managed Care – PPO | Source: Ambulatory Visit | Attending: Obstetrics and Gynecology | Admitting: Obstetrics and Gynecology

## 2017-09-02 DIAGNOSIS — N6310 Unspecified lump in the right breast, unspecified quadrant: Secondary | ICD-10-CM

## 2017-09-09 ENCOUNTER — Telehealth: Payer: Self-pay

## 2017-09-09 NOTE — Telephone Encounter (Signed)
Removed from mammogram hold. Recheck scheduled for 10/22/17 at 1 pm with Dr.Silva. Patient is agreeable to date and time. Encounter closed.

## 2017-09-09 NOTE — Telephone Encounter (Signed)
-----   Message from Nunzio Cobbs, MD sent at 09/02/2017  5:44 PM EDT ----- Please remove from mammogram hold and return to routine screening.  I would like to do a breast recheck in about 6 weeks.  I am following her for other issues also right now, so the timing for the recheck should work well.

## 2017-09-10 ENCOUNTER — Other Ambulatory Visit: Payer: BC Managed Care – PPO

## 2017-09-10 DIAGNOSIS — E870 Hyperosmolality and hypernatremia: Secondary | ICD-10-CM

## 2017-09-11 ENCOUNTER — Telehealth: Payer: Self-pay | Admitting: Obstetrics and Gynecology

## 2017-09-11 ENCOUNTER — Encounter: Payer: Self-pay | Admitting: Obstetrics and Gynecology

## 2017-09-11 LAB — BASIC METABOLIC PANEL
BUN / CREAT RATIO: 15 (ref 9–23)
BUN: 10 mg/dL (ref 6–24)
CHLORIDE: 106 mmol/L (ref 96–106)
CO2: 25 mmol/L (ref 20–29)
Calcium: 9.3 mg/dL (ref 8.7–10.2)
Creatinine, Ser: 0.68 mg/dL (ref 0.57–1.00)
GFR calc Af Amer: 116 mL/min/{1.73_m2} (ref >59)
GFR calc non Af Amer: 101 mL/min/{1.73_m2} (ref >59)
GLUCOSE: 94 mg/dL (ref 65–99)
Potassium: 4.3 mmol/L (ref 3.5–5.2)
SODIUM: 145 mmol/L — AB (ref 134–144)

## 2017-09-11 NOTE — Telephone Encounter (Signed)
Patient sent the following correspondence through [place]. Routing to triage to assist patient with request.  ----- Message from [place], Generic sent at 09/11/2017 2:45 PM EDT -----    Hi sweet Dr. Quincy Simmonds,  I wanted to let you know I had my visit with Dr. Matilde Sprang today. He spoke so highly of you but didnt tell me anything I didnt already know! ; )     I know you will get this information but wanted to go ahead and share it with you. Dr. Matilde Sprang did a cystoscopy today and said it was normal with no evidence of the slings eroding into the bladder so that was good news.    I am returning to him on May 31st for a second urodynamics test and then will follow up to discuss the results on June 14th.     He said he will also review the urodynamics results done in Dr. Zigmund Daniel office.    Thank you for getting me the appointment with Dr. Matilde Sprang.     I am looking forward to working with both of you.    Have a wonderful day!    Sheila [name]

## 2017-09-13 ENCOUNTER — Other Ambulatory Visit: Payer: Self-pay | Admitting: Family Medicine

## 2017-09-30 ENCOUNTER — Other Ambulatory Visit: Payer: Self-pay | Admitting: *Deleted

## 2017-09-30 MED ORDER — PANTOPRAZOLE SODIUM 40 MG PO TBEC: 40.0000 mg | DELAYED_RELEASE_TABLET | Freq: Every day | ORAL | 1 refills | Status: DC

## 2017-10-22 ENCOUNTER — Ambulatory Visit (INDEPENDENT_AMBULATORY_CARE_PROVIDER_SITE_OTHER): Payer: BC Managed Care – PPO | Admitting: Obstetrics and Gynecology

## 2017-10-22 ENCOUNTER — Other Ambulatory Visit: Payer: Self-pay

## 2017-10-22 ENCOUNTER — Encounter: Payer: Self-pay | Admitting: Obstetrics and Gynecology

## 2017-10-22 VITALS — BP 100/58 | HR 72 | Resp 16 | Ht 62.75 in | Wt 133.0 lb

## 2017-10-22 DIAGNOSIS — Z87898 Personal history of other specified conditions: Secondary | ICD-10-CM

## 2017-10-22 DIAGNOSIS — N8110 Cystocele, unspecified: Secondary | ICD-10-CM | POA: Diagnosis not present

## 2017-10-22 NOTE — Progress Notes (Signed)
GYNECOLOGY  VISIT   HPI: 52 y.o.   Married  Caucasian  female   G2P2002 with Patient's last menstrual period was 05/06/2009.   here to discuss appointment with Dr. Matilde Sprang and have a breast recheck.   Saw Dr. Matilde Sprang yesterday.  They discussed a potential vaginal prolapse repair.  He also discussed robotic sacrocolpopexy with Dr. Zigmund Daniel as an alternative due to her recurrent prolapse issues.   She has had urodynamics with Dr. Zigmund Daniel and Dr. Matilde Sprang.  She states she does have dx of overactive bladder.  Impression is that she does not have stress incontinence and that her prior slings have held well.   Pessary and vaginal estrogen has been successful to reduce UTIs.   Able to have sex, once, and no pain.   Seen for annual exam on 08/29/17 and was noted to have a right breast mass - 5 mm moderately firm mass 11:00 at border of axillary region.  Imaging noted below is benign.   GYNECOLOGIC HISTORY: Patient's last menstrual period was 05/06/2009. Contraception:  hysterectomy Menopausal hormone therapy:  Premarin vaginal cream Last mammogram:  01-20-17 birads 1/ density c.  Dx right mammogram and [place] on 09/02/17 - no evidence of malignancy.   Last pap smear:   08-01-14 neg HPV HR neg        OB History    Gravida  2   Para  2   Term  2   Preterm      AB      Living  2     SAB      TAB      Ectopic      Multiple      Live Births                 Patient Active Problem List   Diagnosis Date Noted  . Vaginal adhesions 07/28/2017  . Female bladder prolapse 07/28/2017  . Borderline diabetes 12/05/2015  . Peripheral edema 01/17/2014  . IBS (irritable bowel syndrome) 06/15/2013  . URI, acute 03/25/2013  . Migraine headache 02/21/2013  . Dizziness 01/27/2013  . Depression 10/13/2012  . CONSTIPATION 07/11/2010  . GERD (gastroesophageal reflux disease) 03/23/2007  . HYPERCHOLESTEROLEMIA, PURE 10/14/2006  . GAD (generalized anxiety disorder) 10/13/2006  .  ALLERGIC RHINITIS 10/13/2006  . STRESS INCONTINENCE 10/13/2006    Past Medical History:  Diagnosis Date  . Acne   . Allergy    allergic rhinitis  . Anxiety   . Bladder stone 2018  . Depression   . Elevated hemoglobin A1c 2017   5.9  . Hyperlipidemia   . Low serum vitamin D 2017   15    Past Surgical History:  Procedure Laterality Date  . ABDOMINAL HYSTERECTOMY  07/11/2009    laparosccopically assisted vaginal hysterectomy with anterior and posterior colporrhaphy, transobturator Monarch sling, cystoscopy/ovaries remain  . APPENDECTOMY    . BLADDER SURGERY  03/09  . bladder tack     2nd bladder surgery  . CHOLECYSTECTOMY  2015  . COLLAGEN INJECTION  07/07/2008   Periurethral injection - Dr. Matilde Sprang  . INCONTINENCE SURGERY  08/04/2007   Spark miduethral sling, cystoscopy - Dr. Matilde Sprang  . NASAL SINUS SURGERY  09/2015   Dr.Byers    Current Outpatient Medications  Medication Sig Dispense Refill  . conjugated estrogens (PREMARIN) vaginal cream Use 1/2 g vaginally every night at bed time for the first 2 weeks, then use 1/2 g vaginally twice or three times weekly. 30 g 2  .  escitalopram (LEXAPRO) 20 MG tablet TAKE 1 TABLET BY MOUTH EVERY DAY 90 tablet 0  . pantoprazole (PROTONIX) 40 MG tablet Take 1 tablet (40 mg total) by mouth daily. 90 tablet 1   No current facility-administered medications for this visit.      ALLERGIES: Patient has no known allergies.  Family History  Problem Relation Age of Onset  . Diabetes Father   . Diabetes Maternal Grandmother   . Uterine cancer Paternal Aunt   . Breast cancer Neg Hx   . Colon cancer Neg Hx     Social History   Socioeconomic History  . Marital status: Married    Spouse name: Not on file  . Number of children: Not on file  . Years of education: Not on file  . Highest education level: Not on file  Occupational History  . Not on file  Social Needs  . Financial resource strain: Not on file  . Food insecurity:     Worry: Not on file    Inability: Not on file  . Transportation needs:    Medical: Not on file    Non-medical: Not on file  Tobacco Use  . Smoking status: Never Smoker  . Smokeless tobacco: Never Used  Substance and Sexual Activity  . Alcohol use: No    Alcohol/week: 0.0 oz  . Drug use: No  . Sexual activity: Not Currently    Partners: Male    Birth control/protection: Surgical    Comment: TVH--ovaries remain  Lifestyle  . Physical activity:    Days per week: Not on file    Minutes per session: Not on file  . Stress: Not on file  Relationships  . Social connections:    Talks on phone: Not on file    Gets together: Not on file    Attends religious service: Not on file    Active member of club or organization: Not on file    Attends meetings of clubs or organizations: Not on file    Relationship status: Not on file  . Intimate partner violence:    Fear of current or ex partner: Not on file    Emotionally abused: Not on file    Physically abused: Not on file    Forced sexual activity: Not on file  Other Topics Concern  . Not on file  Social History Narrative   [place]   2 kids   Married 1989    Review of Systems  Constitutional: Negative.   HENT: Negative.   Eyes: Negative.   Respiratory: Negative.   Cardiovascular: Negative.   Gastrointestinal: Negative.   Endocrine: Negative.   Genitourinary: Negative.   Musculoskeletal: Negative.   Skin: Negative.   Allergic/Immunologic: Negative.   Neurological: Negative.   Hematological: Negative.   Psychiatric/Behavioral: Negative.     PHYSICAL EXAMINATION:    BP (!) 100/58 (BP Location: Right Arm, Patient Position: Sitting, Cuff Size: Normal)   Pulse 72   Resp 16   Ht 5' 2.75" (1.594 m)   Wt 133 lb (60.3 kg)   LMP 05/06/2009   BMI 23.75 kg/m     General appearance: alert, cooperative and appears stated age   Breasts: normal appearance, no masses or tenderness, No nipple retraction or  dimpling, No nipple discharge or bleeding, No axillary or supraclavicular adenopathy    Pelvic: External genitalia:  no lesions              Urethra:  normal appearing urethra with no masses,  tenderness or lesions              Bartholins and Skenes: normal                 Vagina: normal appearing vagina with normal color and discharge, no lesions.  Second degree cystocele, good apical vaginal support, and minimal rectocele.              Cervix:  Absent.                Bimanual Exam:  Uterus:  Absent.               Adnexa: no mass, fullness, tenderness              Rectal exam: Yes.  .  Confirms.              Anus:  normal sphincter tone, no lesions  Chaperone was present for exam.  ASSESSMENT  Right breast mass, resolved.  Benign impression with imaging.  Recurrent vaginal prolapse with predominantly a cystocele. Pelvic pain resolved with pessary use.  I believe she has a hinging effect of the slings against her bladder prolapse.   Status post urethral sling x 2.  Status post lysis of vaginal cuff adhesions.  Vaginal atrophy, improved with local vaginal estrogen.  Bacterial vaginosis, treated.   PLAN  The patient and I had a comprehensive discussion of the differences between an augmented vaginal prolapse repair of her cystocele using a biological graft secured to the sacrospinous ligaments versus a laparoscopic robotic sacrocolpopexy with dissection far down the anterior vaginal wall in order to incorporate a cystocele repair in the laparoscopic approach.  We talked about some of the unique and rare complications of each surgery including sciatic nerve pain or deep pelvic bleeding with sacrospinous fixation and bleeding from middle sacral vessels or damage to adjacent anatomic structures with the sacral portion of the sacrocolpopexy.   We also discussed potential mesh erosions and exposures with sacrocolpopexy.  I did highlight the anatomic benefits of the sacrocolpopexy along with  the longevity of the repair that she may achieve with this surgery.  She would like to consult again with Dr. Zigmund Daniel to discuss a potential robotic approach to surgery.    An After Visit Summary was printed and given to the patient.  ___40___ minutes face to face time of which over 50% was spent in counseling.

## 2017-10-22 NOTE — Patient Instructions (Signed)
I will refer you back to Dr. Maryland Pink.

## 2017-12-02 ENCOUNTER — Telehealth: Payer: Self-pay | Admitting: Obstetrics and Gynecology

## 2017-12-02 NOTE — Telephone Encounter (Signed)
I received the consultation letter from Dr. Maryland Pink.  Please contact patient to see if she would like to return to the office to review with me further.

## 2017-12-02 NOTE — Telephone Encounter (Signed)
Spoke with patient. Patient states that she does not wish to return to discuss at this time. Will return call if she changes her mind and would like to schedule.  Routing to provider for final review. Patient agreeable to disposition. Will close encounter.

## 2017-12-02 NOTE — Telephone Encounter (Signed)
Patient called stating she will follow up with Dr.Matthew in January 2020. Patient will have notes sent to Dr.Silva. See note from earlier today.

## 2017-12-15 ENCOUNTER — Other Ambulatory Visit: Payer: Self-pay | Admitting: Family Medicine

## 2017-12-25 ENCOUNTER — Other Ambulatory Visit: Payer: Self-pay | Admitting: Obstetrics and Gynecology

## 2017-12-25 DIAGNOSIS — Z1231 Encounter for screening mammogram for malignant neoplasm of breast: Secondary | ICD-10-CM

## 2018-01-22 ENCOUNTER — Ambulatory Visit
Admission: RE | Admit: 2018-01-22 | Discharge: 2018-01-22 | Disposition: A | Discharge status: Home / Self Care | Payer: BC Managed Care – PPO | Source: Ambulatory Visit | Attending: Obstetrics and Gynecology | Admitting: Obstetrics and Gynecology

## 2018-01-22 DIAGNOSIS — Z1231 Encounter for screening mammogram for malignant neoplasm of breast: Secondary | ICD-10-CM

## 2018-03-11 ENCOUNTER — Other Ambulatory Visit: Payer: Self-pay | Admitting: Family Medicine

## 2018-03-27 ENCOUNTER — Other Ambulatory Visit: Payer: Self-pay | Admitting: Family Medicine

## 2018-06-11 ENCOUNTER — Encounter: Payer: Self-pay | Admitting: Family Medicine

## 2018-06-11 ENCOUNTER — Ambulatory Visit: Payer: BC Managed Care – PPO | Admitting: Family Medicine

## 2018-06-11 VITALS — BP 112/76 | HR 67 | Temp 98.3°F | Resp 15 | Ht 63.0 in | Wt 133.5 lb

## 2018-06-11 DIAGNOSIS — N390 Urinary tract infection, site not specified: Secondary | ICD-10-CM | POA: Diagnosis not present

## 2018-06-11 DIAGNOSIS — R319 Hematuria, unspecified: Secondary | ICD-10-CM

## 2018-06-11 LAB — URINALYSIS, ROUTINE W REFLEX MICROSCOPIC
BILIRUBIN URINE: NEGATIVE
GLUCOSE, UA: NEGATIVE
KETONES UR: NEGATIVE
NITRITE: NEGATIVE
PH: 7 (ref 5.0–8.0)
Protein, ur: NEGATIVE
Specific Gravity, Urine: 1.025 (ref 1.001–1.03)

## 2018-06-11 LAB — MICROSCOPIC MESSAGE

## 2018-06-11 MED ORDER — CEPHALEXIN 500 MG PO CAPS: 500.0000 mg | ORAL_CAPSULE | Freq: Four times a day (QID) | ORAL | 0 refills | Status: DC

## 2018-06-11 NOTE — Progress Notes (Signed)
Patient ID: Sheila [name], female    DOB: Sep 09, 1965, 53 y.o.   MRN: [id number]  PCP: Alycia Rossetti, MD  Chief Complaint  Patient presents with  . Urinary Tract Infection    Patient has c/o burning with urination. Onset 2 days ago. Is followed by uro/gyn for bladder prolapse    Subjective:   Sheila [name] is a 53 y.o. female, presents to clinic with CC of dysuria x 2 d.  Patient has a complicated uro/gyn hx. at her baseline she says that she has bladder spasms, urgency, but for the past 2 days she has constant dysuria, pain is moderate in nature, gets acutely worse while urinating and immediately after urinating pain is described as burning, pain with and after voiding is located in the urethra feels like it "shoots up there."  She denies any hematuria, vaginal discharge, genital rash lesions, flank pain, abdominal pain, nausea vomiting, fever chills sweats decreased appetite.    Dr. Zigmund Daniel - uro-gyn, working with her with prolapsed bladder (1-1.5) and prolapsed rectum, has had bladder sling surgery 2 times, currently using pessary and Premarin cream.  She has had UTIs in the past and BV states that she does not get yeast infections very frequently but with any symptoms with her medical history she wanted to make sure she did not have an infection.  She has had UTI in the past year but it was at a urgent care she does not remember where and I cannot review records.  Her gynecologist diagnosed and treated BV March 2019 and there is positive urine culture January 2019.  She also notes hx of intermittent hematuria of unknown etiology - has seen OBGYN, urology and urogyn specialist and she says they do not know why    Patient Active Problem List   Diagnosis Date Noted  . Vaginal adhesions 07/28/2017  . Female bladder prolapse 07/28/2017  . Borderline diabetes 12/05/2015  . Peripheral edema 01/17/2014  . IBS (irritable bowel syndrome) 06/15/2013  . URI, acute 03/25/2013  . Migraine  headache 02/21/2013  . Dizziness 01/27/2013  . Depression 10/13/2012  . CONSTIPATION 07/11/2010  . GERD (gastroesophageal reflux disease) 03/23/2007  . HYPERCHOLESTEROLEMIA, PURE 10/14/2006  . GAD (generalized anxiety disorder) 10/13/2006  . ALLERGIC RHINITIS 10/13/2006  . STRESS INCONTINENCE 10/13/2006     Prior to Admission medications   Medication Sig Start Date End Date Taking? Authorizing Provider  conjugated estrogens (PREMARIN) vaginal cream Use 1/2 g vaginally every night at bed time for the first 2 weeks, then use 1/2 g vaginally twice or three times weekly. 08/11/17  Yes Nunzio Cobbs, MD  escitalopram (LEXAPRO) 20 MG tablet TAKE 1 TABLET BY MOUTH EVERY DAY 03/11/18  Yes Soddy-Daisy, Modena Nunnery, MD  pantoprazole (PROTONIX) 40 MG tablet TAKE 1 TABLET BY MOUTH EVERY DAY 03/27/18  Yes Kirtland, Modena Nunnery, MD     No Known Allergies   Family History  Problem Relation Age of Onset  . Diabetes Father   . Diabetes Maternal Grandmother   . Uterine cancer Paternal Aunt   . Breast cancer Neg Hx   . Colon cancer Neg Hx      Social History   Socioeconomic History  . Marital status: Married    Spouse name: Not on file  . Number of children: Not on file  . Years of education: Not on file  . Highest education level: Not on file  Occupational History  . Not on file  Social  Needs  . Financial resource strain: Not on file  . Food insecurity:    Worry: Not on file    Inability: Not on file  . Transportation needs:    Medical: Not on file    Non-medical: Not on file  Tobacco Use  . Smoking status: Never Smoker  . Smokeless tobacco: Never Used  Substance and Sexual Activity  . Alcohol use: No    Alcohol/week: 0.0 standard drinks  . Drug use: No  . Sexual activity: Not Currently    Partners: Male    Birth control/protection: Surgical    Comment: TVH--ovaries remain  Lifestyle  . Physical activity:    Days per week: Not on file    Minutes per session: Not on file    . Stress: Not on file  Relationships  . Social connections:    Talks on phone: Not on file    Gets together: Not on file    Attends religious service: Not on file    Active member of club or organization: Not on file    Attends meetings of clubs or organizations: Not on file    Relationship status: Not on file  . Intimate partner violence:    Fear of current or ex partner: Not on file    Emotionally abused: Not on file    Physically abused: Not on file    Forced sexual activity: Not on file  Other Topics Concern  . Not on file  Social History Narrative   [place]   2 kids   Married 1989     Review of Systems  Constitutional: Negative.  Negative for activity change, appetite change, chills, fatigue and fever.  HENT: Negative.   Eyes: Negative.   Respiratory: Negative.   Cardiovascular: Negative.   Gastrointestinal: Negative.   Endocrine: Negative.   Genitourinary: Negative.   Musculoskeletal: Negative.   Skin: Negative.   Allergic/Immunologic: Negative.   Neurological: Negative.   Hematological: Negative.   Psychiatric/Behavioral: Negative.   All other systems reviewed and are negative.      Objective:    Vitals:   06/11/18 1028  BP: 112/76  Pulse: 67  Resp: 15  Temp: 98.3 F (36.8 C)  TempSrc: Oral  SpO2: 97%  Weight: 133 lb 8 oz (60.6 kg)  Height: 5\' 3"  (1.6 m)      Physical Exam Vitals signs and nursing note reviewed.  Constitutional:      General: She is not in acute distress.    Appearance: Normal appearance. She is well-developed. She is not ill-appearing, toxic-appearing or diaphoretic.  HENT:     Head: Normocephalic and atraumatic.     Right Ear: External ear normal.     Left Ear: External ear normal.     Nose: Nose normal.     Mouth/Throat:     Mouth: Mucous membranes are moist.     Pharynx: Oropharynx is clear. Uvula midline.  Eyes:     General: Lids are normal.        Right eye: No discharge.        Left  eye: No discharge.     Conjunctiva/sclera: Conjunctivae normal.     Pupils: Pupils are equal, round, and reactive to light.  Neck:     Musculoskeletal: Normal range of motion and neck supple.     Trachea: Phonation normal. No tracheal deviation.  Cardiovascular:     Rate and Rhythm: Normal rate and regular rhythm.     Pulses: Normal pulses.  Radial pulses are 2+ on the right side and 2+ on the left side.       Posterior tibial pulses are 2+ on the right side and 2+ on the left side.     Heart sounds: Normal heart sounds. No murmur. No friction rub. No gallop.   Pulmonary:     Effort: Pulmonary effort is normal. No respiratory distress.     Breath sounds: Normal breath sounds. No stridor. No wheezing, rhonchi or rales.  Chest:     Chest wall: No tenderness.  Abdominal:     General: Bowel sounds are normal. There is no distension.     Palpations: Abdomen is soft. There is no mass or pulsatile mass.     Tenderness: There is abdominal tenderness (mild) in the suprapubic area. There is no right CVA tenderness, left CVA tenderness, guarding or rebound.  Musculoskeletal: Normal range of motion.        General: No deformity.  Lymphadenopathy:     Cervical: No cervical adenopathy.  Skin:    General: Skin is warm and dry.     Capillary Refill: Capillary refill takes less than 2 seconds.     Coloration: Skin is not pale.     Findings: No rash.  Neurological:     Mental Status: She is alert and oriented to person, place, and time.     Motor: No abnormal muscle tone.     Coordination: Coordination normal.     Gait: Gait normal.  Psychiatric:        Speech: Speech normal.        Behavior: Behavior normal.           Assessment & Plan:      ICD-10-CM   1. Urinary tract infection with hematuria, site unspecified N39.0 Urinalysis, Routine w reflex microscopic   R31.9 Urine Culture    Microscopic Message    Complicated uro/gyn hx, worsening sx from baseline with 2 dysuria,  +leuks and 3+ blood, will tx for UTI with keflex and run culture.    Last micro + was 05/2017 with genus enterococcus  F/up if not improving  Delsa Grana, PA-C 06/11/18 10:40 AM

## 2018-06-13 LAB — URINE CULTURE
MICRO NUMBER:: 161013
SPECIMEN QUALITY:: ADEQUATE

## 2018-06-16 ENCOUNTER — Other Ambulatory Visit: Payer: Self-pay | Admitting: Family Medicine

## 2018-06-16 ENCOUNTER — Encounter: Payer: Self-pay | Admitting: Family Medicine

## 2018-06-16 MED ORDER — SULFAMETHOXAZOLE-TRIMETHOPRIM 800-160 MG PO TABS: 1.0000 | ORAL_TABLET | Freq: Two times a day (BID) | ORAL | 0 refills | Status: AC

## 2018-07-03 ENCOUNTER — Telehealth: Payer: Self-pay | Admitting: *Deleted

## 2018-07-03 ENCOUNTER — Encounter: Payer: Self-pay | Admitting: *Deleted

## 2018-07-03 MED ORDER — CIPROFLOXACIN HCL 500 MG PO TABS: 500.0000 mg | ORAL_TABLET | Freq: Two times a day (BID) | ORAL | 0 refills | Status: AC

## 2018-07-03 NOTE — Telephone Encounter (Signed)
Received call from patient.   Reports that she was seen on 06/11/2018 for UTI. Reports complicated Uro/GYN hx. States that she was given Keflex, but then ABTx changed to Bactrim per culture and sensitivity report.   States that Sx never fully resolved, and now she is having more frequent bladder spasms, urgency, and dysuria. Requested another round of ABTx.  Please advise.

## 2018-07-03 NOTE — Telephone Encounter (Signed)
PCP reviewed and NO given as follows: Cipro 500mg  PO BID x7 days OTC probiotic QD x2 weeks If Sx do not resolve, need to come in for new UA C&S Decrease exercise while on ABTx d/t possible tendon injury in age range.   Call placed to patient and patient made aware.   Prescription sent to pharmacy.

## 2018-07-08 ENCOUNTER — Other Ambulatory Visit: Payer: Self-pay | Admitting: Family Medicine

## 2018-08-05 ENCOUNTER — Emergency Department: Payer: BC Managed Care – PPO

## 2018-08-05 ENCOUNTER — Encounter: Payer: Self-pay | Admitting: Emergency Medicine

## 2018-08-05 ENCOUNTER — Observation Stay
Admission: EM | Admit: 2018-08-05 | Discharge: 2018-08-06 | Disposition: A | Discharge status: Home / Self Care | Payer: BC Managed Care – PPO | Attending: Urology | Admitting: Urology

## 2018-08-05 ENCOUNTER — Other Ambulatory Visit: Payer: Self-pay

## 2018-08-05 DIAGNOSIS — R7303 Prediabetes: Secondary | ICD-10-CM | POA: Insufficient documentation

## 2018-08-05 DIAGNOSIS — K219 Gastro-esophageal reflux disease without esophagitis: Secondary | ICD-10-CM | POA: Insufficient documentation

## 2018-08-05 DIAGNOSIS — F329 Major depressive disorder, single episode, unspecified: Secondary | ICD-10-CM | POA: Diagnosis not present

## 2018-08-05 DIAGNOSIS — N200 Calculus of kidney: Secondary | ICD-10-CM | POA: Diagnosis present

## 2018-08-05 DIAGNOSIS — N132 Hydronephrosis with renal and ureteral calculous obstruction: Principal | ICD-10-CM | POA: Insufficient documentation

## 2018-08-05 DIAGNOSIS — K449 Diaphragmatic hernia without obstruction or gangrene: Secondary | ICD-10-CM | POA: Insufficient documentation

## 2018-08-05 DIAGNOSIS — Z87442 Personal history of urinary calculi: Secondary | ICD-10-CM | POA: Diagnosis not present

## 2018-08-05 HISTORY — DX: Gastro-esophageal reflux disease without esophagitis: K21.9

## 2018-08-05 LAB — CBC WITH DIFFERENTIAL/PLATELET
Abs Immature Granulocytes: 0.05 10*3/uL (ref 0.00–0.07)
Basophils Absolute: 0.1 10*3/uL (ref 0.0–0.1)
Basophils Relative: 0 %
Eosinophils Absolute: 0.1 10*3/uL (ref 0.0–0.5)
Eosinophils Relative: 1 %
HCT: 42 % (ref 36.0–46.0)
Hemoglobin: 14 g/dL (ref 12.0–15.0)
Immature Granulocytes: 0 %
Lymphocytes Relative: 17 %
Lymphs Abs: 1.9 10*3/uL (ref 0.7–4.0)
MCH: 30.6 pg (ref 26.0–34.0)
MCHC: 33.3 g/dL (ref 30.0–36.0)
MCV: 91.7 fL (ref 80.0–100.0)
Monocytes Absolute: 0.6 10*3/uL (ref 0.1–1.0)
Monocytes Relative: 5 %
Neutro Abs: 8.7 10*3/uL — ABNORMAL HIGH (ref 1.7–7.7)
Neutrophils Relative %: 77 %
Platelets: 235 10*3/uL (ref 150–400)
RBC: 4.58 MIL/uL (ref 3.87–5.11)
RDW: 12 % (ref 11.5–15.5)
WBC: 11.4 10*3/uL — ABNORMAL HIGH (ref 4.0–10.5)
nRBC: 0 % (ref 0.0–0.2)

## 2018-08-05 LAB — COMPREHENSIVE METABOLIC PANEL
ALT: 21 U/L (ref 0–44)
AST: 26 U/L (ref 15–41)
Albumin: 4.2 g/dL (ref 3.5–5.0)
Alkaline Phosphatase: 82 U/L (ref 38–126)
Anion gap: 11 (ref 5–15)
BUN: 12 mg/dL (ref 6–20)
CO2: 22 mmol/L (ref 22–32)
Calcium: 9.2 mg/dL (ref 8.9–10.3)
Chloride: 108 mmol/L (ref 98–111)
Creatinine, Ser: 0.8 mg/dL (ref 0.44–1.00)
GFR calc Af Amer: >60 mL/min (ref >60)
GFR calc non Af Amer: >60 mL/min (ref >60)
Glucose, Bld: 146 mg/dL — ABNORMAL HIGH (ref 70–99)
Potassium: 3.6 mmol/L (ref 3.5–5.1)
Sodium: 141 mmol/L (ref 135–145)
Total Bilirubin: 0.6 mg/dL (ref 0.3–1.2)
Total Protein: 7.1 g/dL (ref 6.5–8.1)

## 2018-08-05 LAB — URINALYSIS, COMPLETE (UACMP) WITH MICROSCOPIC
Bacteria, UA: NONE SEEN
Bilirubin Urine: NEGATIVE
Glucose, UA: NEGATIVE mg/dL
Ketones, ur: NEGATIVE mg/dL
Leukocytes,Ua: NEGATIVE
Nitrite: NEGATIVE
Protein, ur: 30 mg/dL — AB
RBC / HPF: >50 RBC/hpf — ABNORMAL HIGH (ref 0–5)
Specific Gravity, Urine: 1.016 (ref 1.005–1.030)
pH: 6 (ref 5.0–8.0)

## 2018-08-05 LAB — LIPASE, BLOOD: Lipase: 22 U/L (ref 11–51)

## 2018-08-05 MED ORDER — PANTOPRAZOLE SODIUM 40 MG PO TBEC: 40.0000 mg | DELAYED_RELEASE_TABLET | Freq: Every day | ORAL | Status: DC

## 2018-08-05 MED ORDER — PROCHLORPERAZINE EDISYLATE 10 MG/2ML IJ SOLN
10.0000 mg | Freq: Four times a day (QID) | INTRAMUSCULAR | Status: DC | PRN
  Administered 2018-08-05: 10 mg via INTRAVENOUS
  Filled 2018-08-05 (×2): qty 2

## 2018-08-05 MED ORDER — HYDROMORPHONE HCL 1 MG/ML IJ SOLN
1.0000 mg | Freq: Once | INTRAMUSCULAR | Status: AC
  Administered 2018-08-05: 1 mg via INTRAVENOUS
  Filled 2018-08-05: qty 1

## 2018-08-05 MED ORDER — OXYCODONE-ACETAMINOPHEN 5-325 MG PO TABS: 1.0000 | ORAL_TABLET | Freq: Three times a day (TID) | ORAL | 0 refills | Status: DC | PRN

## 2018-08-05 MED ORDER — TAMSULOSIN HCL 0.4 MG PO CAPS: 0.4000 mg | ORAL_CAPSULE | Freq: Every day | ORAL | 0 refills | Status: DC

## 2018-08-05 MED ORDER — SODIUM CHLORIDE 0.9 % IV SOLN
INTRAVENOUS | Status: DC
  Administered 2018-08-05 – 2018-08-06 (×2): via INTRAVENOUS

## 2018-08-05 MED ORDER — ONDANSETRON HCL 4 MG/2ML IJ SOLN
4.0000 mg | Freq: Once | INTRAMUSCULAR | Status: AC
  Administered 2018-08-05: 4 mg via INTRAVENOUS
  Filled 2018-08-05: qty 2

## 2018-08-05 MED ORDER — ONDANSETRON 4 MG PO TBDP: 4.0000 mg | ORAL_TABLET | Freq: Three times a day (TID) | ORAL | 0 refills | Status: DC | PRN

## 2018-08-05 MED ORDER — HYDROMORPHONE HCL 1 MG/ML IJ SOLN
0.5000 mg | INTRAMUSCULAR | Status: DC | PRN
  Administered 2018-08-05: 1 mg via INTRAVENOUS
  Filled 2018-08-05: qty 1

## 2018-08-05 MED ORDER — SODIUM CHLORIDE 0.9 % IV SOLN
Freq: Once | INTRAVENOUS | Status: AC
  Administered 2018-08-05: 13:00:00 via INTRAVENOUS

## 2018-08-05 MED ORDER — CEPHALEXIN 500 MG PO CAPS: 500.0000 mg | ORAL_CAPSULE | Freq: Four times a day (QID) | ORAL | 0 refills | Status: DC

## 2018-08-05 MED ORDER — SODIUM CHLORIDE 0.9 % IV SOLN
1.0000 g | Freq: Once | INTRAVENOUS | Status: AC
  Administered 2018-08-05: 1 g via INTRAVENOUS
  Filled 2018-08-05: qty 10

## 2018-08-05 MED ORDER — PANTOPRAZOLE SODIUM 40 MG PO TBEC
40.0000 mg | DELAYED_RELEASE_TABLET | Freq: Every day | ORAL | Status: DC
  Administered 2018-08-05: 40 mg via ORAL
  Filled 2018-08-05: qty 1

## 2018-08-05 MED ORDER — MORPHINE SULFATE (PF) 4 MG/ML IV SOLN
4.0000 mg | Freq: Once | INTRAVENOUS | Status: AC
  Administered 2018-08-05: 4 mg via INTRAVENOUS
  Filled 2018-08-05: qty 1

## 2018-08-05 MED ORDER — ACETAMINOPHEN 325 MG PO TABS: 650.0000 mg | ORAL_TABLET | ORAL | Status: DC | PRN

## 2018-08-05 MED ORDER — OXYCODONE HCL 5 MG PO TABS
5.0000 mg | ORAL_TABLET | ORAL | Status: DC | PRN
  Administered 2018-08-05 – 2018-08-06 (×3): 5 mg via ORAL
  Filled 2018-08-05 (×3): qty 1

## 2018-08-05 MED ORDER — POLYETHYLENE GLYCOL 3350 17 G PO PACK
17.0000 g | PACK | Freq: Every day | ORAL | Status: DC
  Administered 2018-08-05: 17 g via ORAL
  Filled 2018-08-05: qty 1

## 2018-08-05 MED ORDER — ESCITALOPRAM OXALATE 10 MG PO TABS
20.0000 mg | ORAL_TABLET | Freq: Every day | ORAL | Status: DC
  Administered 2018-08-05: 20 mg via ORAL
  Filled 2018-08-05 (×2): qty 2

## 2018-08-05 MED ORDER — ONDANSETRON HCL 4 MG/2ML IJ SOLN
4.0000 mg | INTRAMUSCULAR | Status: DC | PRN
  Administered 2018-08-05 – 2018-08-06 (×2): 4 mg via INTRAVENOUS
  Filled 2018-08-05 (×2): qty 2

## 2018-08-05 MED ORDER — KETOROLAC TROMETHAMINE 30 MG/ML IJ SOLN
30.0000 mg | Freq: Once | INTRAMUSCULAR | Status: AC
  Administered 2018-08-05: 30 mg via INTRAVENOUS
  Filled 2018-08-05: qty 1

## 2018-08-05 MED ORDER — ESCITALOPRAM OXALATE 10 MG PO TABS: 20.0000 mg | ORAL_TABLET | Freq: Every day | ORAL | Status: DC

## 2018-08-05 NOTE — ED Notes (Signed)
Urology at bedside.

## 2018-08-05 NOTE — ED Provider Notes (Addendum)
[facility] Emergency Department Provider Note       Time seen: ----------------------------------------- 1:17 PM on 08/05/2018 -----------------------------------------   I have reviewed the triage vital signs and the nursing notes.  HISTORY   Chief Complaint Flank Pain    HPI Sheila [name] is a 53 y.o. female with a history of anxiety, bladder stone, depression, hyperlipidemia, kidney stones who presents to the ED for right flank pain and nausea today.  Patient states she has kidney stones in the past that were down lower.  She denies any hematuria or dysuria.  Arrives in severe pain.  Past Medical History:  Diagnosis Date  . Acne   . Allergy    allergic rhinitis  . Anxiety   . Bladder stone 2018  . Depression   . Elevated hemoglobin A1c 2017   5.9  . Hyperlipidemia   . Low serum vitamin D 2017   15    Patient Active Problem List   Diagnosis Date Noted  . Vaginal adhesions 07/28/2017  . Female bladder prolapse 07/28/2017  . Borderline diabetes 12/05/2015  . Peripheral edema 01/17/2014  . IBS (irritable bowel syndrome) 06/15/2013  . URI, acute 03/25/2013  . Migraine headache 02/21/2013  . Dizziness 01/27/2013  . Depression 10/13/2012  . CONSTIPATION 07/11/2010  . GERD (gastroesophageal reflux disease) 03/23/2007  . HYPERCHOLESTEROLEMIA, PURE 10/14/2006  . GAD (generalized anxiety disorder) 10/13/2006  . ALLERGIC RHINITIS 10/13/2006  . STRESS INCONTINENCE 10/13/2006    Past Surgical History:  Procedure Laterality Date  . ABDOMINAL HYSTERECTOMY  07/11/2009    laparosccopically assisted vaginal hysterectomy with anterior and posterior colporrhaphy, transobturator Monarch sling, cystoscopy/ovaries remain  . APPENDECTOMY    . BLADDER SURGERY  03/09  . bladder tack     2nd bladder surgery  . CHOLECYSTECTOMY  2015  . COLLAGEN INJECTION  07/07/2008   Periurethral injection - Dr. Matilde Sprang  . INCONTINENCE SURGERY  08/04/2007   Spark  miduethral sling, cystoscopy - Dr. Matilde Sprang  . NASAL SINUS SURGERY  09/2015   Dr.Byers    Allergies Patient has no known allergies.  Social History Social History   Tobacco Use  . Smoking status: Never Smoker  . Smokeless tobacco: Never Used  Substance Use Topics  . Alcohol use: No    Alcohol/week: 0.0 standard drinks  . Drug use: No   Review of Systems Constitutional: Negative for fever. Cardiovascular: Negative for chest pain. Respiratory: Negative for shortness of breath. Gastrointestinal: Positive for flank pain and nausea Musculoskeletal: Negative for back pain. Skin: Negative for rash. Neurological: Negative for headaches, focal weakness or numbness.  All systems negative/normal/unremarkable except as stated in the HPI  ____________________________________________   PHYSICAL EXAM:  VITAL SIGNS: ED Triage Vitals  Enc Vitals Group     BP 08/05/18 1312 132/85     Pulse Rate 08/05/18 1312 (!) 52     Resp 08/05/18 1312 18     Temp 08/05/18 1312 97.9 F (36.6 C)     Temp Source 08/05/18 1312 Oral     SpO2 08/05/18 1312 100 %     Weight 08/05/18 1313 130 lb (59 kg)     Height 08/05/18 1313 5\' 3"  (1.6 m)     Head Circumference --      Peak Flow --      Pain Score 08/05/18 1313 7     Pain Loc --      Pain Edu? --      Excl. in [place]? --    Constitutional:  Alert and oriented.  Mild distress from pain Eyes: Conjunctivae are normal. Normal extraocular movements. ENT      Head: Normocephalic and atraumatic.      Nose: No congestion/rhinnorhea.      Mouth/Throat: Mucous membranes are moist.      Neck: No stridor. Cardiovascular: Normal rate, regular rhythm. No murmurs, rubs, or gallops. Respiratory: Normal respiratory effort without tachypnea nor retractions. Breath sounds are clear and equal bilaterally. No wheezes/rales/rhonchi. Gastrointestinal: Right flank tenderness, no rebound or guarding.  Normal bowel sounds. Musculoskeletal: Nontender with normal range  of motion in extremities. No lower extremity tenderness nor edema. Neurologic:  Normal speech and language. No gross focal neurologic deficits are appreciated.  Skin:  Skin is warm, dry and intact. No rash noted. Psychiatric: Mood and affect are normal. Speech and behavior are normal.  ____________________________________________  ED COURSE:  As part of my medical decision making, I reviewed the following data within the [place] History obtained from family if available, nursing notes, old chart and ekg, as well as notes from prior ED visits. Patient presented for right flank pain, we will assess with labs and imaging as indicated at this time.   Procedures Sheila [name] was evaluated in Emergency Department on 08/05/2018 for the symptoms described in the history of present illness. She was evaluated in the context of the global COVID-19 pandemic, which necessitated consideration that the patient might be at risk for infection with the SARS-CoV-2 virus that causes COVID-19. Institutional protocols and algorithms that pertain to the evaluation of patients at risk for COVID-19 are in a state of rapid change based on information released by regulatory bodies including the CDC and federal and state organizations. These policies and algorithms were followed during the patient's care in the ED.  ____________________________________________   LABS (pertinent positives/negatives)  Labs Reviewed  CBC WITH DIFFERENTIAL/PLATELET - Abnormal; Notable for the following components:      Result Value   WBC 11.4 (*)    Neutro Abs 8.7 (*)    All other components within normal limits  COMPREHENSIVE METABOLIC PANEL - Abnormal; Notable for the following components:   Glucose, Bld 146 (*)    All other components within normal limits  URINALYSIS, COMPLETE (UACMP) WITH MICROSCOPIC - Abnormal; Notable for the following components:   Color, Urine YELLOW (*)    APPearance HAZY (*)    Hgb urine  dipstick LARGE (*)    Protein, ur 30 (*)    RBC / HPF >50 (*)    All other components within normal limits  URINE CULTURE  LIPASE, BLOOD    RADIOLOGY Images were viewed by me  CT renal protocol IMPRESSION: 1. Moderate right-sided hydronephrosis secondary to an obstructing 4 x 5 mm stone in the mid right ureter. 2. Additional small and punctate nephrolithiasis bilaterally greater on the right than the left. 3. Small hiatal hernia. ____________________________________________   DIFFERENTIAL DIAGNOSIS   Renal colic, UTI, pyelonephritis, muscle strain  FINAL ASSESSMENT AND PLAN  Renal colic   Plan: The patient had presented for right flank pain. Patient's labs are grossly unremarkable, urine appeared as expected. Patient's imaging revealed a obstructing 4 x 5 mm stone in the mid right ureter.  Pain was initially controlled but then she had a recurrence of her pain.  I will discuss with urology for evaluation.   Laurence Aly, MD    Note: This note was generated in part or whole with voice recognition software. Voice recognition is usually  quite accurate but there are transcription errors that can and very often do occur. I apologize for any typographical errors that were not detected and corrected.     Earleen Newport, MD 08/05/18 1500    Earleen Newport, MD 08/05/18 [id number]

## 2018-08-05 NOTE — ED Notes (Signed)
ED TO INPATIENT HANDOFF REPORT  ED Nurse Name and Phone #:  Mateo Flow 42  S Name/Age/Gender Sheila [name] 53 y.o. female Room/Bed: ED13A/ED13A  Code Status   Code Status: Not on file  Home/SNF/Other Home Patient oriented to: self, place, time and situation Is this baseline? Yes   Triage Complete: Triage complete  Chief Complaint abd pain  Triage Note Pt via pov from home with right flank pain and nausea today. States she had kidney stones in the past "down lower." Pt denies hematuria or dysuria. Pt alert & oriented, moaning during triage.    Allergies No Known Allergies  Level of Care/Admitting Diagnosis ED Disposition    ED Disposition Condition Comment   Admit  Hospital Area: [place] [[id number]]  Level of Care: Med-Surg [16]  Diagnosis: Ureteral stone with hydronephrosis [102725]  Admitting Physician: Billey Co [[id number]]  Attending Physician: Billey Co [id number]  PT Class (Do Not Modify): Observation [104]  PT Acc Code (Do Not Modify): Observation [10022]       B Medical/Surgery History Past Medical History:  Diagnosis Date  . Acne   . Allergy    allergic rhinitis  . Anxiety   . Bladder stone 2018  . Depression   . Elevated hemoglobin A1c 2017   5.9  . Hyperlipidemia   . Low serum vitamin D 2017   15   Past Surgical History:  Procedure Laterality Date  . ABDOMINAL HYSTERECTOMY  07/11/2009    laparosccopically assisted vaginal hysterectomy with anterior and posterior colporrhaphy, transobturator Monarch sling, cystoscopy/ovaries remain  . APPENDECTOMY    . BLADDER SURGERY  03/09  . bladder tack     2nd bladder surgery  . CHOLECYSTECTOMY  2015  . COLLAGEN INJECTION  07/07/2008   Periurethral injection - Dr. Matilde Sprang  . INCONTINENCE SURGERY  08/04/2007   Spark miduethral sling, cystoscopy - Dr. Matilde Sprang  . NASAL SINUS SURGERY  09/2015   Dr.Byers     A IV Location/Drains/Wounds Patient Lines/Drains/Airways  Status   Active Line/Drains/Airways    Name:   Placement date:   Placement time:   Site:   Days:   Peripheral IV 08/05/18 Right Forearm   08/05/18    1323    Forearm   less than 1          Intake/Output Last 24 hours No intake or output data in the 24 hours ending 08/05/18 1643  Labs/Imaging Results for orders placed or performed during the hospital encounter of 08/05/18 (from the past 48 hour(s))  CBC with Differential     Status: Abnormal   Collection Time: 08/05/18  1:23 PM  Result Value Ref Range   WBC 11.4 (H) 4.0 - 10.5 K/uL   RBC 4.58 3.87 - 5.11 MIL/uL   Hemoglobin 14.0 12.0 - 15.0 g/dL   HCT 42.0 36.0 - 46.0 %   MCV 91.7 80.0 - 100.0 fL   MCH 30.6 26.0 - 34.0 pg   MCHC 33.3 30.0 - 36.0 g/dL   RDW 12.0 11.5 - 15.5 %   Platelets 235 150 - 400 K/uL   nRBC 0.0 0.0 - 0.2 %   Neutrophils Relative % 77 %   Neutro Abs 8.7 (H) 1.7 - 7.7 K/uL   Lymphocytes Relative 17 %   Lymphs Abs 1.9 0.7 - 4.0 K/uL   Monocytes Relative 5 %   Monocytes Absolute 0.6 0.1 - 1.0 K/uL   Eosinophils Relative 1 %   Eosinophils Absolute 0.1 0.0 - 0.5  K/uL   Basophils Relative 0 %   Basophils Absolute 0.1 0.0 - 0.1 K/uL   Immature Granulocytes 0 %   Abs Immature Granulocytes 0.05 0.00 - 0.07 K/uL    Comment: Performed at [facility], [address] [place], [place] [address]  Comprehensive metabolic panel     Status: Abnormal   Collection Time: 08/05/18  1:23 PM  Result Value Ref Range   Sodium 141 135 - 145 mmol/L   Potassium 3.6 3.5 - 5.1 mmol/L   Chloride 108 98 - 111 mmol/L   CO2 22 22 - 32 mmol/L   Glucose, Bld 146 (H) 70 - 99 mg/dL   BUN 12 6 - 20 mg/dL   Creatinine, Ser 0.80 0.44 - 1.00 mg/dL   Calcium 9.2 8.9 - 10.3 mg/dL   Total Protein 7.1 6.5 - 8.1 g/dL   Albumin 4.2 3.5 - 5.0 g/dL   AST 26 15 - 41 U/L   ALT 21 0 - 44 U/L   Alkaline Phosphatase 82 38 - 126 U/L   Total Bilirubin 0.6 0.3 - 1.2 mg/dL   GFR calc non Af Amer >60 >60 mL/min   GFR calc Af Amer >60 >60  mL/min   Anion gap 11 5 - 15    Comment: Performed at [facility], [address] [place], [place] [address]  Lipase, blood     Status: None   Collection Time: 08/05/18  1:23 PM  Result Value Ref Range   Lipase 22 11 - 51 U/L    Comment: Performed at [facility], [address] [place], [place] [address]  Urinalysis, Complete w Microscopic     Status: Abnormal   Collection Time: 08/05/18  1:23 PM  Result Value Ref Range   Color, Urine YELLOW (A) YELLOW   APPearance HAZY (A) CLEAR   Specific Gravity, Urine 1.016 1.005 - 1.030   pH 6.0 5.0 - 8.0   Glucose, UA NEGATIVE NEGATIVE mg/dL   Hgb urine dipstick LARGE (A) NEGATIVE   Bilirubin Urine NEGATIVE NEGATIVE   Ketones, ur NEGATIVE NEGATIVE mg/dL   Protein, ur 30 (A) NEGATIVE mg/dL   Nitrite NEGATIVE NEGATIVE   Leukocytes,Ua NEGATIVE NEGATIVE   RBC / HPF >50 (H) 0 - 5 RBC/hpf   WBC, UA 21-50 0 - 5 WBC/hpf   Bacteria, UA NONE SEEN NONE SEEN   Squamous Epithelial / LPF 0-5 0 - 5   Mucus PRESENT     Comment: Performed at [facility], [address], [place],  [address]   Ct Renal Joaquim Lai Study  Result Date: 08/05/2018 CLINICAL DATA:  53 year old female with right flank pain and nausea. History of kidney stones. EXAM: CT ABDOMEN AND PELVIS WITHOUT CONTRAST TECHNIQUE: Multidetector CT imaging of the abdomen and pelvis was performed following the standard protocol without IV contrast. COMPARISON:  Prior CT scan of the abdomen and pelvis 09/03/2016 FINDINGS: Lower chest: No acute abnormality.  Small hiatal hernia. Hepatobiliary: No focal liver abnormality is seen. Status post cholecystectomy. No biliary dilatation. Pancreas: Unremarkable. No pancreatic ductal dilatation or surrounding inflammatory changes. Spleen: Normal in size without focal abnormality. Adrenals/Urinary Tract: Normal adrenal glands. Moderate right hydronephrosis secondary to a 4 x 5 mm obstructing stone in the mid right ureter. Multiple  additional small stones are present bilaterally, more on the right than the left. The largest residual stone in the right upper pole collecting system measures 3 mm. The remaining stones are punctate at 1-2 mm. Only a single  punctate stone is visualized in the left kidney, in the lower pole collecting system. The bladder is unremarkable in appearance. Stomach/Bowel: No focal bowel wall thickening or evidence of a bowel obstruction. The appendix is surgically absent. Vascular/Lymphatic: Limited evaluation in the absence of intravenous contrast. No evidence of aneurysm or significant atherosclerotic plaque. Reproductive: Surgical changes of prior hysterectomy. No adnexal mass. A pessary is present. Other: No abdominal wall hernia or abnormality. No abdominopelvic ascites. Musculoskeletal: No acute or significant osseous findings. IMPRESSION: 1. Moderate right-sided hydronephrosis secondary to an obstructing 4 x 5 mm stone in the mid right ureter. 2. Additional small and punctate nephrolithiasis bilaterally greater on the right than the left. 3. Small hiatal hernia. Electronically Signed   By: Jacqulynn Cadet M.D.   On: 08/05/2018 13:48    Pending Labs Unresulted Labs (From admission, onward)    Start     Ordered   08/05/18 1455  Urine Culture  Add-on,   AD    Question:  Patient immune status  Answer:  Normal   08/05/18 1454          Vitals/Pain Today's Vitals   08/05/18 1354 08/05/18 1411 08/05/18 1430 08/05/18 1540  BP:   107/69   Pulse:   (!) 54   Resp:   18   Temp:      TempSrc:      SpO2:   100%   Weight:      Height:      PainSc: 0-No pain 0-No pain  8     Isolation Precautions No active isolations  Medications Medications  ketorolac (TORADOL) 30 MG/ML injection 30 mg (30 mg Intravenous Given 08/05/18 1327)  0.9 %  sodium chloride infusion ( Intravenous New Bag/Given 08/05/18 1327)  ondansetron (ZOFRAN) injection 4 mg (4 mg Intravenous Given 08/05/18 1327)  morphine 4 MG/ML  injection 4 mg (4 mg Intravenous Given 08/05/18 1327)  cefTRIAXone (ROCEPHIN) 1 g in sodium chloride 0.9 % 100 mL IVPB (1 g Intravenous New Bag/Given 08/05/18 1546)  HYDROmorphone (DILAUDID) injection 1 mg (1 mg Intravenous Given 08/05/18 1540)  ondansetron (ZOFRAN) injection 4 mg (4 mg Intravenous Given 08/05/18 1540)    Mobility walks Low fall risk   Focused Assessments flank pain to left. hx stones. + vomiting. 2 rounds of pain/nausea meds in ED. plan for lithotripsy in AM   R Recommendations: See Admitting Provider Note  Report given to:   Additional Notes:

## 2018-08-05 NOTE — ED Notes (Addendum)
Pt aware of UA sample. Pt provided with a UA cup and advised to pres call bell for assistance. Call bell within arms reach.

## 2018-08-05 NOTE — H&P (Signed)
08/05/2018 4:48 PM   Sheila [name] 05/26/1965 [id number]  CC: 30mm right mid-ureteral stone, uncontrolled pain and nausea  HPI: I was asked to see Sheila [name] by Dr. Jimmye Norman in consultation for uncontrolled right groin pain and nausea and vomiting secondary to a 6 mm right mid ureteral stone with upstream hydronephrosis on CT.  She is a 53 year old female with a history of multiple bladder procedures for incontinence who presents with 12 hours of severe 10/10 right groin and abdominal pain.  Her pain was briefly controlled with Toradol in the ER, however has returned and is currently poorly controlled.  She does not take any blood thinning medications.  She denies any fevers or chills.  Urinalysis today without signs of infection, greater than 50 RBCs, 20-50 WBCs, no bacteria, nitrite negative.  She denies chest pain or shortness of breath.  She denies any prior surgeries for urolithiasis.   PMH: Past Medical History:  Diagnosis Date  . Acne   . Allergy    allergic rhinitis  . Anxiety   . Bladder stone 2018  . Depression   . Elevated hemoglobin A1c 2017   5.9  . Hyperlipidemia   . Low serum vitamin D 2017   15    Surgical History: Past Surgical History:  Procedure Laterality Date  . ABDOMINAL HYSTERECTOMY  07/11/2009    laparosccopically assisted vaginal hysterectomy with anterior and posterior colporrhaphy, transobturator Monarch sling, cystoscopy/ovaries remain  . APPENDECTOMY    . BLADDER SURGERY  03/09  . bladder tack     2nd bladder surgery  . CHOLECYSTECTOMY  2015  . COLLAGEN INJECTION  07/07/2008   Periurethral injection - Dr. Matilde Sprang  . INCONTINENCE SURGERY  08/04/2007   Spark miduethral sling, cystoscopy - Dr. Matilde Sprang  . NASAL SINUS SURGERY  09/2015   Dr.Byers   Allergies: No Known Allergies  Family History: Family History  Problem Relation Age of Onset  . Diabetes Father   . Diabetes Maternal Grandmother   . Uterine cancer Paternal Aunt   . Breast  cancer Neg Hx   . Colon cancer Neg Hx     Social History:  reports that she has never smoked. She has never used smokeless tobacco. She reports that she does not drink alcohol or use drugs.  ROS: Negative aside from those stated in the HPI.  Physical Exam: BP 119/74   Pulse (!) 58   Temp 97.9 F (36.6 C) (Oral)   Resp 16   Ht 5\' 3"  (1.6 m)   Wt 59 kg   LMP 05/06/2009   SpO2 95%   BMI 23.03 kg/m    Constitutional: Appears uncomfortable Cardiovascular: Regular rate and rhythm Respiratory: Clear to auscultation bilaterally GI: Abdomen is soft, nontender, nondistended, no abdominal masses GU: Right CVA tenderness Lymph: No cervical or inguinal lymphadenopathy. Skin: No rashes, bruises or suspicious lesions. Neurologic: Grossly intact, no focal deficits, moving all 4 extremities. Psychiatric: Normal mood and affect.  Laboratory Data: Reviewed  Urinalysis today nitrite negative, no bacteria, 20-50 WBCs, greater than 50 RBCs  Pertinent Imaging: I have personally reviewed the CT stone protocol.  Right 6 mm mid ureteral stone with upstream hydronephrosis, clearly seen on scout CT, 740HU, 9 cm SSD anteriorly  Assessment & Plan:   In summary, the patient is a healthy 53 year old female with a 6 mm right mid ureteral stone(740HU, 9cm SSD) and poorly controlled pain that cannot be managed as an outpatient.  She has no clinical or laboratory signs of infection.  We discussed various treatment options for urolithiasis including observation with or without medical expulsive therapy, shockwave lithotripsy (SWL), ureteroscopy and laser lithotripsy with stent placement, and percutaneous nephrolithotomy.  We discussed that management is based on stone size, location, density, patient co-morbidities, and patient preference.   Stones <71mm in size have a >80% spontaneous passage rate. Data surrounding the use of tamsulosin for medical expulsive therapy is controversial, but meta analyses  suggests it is most efficacious for distal stones between 5-58mm in size. Possible side effects include dizziness/lightheadedness, and retrograde ejaculation.  SWL has a lower stone free rate in a single procedure, but also a lower complication rate compared to ureteroscopy and avoids a stent and associated stent related symptoms. Possible complications include renal hematoma, steinstrasse, and need for additional treatment.  Ureteroscopy with laser lithotripsy and stent placement has a higher stone free rate than SWL in a single procedure, however increased complication rate including possible infection, ureteral injury, bleeding, and stent related morbidity. Common stent related symptoms include dysuria, urgency/frequency, and flank pain.  After an extensive discussion of the risks and benefits of the above treatment options, the patient would like to proceed with right shockwave lithotripsy.  Admission overnight for pain/nausea control, strain all urine KUB and R SWL tomorrow morning  Billey Co, MD  [facility] [address], [place], [place] [address] [phone]

## 2018-08-05 NOTE — ED Triage Notes (Signed)
Pt via pov from home with right flank pain and nausea today. States she had kidney stones in the past "down lower." Pt denies hematuria or dysuria. Pt alert & oriented, moaning during triage.

## 2018-08-05 NOTE — ED Notes (Signed)
Patient transported to CT 

## 2018-08-06 ENCOUNTER — Encounter
Admission: EM | Disposition: A | Discharge status: Home / Self Care | Payer: Self-pay | Source: Home / Self Care | Attending: Emergency Medicine

## 2018-08-06 ENCOUNTER — Encounter: Payer: Self-pay | Admitting: *Deleted

## 2018-08-06 ENCOUNTER — Observation Stay: Payer: BC Managed Care – PPO

## 2018-08-06 HISTORY — PX: EXTRACORPOREAL SHOCK WAVE LITHOTRIPSY: SHX1557

## 2018-08-06 LAB — URINE CULTURE
Culture: <10000 — AB
Special Requests: NORMAL

## 2018-08-06 SURGERY — LITHOTRIPSY, ESWL
Anesthesia: Choice | Laterality: Right

## 2018-08-06 MED ORDER — SODIUM CHLORIDE 0.9 % IV SOLN
INTRAVENOUS | Status: DC
  Administered 2018-08-06: 08:00:00 via INTRAVENOUS

## 2018-08-06 MED ORDER — ONDANSETRON HCL 4 MG PO TABS: 4.0000 mg | ORAL_TABLET | Freq: Three times a day (TID) | ORAL | 0 refills | Status: DC | PRN

## 2018-08-06 MED ORDER — DIAZEPAM 5 MG PO TABS
10.0000 mg | ORAL_TABLET | Freq: Once | ORAL | Status: AC
  Administered 2018-08-06: 10 mg via ORAL
  Filled 2018-08-06: qty 2

## 2018-08-06 MED ORDER — TAMSULOSIN HCL 0.4 MG PO CAPS: 0.4000 mg | ORAL_CAPSULE | Freq: Every day | ORAL | 0 refills | Status: DC

## 2018-08-06 MED ORDER — OXYCODONE-ACETAMINOPHEN 5-325 MG PO TABS: 1.0000 | ORAL_TABLET | ORAL | 0 refills | Status: AC | PRN

## 2018-08-06 MED ORDER — ONDANSETRON HCL 4 MG/2ML IJ SOLN
4.0000 mg | Freq: Once | INTRAMUSCULAR | Status: AC
  Administered 2018-08-06: 4 mg via INTRAVENOUS
  Filled 2018-08-06: qty 2

## 2018-08-06 MED ORDER — DIPHENHYDRAMINE HCL 25 MG PO CAPS
25.0000 mg | ORAL_CAPSULE | Freq: Once | ORAL | Status: AC
  Administered 2018-08-06: 25 mg via ORAL
  Filled 2018-08-06: qty 1

## 2018-08-06 MED ORDER — KETOROLAC TROMETHAMINE 10 MG PO TABS: 10.0000 mg | ORAL_TABLET | Freq: Four times a day (QID) | ORAL | 0 refills | Status: DC | PRN

## 2018-08-06 MED ORDER — CIPROFLOXACIN HCL 500 MG PO TABS
500.0000 mg | ORAL_TABLET | Freq: Once | ORAL | Status: AC
  Administered 2018-08-06: 500 mg via ORAL
  Filled 2018-08-06: qty 1

## 2018-08-06 NOTE — Discharge Summary (Signed)
Physician Discharge Summary  Patient ID: Sheila [name] MRN: [id number] DOB/AGE: 1965-09-12 53 y.o.  Admit date: 08/05/2018 Discharge date: 08/06/2018  Admission Diagnoses:  Discharge Diagnoses:  Active Problems:   Ureteral stone with hydronephrosis   Discharged Condition: good  Hospital Course:  The patient is a 53 year old healthy female that was admitted on 08/05/2018 with uncontrolled right-sided groin pain and nausea secondary to a 6 mm right mid ureteral stone.  Clinical and laboratory data showed no signs of infection, and she underwent uncomplicated shockwave lithotripsy on 08/06/2018 with excellent results.  The stone was difficult to see at all on fluoroscopy at the conclusion of the procedure.  Her pain and nausea were controlled and she was discharged home in good condition.  Consults: None  Treatments: Right shockwave lithotripsy  Discharge Exam: Blood pressure (!) 95/52, pulse (!) 55, temperature 98.3 F (36.8 C), temperature source Oral, resp. rate 16, height 5\' 3"  (1.6 m), weight 59 kg, last menstrual period 05/06/2009, SpO2 97 %.  Alert, no acute distress Abdomen soft, nontender  Disposition: Discharge disposition: 01-Home or Self Care       Discharge Instructions    Discharge instructions   Complete by:  As directed    See [facility] discharge instructions in chart. Take flomax daily to help the small stone fragments pass through.  OK to take ibuprofen or aleve for any moderate pain. Do not take aleve or ibuprofen with toradol. Take the toradol or narcotics for any severe pain. There will be some small stone fragments in your urine.   Call the clinic if uncontrolled pain or fever over 101.3.  Sheila Madrid, MD 08/06/2018     Allergies as of 08/06/2018   No Known Allergies     Medication List    TAKE these medications   conjugated estrogens vaginal cream Commonly known as:  Premarin Use 1/2 g vaginally every night at bed time for the first 2  weeks, then use 1/2 g vaginally twice or three times weekly.   escitalopram 20 MG tablet Commonly known as:  LEXAPRO TAKE 1 TABLET BY MOUTH EVERY DAY   ketorolac 10 MG tablet Commonly known as:  TORADOL Take 1 tablet (10 mg total) by mouth every 6 (six) hours as needed for moderate pain.   ondansetron 4 MG disintegrating tablet Commonly known as:  Zofran ODT Take 1 tablet (4 mg total) by mouth every 8 (eight) hours as needed for nausea or vomiting.   oxyCODONE-acetaminophen 5-325 MG tablet Commonly known as:  Percocet Take 1 tablet by mouth every 8 (eight) hours as needed.   oxyCODONE-acetaminophen 5-325 MG tablet Commonly known as:  Percocet Take 1 tablet by mouth every 4 (four) hours as needed for up to 5 days for severe pain.   pantoprazole 40 MG tablet Commonly known as:  PROTONIX TAKE 1 TABLET BY MOUTH EVERY DAY   tamsulosin 0.4 MG Caps capsule Commonly known as:  FLOMAX Take 1 capsule (0.4 mg total) by mouth daily after breakfast.   tamsulosin 0.4 MG Caps capsule Commonly known as:  FLOMAX Take 1 capsule (0.4 mg total) by mouth daily after supper.        Signed: [name] 08/06/2018, 12:16 PM

## 2018-08-06 NOTE — Progress Notes (Addendum)
   Subjective Afebrile overnight, continues to have right sided pain and nausea Has strained all urine, no stone  KUB today not able to clearly see right ureteral stone, was visible on CT scout yesterday just lateral to L5  We discussed options including repeat CT, diagnostic ureteroscopy, plain film on SWL truck +/- IV contrast. Risks and benefits, as well as cost/radiation risks discussed.  She would like to proceed with imaging with flouro with contrast on SWL truck, and SWL if stone is visualized  ADDENDUM: Stone clearly seen on fluoro in SWL truck, same location just lateral to L5, no contrast needed. Will proceed with R SWL.   Billey Co, MD

## 2018-08-06 NOTE — OR Nursing (Signed)
Back to inpt room via w/c 1103 am

## 2018-08-06 NOTE — Discharge Instructions (Signed)
Laser Therapy for Kidney Stones, Care After Refer to this sheet in the next few weeks. These instructions provide you with information on caring for yourself after your procedure. Your health care provider may also give you more specific instructions. Your treatment has been planned according to current medical practices, but problems sometimes occur. Call your health care provider if you have any problems or questions after your procedure. What can I expect after the procedure? After the procedure, it is common to have:  Pain.  A burning sensation while urinating.  Small amounts of blood in your urine.  A need to urinate frequently.  Pieces of kidney stone in your urine.  Mild discomfort when urinating that is felt in the tip of the penis in men or in the back. You may experience this if you have a flexible tube (stent) in your ureter. Follow these instructions at home:  Take over-the-counter and prescription medicines only as told by your health care provider.  Take your antibiotic medicine as told by your health care provider. Do not stop taking the antibiotic even if you start to feel better.  Drink enough fluid to keep your urine clear or pale yellow. Your health care provider may recommend drinking two 8-ounce glasses of water per hour for a few hours after your procedure.  Return to your normal activities as told by your health care provider. Ask your health care provider what activities are safe for you.  If your health care provider approves, you may take a warm bath to ease discomfort and burning.  Do notdrivefor 24 hours if you were given a medicine to help you relax (sedative).  Keep all follow-up visits as told by your health care provider. This is important. If you have a stent, you will need to return to your health care provider to have the stent removed. Contact a health care provider if:  You have pain or a burning feeling that lasts more than two days.  You  feel nauseous.  You vomit more and more often.  You have difficulty urinating.  You have pain that gets worse or does not get better with medicine. Get help right away if:  You are unable to urinate, even if your bladder feels full.  You have bright red blood or blood clots in your urine.  You have more blood in your urine.  You have severe pain or discomfort.  You have a fever or shaking chills.  You have abdominal pain. This information is not intended to replace advice given to you by your health care provider. Make sure you discuss any questions you have with your health care provider. Document Released: 05/19/2015 Document Revised: 09/28/2015 Document Reviewed: 03/16/2015 Elsevier Interactive Patient Education  2019 [employer].

## 2018-08-07 ENCOUNTER — Encounter: Payer: Self-pay | Admitting: Urology

## 2018-08-07 ENCOUNTER — Telehealth: Payer: Self-pay | Admitting: Urology

## 2018-08-07 DIAGNOSIS — R110 Nausea: Secondary | ICD-10-CM

## 2018-08-07 MED ORDER — ONDANSETRON HCL 4 MG PO TABS: 4.0000 mg | ORAL_TABLET | Freq: Three times a day (TID) | ORAL | 0 refills | Status: DC | PRN

## 2018-08-07 NOTE — Telephone Encounter (Signed)
Pt would like a refill on Percocet and Zofran.  She also would like to know if she needs to continue the Flomax.  She uses [facility] on [address]

## 2018-08-07 NOTE — Telephone Encounter (Signed)
Called pt informed her of short term RX being sent in yesterday, pt states that she only has 5 tablets left. Pt states she is having abdominal pain and lots of bloating. Pt states that she has gained "10lbs" of water weight. She reports normal eating and drinking habits. BM 2 days ago, however states this is normal for her. She requests more nausea meds, rx given. Also insists that I make provider aware of bloating. Message routed to provider of the day, please advise

## 2018-08-07 NOTE — Telephone Encounter (Signed)
Dr. Jimmye Norman gave her 20 oxycodone on 4/1 and Dr. Diamantina Providence prescribed 12 oxycodone yesterday.  Due to narcotic prescribing guidelines I cannot give additional refills at this time.  Would recommend she continue the tamsulosin.  Dr. Diamantina Providence also refilled the Zofran.

## 2018-08-10 NOTE — Telephone Encounter (Signed)
Please contact patient to see how she is feeling.  If she still complains of excess water gain and bloating and is voiding without problems would recommend she see her PCP.

## 2018-08-10 NOTE — Telephone Encounter (Signed)
See my chart message

## 2018-08-20 ENCOUNTER — Encounter: Payer: Self-pay | Admitting: Urology

## 2018-08-20 ENCOUNTER — Ambulatory Visit (INDEPENDENT_AMBULATORY_CARE_PROVIDER_SITE_OTHER): Payer: BC Managed Care – PPO | Admitting: Urology

## 2018-08-20 ENCOUNTER — Ambulatory Visit
Admission: RE | Admit: 2018-08-20 | Discharge: 2018-08-20 | Disposition: A | Discharge status: Home / Self Care | Payer: BC Managed Care – PPO | Attending: Urology | Admitting: Urology

## 2018-08-20 ENCOUNTER — Other Ambulatory Visit: Payer: Self-pay

## 2018-08-20 ENCOUNTER — Ambulatory Visit
Admission: RE | Admit: 2018-08-20 | Discharge: 2018-08-20 | Disposition: A | Discharge status: Home / Self Care | Payer: BC Managed Care – PPO | Source: Ambulatory Visit | Attending: Urology | Admitting: Urology

## 2018-08-20 VITALS — BP 117/71 | HR 73 | Ht 63.0 in | Wt 130.0 lb

## 2018-08-20 DIAGNOSIS — R110 Nausea: Secondary | ICD-10-CM

## 2018-08-20 DIAGNOSIS — N200 Calculus of kidney: Secondary | ICD-10-CM | POA: Insufficient documentation

## 2018-08-20 NOTE — Addendum Note (Signed)
Addended by: Nickolas Madrid C on: 08/20/2018 09:00 AM   Modules accepted: Orders

## 2018-08-20 NOTE — Progress Notes (Signed)
   08/20/2018 10:17 AM   Sheila [name] 04/08/1966 [id number]  Reason for visit: Follow up s/p R SWL  HPI: I saw Sheila [name] back in urology clinic today in follow-up after shockwave lithotripsy.  Briefly, she is a 53 year old female that I saw in the emergency department on 08/05/2018 for uncontrolled right-sided flank pain secondary to a 6 mm right mid ureteral stone.  She underwent shockwave lithotripsy the next day and was discharged home.  She has been doing very well since that time and denies any recurrent right-sided flank pain or nausea.  She passed a number of small fragments which she brought to clinic today.  She has a complex urologic history from a pelvic floor standpoint.  She has had multiple incontinence procedures, and currently uses a pessary for incontinence.  She also has recurrent urinary tract infections.  She follows with Dr. Maryland Pink, uro-gynecologist at [facility] regarding her recurrent UTIs, incontinence, and pelvic floor dysfunction.   ROS: Please see flowsheet from today's date for complete review of systems.  Physical Exam: BP 117/71   Pulse 73   Ht 5\' 3"  (1.6 m)   Wt 130 lb (59 kg)   LMP 05/06/2009   BMI 23.03 kg/m    Constitutional:  Alert and oriented, No acute distress. Respiratory: Normal respiratory effort, no increased work of breathing. GI: Abdomen is soft, nontender, nondistended, no abdominal masses GU: No CVA tenderness Skin: No rashes, bruises or suspicious lesions. Neurologic: Grossly intact, no focal deficits, moving all 4 extremities. Psychiatric: Normal mood and affect  Laboratory Data: None  Pertinent Imaging:  I have personally reviewed the KUB today, no residual ureteral fragments visualized.  Assessment & Plan:   In summary, the patient is a 53 year old female status post uncomplicated right shockwave lithotripsy for a 6 mm mid ureteral stone.  She had excellent results and her symptoms have  resolved, and KUB today shows no residual fragments.  She has history of pelvic floor dysfunction, recurrent UTIs, and incontinence, and follows with Dr. Zigmund Daniel a urogynecologist at [facility].  She would like to continue follow-up locally in [place] with [place] for her history of stone disease.  We did briefly review other strategies for UTI prevention including cranberry prophylaxis, adequate hydration, and continuing the Premarin cream that she is already using.  We discussed general stone prevention strategies including adequate hydration with goal of producing 2.5 L of urine daily, increasing citric acid intake, increasing calcium intake during high oxalate meals, minimizing animal protein, and decreasing salt intake. Information about dietary recommendations given today.  Stone fragments sent for analysis.  RTC 1 year with KUB Call with stone analysis results  A total of 15 minutes were spent face-to-face with the patient, greater than 50% was spent in patient education, counseling, and coordination of care regarding nephrolithiasis and stone prevention, KUB results, and recurrent UTI prevention.   Billey Co, [facility] [address] [place], [place] [address] [phone]

## 2018-08-20 NOTE — Patient Instructions (Signed)

## 2018-08-27 ENCOUNTER — Other Ambulatory Visit: Payer: Self-pay | Admitting: Urology

## 2018-08-27 NOTE — Progress Notes (Signed)
Please let her know her stone was calcium oxalate, the most common type of stone. Continue stone prevention strategies discussed including increase fluid intake, calcium with meals, low salt diet  Nickolas Madrid, MD 08/27/2018

## 2018-08-31 ENCOUNTER — Telehealth: Payer: Self-pay

## 2018-08-31 ENCOUNTER — Ambulatory Visit: Payer: BC Managed Care – PPO | Admitting: Obstetrics and Gynecology

## 2018-08-31 NOTE — Telephone Encounter (Signed)
Please let her know her stone was calcium oxalate, the most common type of stone. Continue stone prevention strategies discussed including increase fluid intake, calcium with meals, low salt diet  Nickolas Madrid, MD 08/27/2018   Called pt no answer, left detailed message informing her of the information above. Advised pt to call back for questions or concerns.

## 2018-08-31 NOTE — Telephone Encounter (Signed)
-----   Message from Billey Co, MD sent at 08/27/2018  6:20 PM EDT -----   ----- Message ----- From: Delman Cheadle Sent: 08/27/2018  10:56 AM EDT To: Billey Co, MD

## 2018-09-24 DIAGNOSIS — M67449 Ganglion, unspecified hand: Secondary | ICD-10-CM | POA: Insufficient documentation

## 2018-09-30 ENCOUNTER — Other Ambulatory Visit: Payer: Self-pay | Admitting: Family Medicine

## 2018-10-03 ENCOUNTER — Other Ambulatory Visit: Payer: Self-pay | Admitting: Family Medicine

## 2018-10-27 ENCOUNTER — Other Ambulatory Visit: Payer: Self-pay

## 2018-10-29 ENCOUNTER — Ambulatory Visit: Payer: BC Managed Care – PPO | Admitting: Obstetrics and Gynecology

## 2018-11-03 ENCOUNTER — Other Ambulatory Visit: Payer: Self-pay

## 2018-11-03 ENCOUNTER — Ambulatory Visit: Payer: BC Managed Care – PPO | Admitting: Obstetrics and Gynecology

## 2018-11-03 ENCOUNTER — Encounter: Payer: Self-pay | Admitting: Obstetrics and Gynecology

## 2018-11-03 VITALS — BP 112/68 | HR 76 | Temp 97.3°F | Resp 12 | Ht 63.0 in | Wt 131.0 lb

## 2018-11-03 DIAGNOSIS — F329 Major depressive disorder, single episode, unspecified: Secondary | ICD-10-CM | POA: Diagnosis not present

## 2018-11-03 DIAGNOSIS — F32A Depression, unspecified: Secondary | ICD-10-CM

## 2018-11-03 DIAGNOSIS — Z01419 Encounter for gynecological examination (general) (routine) without abnormal findings: Secondary | ICD-10-CM

## 2018-11-03 DIAGNOSIS — R51 Headache: Secondary | ICD-10-CM

## 2018-11-03 DIAGNOSIS — R519 Headache, unspecified: Secondary | ICD-10-CM

## 2018-11-03 DIAGNOSIS — N816 Rectocele: Secondary | ICD-10-CM

## 2018-11-03 DIAGNOSIS — N8110 Cystocele, unspecified: Secondary | ICD-10-CM

## 2018-11-03 DIAGNOSIS — F419 Anxiety disorder, unspecified: Secondary | ICD-10-CM

## 2018-11-03 DIAGNOSIS — G8929 Other chronic pain: Secondary | ICD-10-CM

## 2018-11-03 MED ORDER — PREMARIN 0.625 MG/GM VA CREA: TOPICAL_CREAM | VAGINAL | 2 refills | Status: DC

## 2018-11-03 NOTE — Progress Notes (Signed)
53 y.o. [id number] Married Caucasian female here for annual exam. Patient would like to discuss pessary. Feels as if it "comes down, but not out". Patient would also like to discuss having blood work to screen for diabetes.   Has not worn the pessary for a week now.  It does help to relieve her pressure.  Occasional sharp pain in the pelvis. Still has some urgency.  She does use vaginal estrogen cream.  Small renal stone this year. Had lithotripsy in April, 2020.  Will see Dr. Rodena Piety in January, 2021.  She has some IBS. She had a colonoscopy last year due to abdominal cramping.  This is now improved.   Has chronic headaches. Uses Ibuprofen 800 mg for HA. No prior evaluation.  States she is not stressed.   Struggling with depression and anxiety.  Taking Lexapro.  She and her husband are retired.  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   PCP: Vic Blackbird, MD    Patient's last menstrual period was 05/06/2009.           Sexually active: Yes.   -- occasionally The current method of family planning is status post hysterectomy.    Exercising: No.  The patient does not participate in regular exercise at present. Smoker:  no  Health Maintenance: Pap:  08-01-14 neg HPV HR neg History of abnormal Pap:  Yes,Hx LEEP 2006 MMG:  01/22/18 BIRADS 1 negative/density d Colonoscopy:  07/16/17 Normal f/u 10 years TDaP:  2016 HIV and Hep C: 08/17/15 Negative Screening Labs:  PCP   reports that she has never smoked. She has never used smokeless tobacco. She reports that she does not drink alcohol or use drugs.  Past Medical History:  Diagnosis Date  . Acne   . Allergy    allergic rhinitis  . Anxiety   . Bladder stone 2018  . Depression   . Elevated hemoglobin A1c 2017   5.9  . GERD (gastroesophageal reflux disease)   . Hyperlipidemia   . Low serum vitamin D 2017   15    Past Surgical History:  Procedure Laterality Date  . ABDOMINAL HYSTERECTOMY  07/11/2009    laparosccopically  assisted vaginal hysterectomy with anterior and posterior colporrhaphy, transobturator Monarch sling, cystoscopy/ovaries remain  . APPENDECTOMY    . BLADDER SURGERY  07/2007   cystocele, pessary  . bladder tack     2nd bladder surgery  . CHOLECYSTECTOMY  2015  . COLLAGEN INJECTION  07/07/2008   Periurethral injection - Dr. Matilde Sprang  . CYST REMOVAL HAND    . EXTRACORPOREAL SHOCK WAVE LITHOTRIPSY Right 08/06/2018   Procedure: EXTRACORPOREAL SHOCK WAVE LITHOTRIPSY (ESWL);  Surgeon: Billey Co, MD;  Location: [facility] ORS;  Service: Urology;  Laterality: Right;  . INCONTINENCE SURGERY  08/04/2007   Spark miduethral sling, cystoscopy - Dr. Matilde Sprang  . NASAL SINUS SURGERY  09/2015   Dr.Byers    Current Outpatient Medications  Medication Sig Dispense Refill  . conjugated estrogens (PREMARIN) vaginal cream Use 1/2 g vaginally every night at bed time for the first 2 weeks, then use 1/2 g vaginally twice or three times weekly. 30 g 2  . escitalopram (LEXAPRO) 20 MG tablet TAKE 1 TABLET BY MOUTH EVERY DAY 90 tablet 0  . pantoprazole (PROTONIX) 40 MG tablet TAKE 1 TABLET BY MOUTH EVERY DAY 90 tablet 1   No current facility-administered medications for this visit.     Family History  Problem Relation Age of Onset  . Diabetes Father   .  Diabetes Maternal Grandmother   . Uterine cancer Paternal Aunt   . Breast cancer Neg Hx   . Colon cancer Neg Hx     Review of Systems  Constitutional: Negative.   HENT: Negative.   Eyes: Negative.   Respiratory: Negative.   Cardiovascular: Negative.   Gastrointestinal: Negative.   Endocrine: Negative.   Genitourinary: Negative.   Musculoskeletal: Negative.   Skin: Negative.   Allergic/Immunologic: Negative.   Neurological: Negative.   Hematological: Negative.   Psychiatric/Behavioral: Negative.     Exam:   BP 112/68 (BP Location: Left Arm, Patient Position: Sitting, Cuff Size: Normal)   Pulse 76   Temp (!) 97.3 F (36.3 C) (Temporal)    Resp 12   Ht 5\' 3"  (1.6 m)   Wt 131 lb (59.4 kg)   LMP 05/06/2009   BMI 23.21 kg/m     General appearance: alert, cooperative and appears stated age Head: normocephalic, without obvious abnormality, atraumatic Neck: no adenopathy, supple, symmetrical, trachea midline and thyroid normal to inspection and palpation Lungs: clear to auscultation bilaterally Breasts: normal appearance, no masses or tenderness, No nipple retraction or dimpling, No nipple discharge or bleeding, No axillary adenopathy Heart: regular rate and rhythm Abdomen: soft, non-tender; no masses, no organomegaly Extremities: extremities normal, atraumatic, no cyanosis or edema Skin: skin color, texture, turgor normal. No rashes or lesions Lymph nodes: cervical, supraclavicular, and axillary nodes normal. Neurologic: grossly normal  Pelvic: External genitalia:  no lesions              No abnormal inguinal nodes palpated.              Urethra:  normal appearing urethra with no masses, tenderness or lesions              Bartholins and Skenes: normal                 Vagina: normal appearing vagina with normal color and discharge, no lesions.  Less than second degree cystocele, good apical support, first degree rectocele.               Cervix:  absent              Pap taken: No. Bimanual Exam:  Uterus:   absent              Adnexa: no mass, fullness, tenderness              Rectal exam: Yes.  .  Confirms.              Anus:  normal sphincter tone, no lesions  Chaperone was present for exam.  Assessment:   Well woman visit with normal exam. Cystocele. Treated with pessary.  Vaginal atrophy.  Improved with vaginal estrogen cream.  Urinary frequency.  Hx recurrent UTIs. Renal stones.  Hx elevated hemoglobin A1C.  Normal now.  Hx hyperlipidemia. Hx depression and anxiety.  Chronic HA.   Plan: Mammogram screening discussed. Self breast awareness reviewed. Pap and HR HPV as above. Guidelines for Calcium, Vitamin  D, regular exercise program including cardiovascular and weight bearing exercise. Routine labs including A1C.  Refill of vaginal estrogen cream.  Referral to neurology and psychiatry.  Return for pessary fitting.  Follow up annually and prn.   After visit summary provided.

## 2018-11-03 NOTE — Patient Instructions (Signed)

## 2018-11-04 ENCOUNTER — Telehealth: Payer: Self-pay

## 2018-11-04 ENCOUNTER — Ambulatory Visit: Payer: BC Managed Care – PPO | Admitting: Obstetrics and Gynecology

## 2018-11-04 ENCOUNTER — Encounter: Payer: Self-pay | Admitting: Obstetrics and Gynecology

## 2018-11-04 VITALS — BP 110/70 | HR 72 | Temp 97.9°F | Resp 12 | Ht 63.0 in | Wt 131.0 lb

## 2018-11-04 DIAGNOSIS — N8110 Cystocele, unspecified: Secondary | ICD-10-CM

## 2018-11-04 LAB — LIPID PANEL

## 2018-11-04 NOTE — Telephone Encounter (Signed)
Tried calling patient regarding pessary insertion. No answer, left voicemail for patient to call me back at [phone].

## 2018-11-04 NOTE — Patient Instructions (Signed)
We will call when the pessary is here!

## 2018-11-04 NOTE — Progress Notes (Signed)
GYNECOLOGY  VISIT   HPI: 53 y.o.   Married  Caucasian  female   G2P2002 with Patient's last menstrual period was 05/06/2009.   here for pessary fitting.  She has a #2 ring with support and she feels like the pessary slips with she strains.   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   GYNECOLOGIC HISTORY: Patient's last menstrual period was 05/06/2009. Contraception:  Hysterectomy Menopausal hormone therapy:  Premarin cream Last mammogram:  01/22/18 BIRADS 1 negative/density d Last pap smear:   08/01/14 Neg:Neg HR HPV        OB History    Gravida  2   Para  2   Term  2   Preterm      AB      Living  2     SAB      TAB      Ectopic      Multiple      Live Births                 Patient Active Problem List   Diagnosis Date Noted  . Ureteral stone with hydronephrosis 08/05/2018  . Vaginal adhesions 07/28/2017  . Female bladder prolapse 07/28/2017  . Borderline diabetes 12/05/2015  . Peripheral edema 01/17/2014  . IBS (irritable bowel syndrome) 06/15/2013  . URI, acute 03/25/2013  . Migraine headache 02/21/2013  . Dizziness 01/27/2013  . Depression 10/13/2012  . CONSTIPATION 07/11/2010  . GERD (gastroesophageal reflux disease) 03/23/2007  . HYPERCHOLESTEROLEMIA, PURE 10/14/2006  . GAD (generalized anxiety disorder) 10/13/2006  . ALLERGIC RHINITIS 10/13/2006  . STRESS INCONTINENCE 10/13/2006    Past Medical History:  Diagnosis Date  . Acne   . Allergy    allergic rhinitis  . Anxiety   . Bladder stone 2018  . Depression   . Elevated hemoglobin A1c 2017   5.9  . GERD (gastroesophageal reflux disease)   . Hyperlipidemia   . Low serum vitamin D 2017   15    Past Surgical History:  Procedure Laterality Date  . ABDOMINAL HYSTERECTOMY  07/11/2009    laparosccopically assisted vaginal hysterectomy with anterior and posterior colporrhaphy, transobturator Monarch sling, cystoscopy/ovaries remain  . APPENDECTOMY    . BLADDER SURGERY  07/2007   cystocele, pessary  .  bladder tack     2nd bladder surgery  . CHOLECYSTECTOMY  2015  . COLLAGEN INJECTION  07/07/2008   Periurethral injection - Dr. Matilde Sprang  . CYST REMOVAL HAND    . EXTRACORPOREAL SHOCK WAVE LITHOTRIPSY Right 08/06/2018   Procedure: EXTRACORPOREAL SHOCK WAVE LITHOTRIPSY (ESWL);  Surgeon: Billey Co, MD;  Location: [facility] ORS;  Service: Urology;  Laterality: Right;  . INCONTINENCE SURGERY  08/04/2007   Spark miduethral sling, cystoscopy - Dr. Matilde Sprang  . NASAL SINUS SURGERY  09/2015   Dr.Byers    Current Outpatient Medications  Medication Sig Dispense Refill  . conjugated estrogens (PREMARIN) vaginal cream Use 1/2 g vaginally twice or three times weekly. 30 g 2  . escitalopram (LEXAPRO) 20 MG tablet TAKE 1 TABLET BY MOUTH EVERY DAY 90 tablet 0  . pantoprazole (PROTONIX) 40 MG tablet TAKE 1 TABLET BY MOUTH EVERY DAY 90 tablet 1   No current facility-administered medications for this visit.      ALLERGIES: Patient has no known allergies.  Family History  Problem Relation Age of Onset  . Diabetes Father   . Diabetes Maternal Grandmother   . Uterine cancer Paternal Aunt   . Breast cancer Neg Hx   .  Colon cancer Neg Hx     Social History   Socioeconomic History  . Marital status: Married    Spouse name: richard  . Number of children: Not on file  . Years of education: Not on file  . Highest education level: Not on file  Occupational History    Comment: retired  Scientific laboratory technician  . Financial resource strain: Not on file  . Food insecurity    Worry: Not on file    Inability: Not on file  . Transportation needs    Medical: Not on file    Non-medical: Not on file  Tobacco Use  . Smoking status: Never Smoker  . Smokeless tobacco: Never Used  Substance and Sexual Activity  . Alcohol use: No    Alcohol/week: 0.0 standard drinks  . Drug use: No  . Sexual activity: Yes    Partners: Male    Birth control/protection: Surgical    Comment: TVH--ovaries remain  Lifestyle  .  Physical activity    Days per week: Not on file    Minutes per session: Not on file  . Stress: Not on file  Relationships  . Social Herbalist on phone: Not on file    Gets together: Not on file    Attends religious service: Not on file    Active member of club or organization: Not on file    Attends meetings of clubs or organizations: Not on file    Relationship status: Not on file  . Intimate partner violence    Fear of current or ex partner: Not on file    Emotionally abused: Not on file    Physically abused: Not on file    Forced sexual activity: Not on file  Other Topics Concern  . Not on file  Social History Narrative   [place]   2 kids   Married 1989    Review of Systems  Constitutional: Negative.   HENT: Negative.   Eyes: Negative.   Respiratory: Negative.   Cardiovascular: Negative.   Gastrointestinal: Negative.   Endocrine: Negative.   Genitourinary: Negative.   Musculoskeletal: Negative.   Skin: Negative.   Allergic/Immunologic: Negative.   Neurological: Negative.   Hematological: Negative.   Psychiatric/Behavioral: Negative.     PHYSICAL EXAMINATION:    BP 110/70 (BP Location: Left Arm, Patient Position: Sitting, Cuff Size: Normal)   Pulse 72   Temp 97.9 F (36.6 C) (Temporal)   Resp 12   Ht 5\' 3"  (1.6 m)   Wt 131 lb (59.4 kg)   LMP 05/06/2009   BMI 23.21 kg/m     General appearance: alert, cooperative and appears stated age Head: Normocephalic, without obvious abnormality, atraumatic Neck: no adenopathy, supple, symmetrical, trachea midline and thyroid normal to inspection and palpation Lungs: clear to auscultation bilaterally Breasts: normal appearance, no masses or tenderness, No nipple retraction or dimpling, No nipple discharge or bleeding, No axillary or supraclavicular adenopathy Heart: regular rate and rhythm Abdomen: soft, non-tender, no masses,  no organomegaly Extremities: extremities normal,  atraumatic, no cyanosis or edema Skin: Skin color, texture, turgor normal. No rashes or lesions Lymph nodes: Cervical, supraclavicular, and axillary nodes normal. No abnormal inguinal nodes palpated Neurologic: Grossly normal  Pelvic: External genitalia:  no lesions              Urethra:  normal appearing urethra with no masses, tenderness or lesions              Bartholins  and Skenes: normal                           #3 and #4 ring with support pessaries tried.                Patient is comfortable with the #3 and able to do maneuvers and maintain the pessary.                The #4 ring wish support she can feel a little bit and had discomfort with removal.              Chaperone was present for exam.  ASSESSMENT  Female bladder prolapse.  Pessary fitting completed today.   PLAN  Will order a #3 ring with support pessary.  I clarified use of estrogen cream 1 gram once weekly with IUD removal and replacement at home.  We will call her when the pessary arrives.    An After Visit Summary was printed and given to the patient.  ___15___ minutes face to face time of which over 50% was spent in counseling.

## 2018-11-05 LAB — LIPID PANEL
Chol/HDL Ratio: 4.2 ratio (ref 0.0–4.4)
Cholesterol, Total: 199 mg/dL (ref 100–199)
HDL: 47 mg/dL (ref >39)
LDL Calculated: 126 mg/dL — ABNORMAL HIGH (ref 0–99)
Triglycerides: 130 mg/dL (ref 0–149)
VLDL Cholesterol Cal: 26 mg/dL (ref 5–40)

## 2018-11-05 LAB — HEMOGLOBIN A1C
Est. average glucose Bld gHb Est-mCnc: 117 mg/dL
Hgb A1c MFr Bld: 5.7 % — ABNORMAL HIGH (ref 4.8–5.6)

## 2018-11-05 LAB — COMPREHENSIVE METABOLIC PANEL
ALT: 14 IU/L (ref 0–32)
AST: 22 IU/L (ref 0–40)
Albumin/Globulin Ratio: 2.1 (ref 1.2–2.2)
Albumin: 4.4 g/dL (ref 3.8–4.9)
Alkaline Phosphatase: 89 IU/L (ref 39–117)
BUN/Creatinine Ratio: 21 (ref 9–23)
BUN: 16 mg/dL (ref 6–24)
Bilirubin Total: 0.3 mg/dL (ref 0.0–1.2)
CO2: 22 mmol/L (ref 20–29)
Calcium: 9.7 mg/dL (ref 8.7–10.2)
Chloride: 106 mmol/L (ref 96–106)
Creatinine, Ser: 0.75 mg/dL (ref 0.57–1.00)
GFR calc Af Amer: 105 mL/min/{1.73_m2} (ref >59)
GFR calc non Af Amer: 91 mL/min/{1.73_m2} (ref >59)
Globulin, Total: 2.1 g/dL (ref 1.5–4.5)
Glucose: 101 mg/dL — ABNORMAL HIGH (ref 65–99)
Potassium: 4.3 mmol/L (ref 3.5–5.2)
Sodium: 144 mmol/L (ref 134–144)
Total Protein: 6.5 g/dL (ref 6.0–8.5)

## 2018-11-05 LAB — TSH: TSH: 0.934 u[IU]/mL (ref 0.450–4.500)

## 2018-11-05 LAB — CBC
Hematocrit: 38.7 % (ref 34.0–46.6)
Hemoglobin: 12.9 g/dL (ref 11.1–15.9)
MCH: 30 pg (ref 26.6–33.0)
MCHC: 33.3 g/dL (ref 31.5–35.7)
MCV: 90 fL (ref 79–97)
Platelets: 241 10*3/uL (ref 150–450)
RBC: 4.3 x10E6/uL (ref 3.77–5.28)
RDW: 12.6 % (ref 11.7–15.4)
WBC: 5.8 10*3/uL (ref 3.4–10.8)

## 2018-11-05 LAB — VITAMIN D 25 HYDROXY (VIT D DEFICIENCY, FRACTURES): Vit D, 25-Hydroxy: 24.9 ng/mL — ABNORMAL LOW (ref 30.0–100.0)

## 2018-11-09 ENCOUNTER — Encounter: Payer: Self-pay | Admitting: Obstetrics and Gynecology

## 2018-11-09 NOTE — Telephone Encounter (Signed)
Patient has been scheduled for 11/12/18 for pessary insertion. Closing encounter

## 2018-11-12 ENCOUNTER — Ambulatory Visit: Payer: BC Managed Care – PPO | Admitting: Obstetrics and Gynecology

## 2018-11-12 ENCOUNTER — Encounter: Payer: Self-pay | Admitting: Obstetrics and Gynecology

## 2018-11-12 ENCOUNTER — Other Ambulatory Visit: Payer: Self-pay

## 2018-11-12 VITALS — BP 102/70 | HR 72 | Temp 98.4°F | Resp 12 | Ht 63.0 in | Wt 134.4 lb

## 2018-11-12 DIAGNOSIS — N8110 Cystocele, unspecified: Secondary | ICD-10-CM

## 2018-11-12 NOTE — Patient Instructions (Signed)
You will be seeing Dr. Sarina Ill at [facility] for evaluation of headaches.

## 2018-11-12 NOTE — Progress Notes (Signed)
GYNECOLOGY  VISIT   HPI: 53 y.o.   Married  Caucasian  female   G2P2002 with Patient's last menstrual period was 05/06/2009.   here for pessary insertion    She reported that her current pessary was too small to treat her cystocele.   We fitted her for a #3 ring with support.  It has arrived, so she is here to receive it.   She is asking about the referral to psychiatry and neurology.   GYNECOLOGIC HISTORY: Patient's last menstrual period was 05/06/2009. Contraception:  Hysterectomy Menopausal hormone therapy:  Premarin Cream Last mammogram:  01/22/18 BIRADS 1 negative/density d Last pap smear:   08/01/14 Neg:Neg HR HPV        OB History    Gravida  2   Para  2   Term  2   Preterm      AB      Living  2     SAB      TAB      Ectopic      Multiple      Live Births                 Patient Active Problem List   Diagnosis Date Noted  . Ureteral stone with hydronephrosis 08/05/2018  . Vaginal adhesions 07/28/2017  . Female bladder prolapse 07/28/2017  . Borderline diabetes 12/05/2015  . Peripheral edema 01/17/2014  . IBS (irritable bowel syndrome) 06/15/2013  . URI, acute 03/25/2013  . Migraine headache 02/21/2013  . Dizziness 01/27/2013  . Depression 10/13/2012  . CONSTIPATION 07/11/2010  . GERD (gastroesophageal reflux disease) 03/23/2007  . HYPERCHOLESTEROLEMIA, PURE 10/14/2006  . GAD (generalized anxiety disorder) 10/13/2006  . ALLERGIC RHINITIS 10/13/2006  . STRESS INCONTINENCE 10/13/2006    Past Medical History:  Diagnosis Date  . Acne   . Allergy    allergic rhinitis  . Anxiety   . Bladder stone 2018  . Depression   . Elevated hemoglobin A1c 2017   5.9  . GERD (gastroesophageal reflux disease)   . Hyperlipidemia   . Low serum vitamin D 2017   15    Past Surgical History:  Procedure Laterality Date  . ABDOMINAL HYSTERECTOMY  07/11/2009    laparosccopically assisted vaginal hysterectomy with anterior and posterior colporrhaphy,  transobturator Monarch sling, cystoscopy/ovaries remain  . APPENDECTOMY    . BLADDER SURGERY  07/2007   cystocele, pessary  . bladder tack     2nd bladder surgery  . CHOLECYSTECTOMY  2015  . COLLAGEN INJECTION  07/07/2008   Periurethral injection - Dr. Matilde Sprang  . CYST REMOVAL HAND    . EXTRACORPOREAL SHOCK WAVE LITHOTRIPSY Right 08/06/2018   Procedure: EXTRACORPOREAL SHOCK WAVE LITHOTRIPSY (ESWL);  Surgeon: Billey Co, MD;  Location: [facility] ORS;  Service: Urology;  Laterality: Right;  . INCONTINENCE SURGERY  08/04/2007   Spark miduethral sling, cystoscopy - Dr. Matilde Sprang  . NASAL SINUS SURGERY  09/2015   Dr.Byers    Current Outpatient Medications  Medication Sig Dispense Refill  . cholecalciferol (VITAMIN D3) 25 MCG (1000 UT) tablet Take 1,000 Units by mouth daily.    Marland Kitchen conjugated estrogens (PREMARIN) vaginal cream Use 1/2 g vaginally twice or three times weekly. 30 g 2  . escitalopram (LEXAPRO) 20 MG tablet TAKE 1 TABLET BY MOUTH EVERY DAY 90 tablet 0  . pantoprazole (PROTONIX) 40 MG tablet TAKE 1 TABLET BY MOUTH EVERY DAY 90 tablet 1   No current facility-administered medications for this visit.  ALLERGIES: Patient has no known allergies.  Family History  Problem Relation Age of Onset  . Diabetes Father   . Diabetes Maternal Grandmother   . Uterine cancer Paternal Aunt   . Breast cancer Neg Hx   . Colon cancer Neg Hx     Social History   Socioeconomic History  . Marital status: Married    Spouse name: richard  . Number of children: Not on file  . Years of education: Not on file  . Highest education level: Not on file  Occupational History    Comment: retired  Scientific laboratory technician  . Financial resource strain: Not on file  . Food insecurity    Worry: Not on file    Inability: Not on file  . Transportation needs    Medical: Not on file    Non-medical: Not on file  Tobacco Use  . Smoking status: Never Smoker  . Smokeless tobacco: Never Used  Substance and  Sexual Activity  . Alcohol use: No    Alcohol/week: 0.0 standard drinks  . Drug use: No  . Sexual activity: Yes    Partners: Male    Birth control/protection: Surgical    Comment: TVH--ovaries remain  Lifestyle  . Physical activity    Days per week: Not on file    Minutes per session: Not on file  . Stress: Not on file  Relationships  . Social Herbalist on phone: Not on file    Gets together: Not on file    Attends religious service: Not on file    Active member of club or organization: Not on file    Attends meetings of clubs or organizations: Not on file    Relationship status: Not on file  . Intimate partner violence    Fear of current or ex partner: Not on file    Emotionally abused: Not on file    Physically abused: Not on file    Forced sexual activity: Not on file  Other Topics Concern  . Not on file  Social History Narrative   [place]   2 kids   Married 1989    Review of Systems  Constitutional: Negative.   HENT: Negative.   Eyes: Negative.   Respiratory: Negative.   Cardiovascular: Negative.   Gastrointestinal: Negative.   Endocrine: Negative.   Genitourinary: Negative.   Musculoskeletal: Negative.   Skin: Negative.   Allergic/Immunologic: Negative.   Neurological: Negative.   Hematological: Negative.   Psychiatric/Behavioral: Negative.     PHYSICAL EXAMINATION:    BP 102/70 (BP Location: Right Arm, Patient Position: Sitting, Cuff Size: Normal)   Pulse 72   Temp 98.4 F (36.9 C) (Temporal)   Resp 12   Ht 5\' 3"  (1.6 m)   Wt 134 lb 6.4 oz (61 kg)   LMP 05/06/2009   BMI 23.81 kg/m     General appearance: alert, cooperative and appears stated age  Pelvic: External genitalia:  no lesions              Urethra:  normal appearing urethra with no masses, tenderness or lesions              Bartholins and Skenes: normal                 Vagina: normal appearing vagina with normal color and discharge, no lesions               Cervix: no lesions  Bimanual Exam:  Uterus:  normal size, contour, position, consistency, mobility, non-tender              Adnexa: no mass, fullness, tenderness              #3 ring with support placed in vagina.  Premier pessary.  Lot number - [id number], 2029- 06-30 Patient able to do maneuvers and is comfortable with the pessary and able to maintain it.   Chaperone was present for exam.  ASSESSMENT  Cystocele.  Improved support with new pessary. Referrals in progress.   PLAN  Patient aware of care instructions for pessary.  She will continue to use her vaginal estrogen cream.  Call for vaginal bleeding, green/yellow discharge, or foul smell.  She will take out the pessary if this occurs. Ii reviewed the status of her referrals. FU prn.    An After Visit Summary was printed and given to the patient.  __15____ minutes face to face time of which over 50% was spent in counseling.

## 2018-12-08 ENCOUNTER — Telehealth: Payer: Self-pay | Admitting: *Deleted

## 2018-12-08 NOTE — Telephone Encounter (Signed)
Called pt and LVM asking for cb re: appt 8/17 with Dr. Jaynee Eagles. Left office number in message.

## 2018-12-09 NOTE — Telephone Encounter (Signed)
Called pt on home & cell & LVM asking for call back to let us know if she can come for a 1:00 pm appt on 8/17 instead of 9:00 AM. Left office number in message.

## 2018-12-10 ENCOUNTER — Other Ambulatory Visit: Payer: Self-pay | Admitting: Obstetrics and Gynecology

## 2018-12-10 DIAGNOSIS — Z1231 Encounter for screening mammogram for malignant neoplasm of breast: Secondary | ICD-10-CM

## 2018-12-18 ENCOUNTER — Other Ambulatory Visit: Payer: Self-pay

## 2018-12-18 ENCOUNTER — Ambulatory Visit (INDEPENDENT_AMBULATORY_CARE_PROVIDER_SITE_OTHER): Payer: BC Managed Care – PPO | Admitting: Family Medicine

## 2018-12-18 ENCOUNTER — Encounter: Payer: Self-pay | Admitting: Family Medicine

## 2018-12-18 VITALS — BP 100/64 | HR 65 | Temp 98.8°F | Resp 16 | Ht 63.0 in | Wt 135.0 lb

## 2018-12-18 DIAGNOSIS — G4730 Sleep apnea, unspecified: Secondary | ICD-10-CM

## 2018-12-18 DIAGNOSIS — Z0000 Encounter for general adult medical examination without abnormal findings: Secondary | ICD-10-CM

## 2018-12-18 DIAGNOSIS — R7303 Prediabetes: Secondary | ICD-10-CM

## 2018-12-18 DIAGNOSIS — F330 Major depressive disorder, recurrent, mild: Secondary | ICD-10-CM

## 2018-12-18 DIAGNOSIS — F411 Generalized anxiety disorder: Secondary | ICD-10-CM

## 2018-12-18 MED ORDER — BUPROPION HCL ER (XL) 150 MG PO TB24: 150.0000 mg | ORAL_TABLET | Freq: Every day | ORAL | 2 refills | Status: DC

## 2018-12-18 MED ORDER — PANTOPRAZOLE SODIUM 40 MG PO TBEC: 40.0000 mg | DELAYED_RELEASE_TABLET | Freq: Every day | ORAL | 1 refills | Status: DC

## 2018-12-18 MED ORDER — ESCITALOPRAM OXALATE 20 MG PO TABS: 20.0000 mg | ORAL_TABLET | Freq: Every day | ORAL | 2 refills | Status: DC

## 2018-12-18 NOTE — Patient Instructions (Addendum)
Shingrix vaccine sent to pharmacy  Sleep study may be needed  Referral to therapy in [place]  Start wellbutrin in the morning F/U 2 months lab visit for A1C F/U TELEPHONE VISIT IN 3 WEEKS

## 2018-12-18 NOTE — Progress Notes (Signed)
Subjective:    Patient ID: Sheila [name], female    DOB: April 13, 1966, 53 y.o.   MRN: [id number]  Patient presents for Annual Exam   Pt here for annual visit  Pt followed by  Dr. Zigmund Daniel Uro/GYN at [place]- Dr. Quincy Simmonds, is monitoring pessary  Using premarin cream a few times a week with pessary  She has had a few UTI treated , ellura - supplement has actually improved her bladder symptoms and no UTI while being on this supplement    Had surgery with mucous cyst onf right hand 2nd diit  - Dr. Jackqulyn Livings Hand surgeon    Orthopedics for tendinitis of left shoulder had had injections done- [facility] in [place]  Dr. Janace Hoard- ENT 24th   - had sinus surgery May 2017, now feels like she is stuffy on left side   [place] has noticed when she stops breathing in her sleep when she sleeps on left side , when she sleeps on right side no problems   Urology- had kidney stone back in April- Dr. Diamantina Providence - told nother stones, she had lithotriospy     Vitamin D def- on vitamin D daily   Borderline DM - watching diet  GAD known history currently on lexapro which has helped, does not feel as anxious but more depressed and sad, doesn't want to do her regular activites. Stressed over her general health. Discussed with ehr GYN who was trying to get her set up with a psychiatrist   No SI   Review Of Systems:  GEN- denies fatigue, fever, weight loss,weakness, recent illness HEENT- denies eye drainage, change in vision, nasal discharge, CVS- denies chest pain, palpitations RESP- denies SOB, cough, wheeze ABD- denies N/V, change in stools, abd pain GU- denies dysuria, hematuria, dribbling, incontinence MSK- denies joint pain, muscle aches, injury Neuro- denies headache, dizziness, syncope, seizure activity       Objective:    BP 100/64   Pulse 65   Temp 98.8 F (37.1 C) (Oral)   Resp 16   Ht 5\' 3"  (1.6 m)   Wt 135 lb (61.2 kg)   LMP 05/06/2009   SpO2 98%   BMI 23.91 kg/m   GEN- NAD, alert and oriented x3 HEENT- PERRL, EOMI, non injected sclera, pink conjunctiva, MMM, oropharynx clear, nares clear, TM clear no effusion  Deviated septum Neck- Supple, no thyromegaly CVS- RRR, no murmur RESP-CTAB ABD-NABS,soft,NT,ND Psych- normal affect and mood  EXT- No edema Pulses- Radial, DP- 2+        Assessment & Plan:      Problem List Items Addressed This Visit      Unprioritized   Borderline diabetes   Depression    Add wellbutrin 150mg  XL in AM to her lexapro at bedtime Referral for psychotherapy F/u via phone in a couple of weeks for meds      Relevant Medications   buPROPion (WELLBUTRIN XL) 150 MG 24 hr tablet   escitalopram (LEXAPRO) 20 MG tablet   GAD (generalized anxiety disorder)   Relevant Medications   buPROPion (WELLBUTRIN XL) 150 MG 24 hr tablet   escitalopram (LEXAPRO) 20 MG tablet   Sleep apnea    Concern for sleep apnea based on symptoms Has ENT appt coming up, if not felt to be due to nasal obstruction Will obtain sleep study       Other Visit Diagnoses    Routine general medical examination at a health care facility    -  Primary  CPE done, prevention UTD,  Shingrix gto pharmacy ,will have repeat A1C in a couple of months      Note: This dictation was prepared with Dragon dictation along with smaller phrase technology. Any transcriptional errors that result from this process are unintentional.

## 2018-12-20 DIAGNOSIS — G4730 Sleep apnea, unspecified: Secondary | ICD-10-CM | POA: Insufficient documentation

## 2018-12-20 NOTE — Assessment & Plan Note (Signed)
Add wellbutrin 150mg  XL in AM to her lexapro at bedtime Referral for psychotherapy F/u via phone in a couple of weeks for meds

## 2018-12-20 NOTE — Assessment & Plan Note (Signed)
Concern for sleep apnea based on symptoms Has ENT appt coming up, if not felt to be due to nasal obstruction Will obtain sleep study

## 2018-12-21 ENCOUNTER — Other Ambulatory Visit: Payer: Self-pay

## 2018-12-21 ENCOUNTER — Ambulatory Visit: Payer: BC Managed Care – PPO | Admitting: Neurology

## 2018-12-21 ENCOUNTER — Encounter: Payer: Self-pay | Admitting: Neurology

## 2018-12-21 VITALS — BP 108/66 | HR 65 | Temp 97.8°F | Ht 63.0 in | Wt 134.0 lb

## 2018-12-21 DIAGNOSIS — G4733 Obstructive sleep apnea (adult) (pediatric): Secondary | ICD-10-CM

## 2018-12-21 DIAGNOSIS — R519 Headache, unspecified: Secondary | ICD-10-CM

## 2018-12-21 DIAGNOSIS — R51 Headache: Secondary | ICD-10-CM | POA: Diagnosis not present

## 2018-12-21 DIAGNOSIS — F330 Major depressive disorder, recurrent, mild: Secondary | ICD-10-CM | POA: Diagnosis not present

## 2018-12-21 DIAGNOSIS — G8929 Other chronic pain: Secondary | ICD-10-CM | POA: Insufficient documentation

## 2018-12-21 MED ORDER — NORTRIPTYLINE HCL 25 MG PO CAPS: 25.0000 mg | ORAL_CAPSULE | Freq: Every day | ORAL | 11 refills | Status: DC

## 2018-12-21 NOTE — Progress Notes (Signed)
[facility]  PATIENT: Sheila [name] DOB: 1965/08/01  REFERRING DOCTOR OR PCP: Vic Blackbird MD SOURCE: Patient, notes from primary care, notes from ENT.  _________________________________   HISTORICAL  CHIEF COMPLAINT:  Chief Complaint  Patient presents with  . New Patient (Initial Visit)    RM 12, alone. Internal referral for headaches. Started years ago. Has dull headaches and varies in severity.  Takes ibuprofen 800mg . Has a severe deviated septum. She had surgery 09/2015 on left nostril. Husband has witnessed episodes where she stops breathing while sleeping. She follows ENT.     HISTORY OF PRESENT ILLNESS:  I had the pleasure seeing your patient, Sheila [name], at [facility] for neurologic consultation regarding her headaches.  She is a 53 year old woman who has had headaches for many years.    She is experiencing headaches 15-20 days a month lasting most of the day.   Often she wakes up with a headache.   A typical headache comes on a dull ache in the forehead or occiput.  Pain intensifies as the day goes on.    If headache is severe, she gets nausea but not vomiting.   She denies photophobia but has some phonophobia.   Moving her head does not change the pain much.     If she takes ibuprofen 800 mg the headache sometimes improves but not always.  Imitrex may have helped some but she felt sick with it.   Sometimes massaging her forehead reduced.  She has never been on a daily prophylactic agent for the headaches.  She used to get frequent sinus infection but these improved after surgery for a deviated septum.      She had teeth pulled do to her jaw being small.     Her husband notes that she snores and will have pauses in breathing at night, sometimes followed by a jerk or snort.   She gets 8 hours of sleep.   A Fitbit reportedly showed she wakes up lot at night but she does not have recall.  She has depression that is doing reasonably well  on Wellbutrin plus escitalopram.  EPWORTH SLEEPINESS SCALE  On a scale of 0 - 3 what is the chance of dozing:  Sitting and Reading:   3 Watching TV:    1 Sitting inactive in a public place: 0 Passenger in car for one hour: 0 Lying down to rest in the afternoon: 3 Sitting and talking to someone: 0 Sitting quietly after lunch:  2 In a car, stopped in traffic:  0  Total (out of 24):   9/24   Borderline sleepiness  Notes from primary care and lab work were reviewed  REVIEW OF SYSTEMS: Constitutional: No fevers, chills, sweats, or change in appetite Eyes: No visual changes, double vision, eye pain Ear, nose and throat: No hearing loss, ear pain, nasal congestion, sore throat.  She she has had nasal septum surgery Cardiovascular: No chest pain, palpitations Respiratory: No shortness of breath at rest or with exertion.   No wheezes GastrointestinaI: No nausea, vomiting, diarrhea, abdominal pain, fecal incontinence Genitourinary: No dysuria, urinary retention or frequency.  No nocturia. Musculoskeletal: No neck pain, back pain Integumentary: No rash, pruritus, skin lesions Neurological: as above Psychiatric: No depression at this time.  No anxiety Endocrine: No palpitations, diaphoresis, change in appetite, change in weigh or increased thirst Hematologic/Lymphatic: No anemia, purpura, petechiae. Allergic/Immunologic: No itchy/runny eyes, nasal congestion, recent allergic reactions, rashes  ALLERGIES: No Known Allergies  HOME MEDICATIONS:  Current Outpatient Medications:  .  buPROPion (WELLBUTRIN XL) 150 MG 24 hr tablet, Take 1 tablet (150 mg total) by mouth daily., Disp: 30 tablet, Rfl: 2 .  cholecalciferol (VITAMIN D3) 25 MCG (1000 UT) tablet, Take 1,000 Units by mouth daily., Disp: , Rfl:  .  conjugated estrogens (PREMARIN) vaginal cream, Use 1/2 g vaginally twice or three times weekly., Disp: 30 g, Rfl: 2 .  escitalopram (LEXAPRO) 20 MG tablet, Take 1 tablet (20 mg total)  by mouth daily., Disp: 90 tablet, Rfl: 2 .  pantoprazole (PROTONIX) 40 MG tablet, Take 1 tablet (40 mg total) by mouth daily., Disp: 90 tablet, Rfl: 1  PAST MEDICAL HISTORY: Past Medical History:  Diagnosis Date  . Acne   . Allergy    allergic rhinitis  . Anxiety   . Bladder stone 2018  . Depression   . Elevated hemoglobin A1c 2017   5.9  . GERD (gastroesophageal reflux disease)   . Hyperlipidemia   . Low serum vitamin D 2017   15    PAST SURGICAL HISTORY: Past Surgical History:  Procedure Laterality Date  . ABDOMINAL HYSTERECTOMY  07/11/2009    laparosccopically assisted vaginal hysterectomy with anterior and posterior colporrhaphy, transobturator Monarch sling, cystoscopy/ovaries remain  . APPENDECTOMY    . BLADDER SURGERY  07/2007   cystocele, pessary  . bladder tack     2nd bladder surgery  . CHOLECYSTECTOMY  2015  . COLLAGEN INJECTION  07/07/2008   Periurethral injection - Dr. Matilde Sprang  . CYST REMOVAL HAND    . EXTRACORPOREAL SHOCK WAVE LITHOTRIPSY Right 08/06/2018   Procedure: EXTRACORPOREAL SHOCK WAVE LITHOTRIPSY (ESWL);  Surgeon: Billey Co, MD;  Location: [facility] ORS;  Service: Urology;  Laterality: Right;  . INCONTINENCE SURGERY  08/04/2007   Spark miduethral sling, cystoscopy - Dr. Matilde Sprang  . NASAL SINUS SURGERY  09/2015   Dr.Byers    FAMILY HISTORY: Family History  Problem Relation Age of Onset  . Diabetes Father   . Diabetes Maternal Grandmother   . Uterine cancer Paternal Aunt   . Breast cancer Neg Hx   . Colon cancer Neg Hx     SOCIAL HISTORY:  Social History   Socioeconomic History  . Marital status: Married    Spouse name: Cornella Emmer "Darren"  . Number of children: Not on file  . Years of education: Not on file  . Highest education level: Not on file  Occupational History    Comment: retired  Scientific laboratory technician  . Financial resource strain: Not on file  . Food insecurity    Worry: Not on file    Inability: Not on file  . Transportation  needs    Medical: Not on file    Non-medical: Not on file  Tobacco Use  . Smoking status: Never Smoker  . Smokeless tobacco: Never Used  Substance and Sexual Activity  . Alcohol use: No    Alcohol/week: 0.0 standard drinks  . Drug use: No  . Sexual activity: Yes    Partners: Male    Birth control/protection: Surgical    Comment: TVH--ovaries remain  Lifestyle  . Physical activity    Days per week: Not on file    Minutes per session: Not on file  . Stress: Not on file  Relationships  . Social Herbalist on phone: Not on file    Gets together: Not on file    Attends religious service: Not on file    Active member of  club or organization: Not on file    Attends meetings of clubs or organizations: Not on file    Relationship status: Not on file  . Intimate partner violence    Fear of current or ex partner: Not on file    Emotionally abused: Not on file    Physically abused: Not on file    Forced sexual activity: Not on file  Other Topics Concern  . Not on file  Social History Narrative   Lives with husband   Caffeine use: 24-32 oz per day    [place]   2 kids   Married 1989     PHYSICAL EXAM  Vitals:   12/21/18 0829  BP: 108/66  Pulse: 65  Temp: 97.8 F (36.6 C)  Weight: 134 lb (60.8 kg)  Height: 5\' 3"  (1.6 m)    Body mass index is 23.74 kg/m.   General: The patient is well-developed and well-nourished and in no acute distress  HEENT:  Head is [place]/AT.  Sclera are anicteric.  Funduscopic exam shows normal optic discs and retinal vessels.  Neck: No carotid bruits are noted.  The neck is nontender.  Range of motion is good  Cardiovascular: The heart has a regular rate and rhythm with a normal S1 and S2. There were no murmurs, gallops or rubs.    Skin: Extremities are without rash or  edema.  Neurologic Exam  Mental status: The patient is alert and oriented x 3 at the time of the examination. The patient has apparent  normal recent and remote memory, with an apparently normal attention span and concentration ability.   Speech is normal.  Cranial nerves: Extraocular movements are full. Pupils are equal, round, and reactive to light and accomodation.  Visual fields are full.  Facial symmetry is present. There is good facial sensation to soft touch bilaterally.Facial strength is normal.  Trapezius and sternocleidomastoid strength is normal. No dysarthria is noted.  The tongue is midline, and the patient has symmetric elevation of the soft palate. No obvious hearing deficits are noted.  Motor:  Muscle bulk is normal.   Tone is normal. Strength is  5 / 5 in all 4 extremities.   Sensory: Sensory testing is intact to pinprick, soft touch and vibration sensation in all 4 extremities.  Coordination: Cerebellar testing reveals good finger-nose-finger and heel-to-shin bilaterally.  Gait and station: Station is normal.   Gait is normal. Tandem gait is normal. Romberg is negative.   Reflexes: Deep tendon reflexes are symmetric and 3+ at the knees and 2 elsewhere and symmetric .   Plantar responses are flexor.    DIAGNOSTIC DATA (LABS, IMAGING, TESTING) - I reviewed patient records, labs, notes, testing and imaging myself where available.  Lab Results  Component Value Date   WBC 5.8 11/03/2018   HGB 12.9 11/03/2018   HCT 38.7 11/03/2018   MCV 90 11/03/2018   PLT 241 11/03/2018      Component Value Date/Time   NA 144 11/03/2018 1021   NA 142 03/27/2014 0559   K 4.3 11/03/2018 1021   K 3.7 03/27/2014 0559   CL 106 11/03/2018 1021   CL 108 (H) 03/27/2014 0559   CO2 22 11/03/2018 1021   CO2 25 03/27/2014 0559   GLUCOSE 101 (H) 11/03/2018 1021   GLUCOSE 146 (H) 08/05/2018 1323   GLUCOSE 117 (H) 03/27/2014 0559   BUN 16 11/03/2018 1021   BUN 12 03/27/2014 0559   CREATININE 0.75 11/03/2018 1021  CREATININE 0.71 08/26/2016 0853   CALCIUM 9.7 11/03/2018 1021   CALCIUM 9.5 03/27/2014 0559   PROT 6.5  11/03/2018 1021   PROT 7.4 03/27/2014 0559   ALBUMIN 4.4 11/03/2018 1021   ALBUMIN 3.9 03/27/2014 0559   AST 22 11/03/2018 1021   AST 27 03/27/2014 0559   ALT 14 11/03/2018 1021   ALT 22 03/27/2014 0559   ALKPHOS 89 11/03/2018 1021   ALKPHOS 82 03/27/2014 0559   BILITOT 0.3 11/03/2018 1021   BILITOT 0.3 03/27/2014 0559   GFRNONAA 91 11/03/2018 1021   GFRNONAA >60 03/27/2014 0559   GFRNONAA >89 01/11/2013 1033   GFRAA 105 11/03/2018 1021   GFRAA >60 03/27/2014 0559   GFRAA >89 01/11/2013 1033   Lab Results  Component Value Date   CHOL 199 11/03/2018   HDL 47 11/03/2018   LDLCALC 126 (H) 11/03/2018   TRIG 130 11/03/2018   CHOLHDL 4.2 11/03/2018   Lab Results  Component Value Date   HGBA1C 5.7 (H) 11/03/2018   No results found for: VITAMINB12 Lab Results  Component Value Date   TSH 0.934 11/03/2018       ASSESSMENT AND PLAN  1. OSA (obstructive sleep apnea)   2. Chronic nonintractable headache, unspecified headache type   3. Obstructive sleep apnea syndrome   4. Mild episode of recurrent major depressive disorder ([place])     In summary, Ms. 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  I will start nortriptyline 25 mg nightly in the hope that this will help these chronic headaches.  If not better, consider zonisamide or Topamax that also can sometimes helps these chronic mixed headaches or 1 of the anti-CGRP compounds.  The second problem is that she has pauses in her breathing and other signs and symptoms of obstructive sleep apnea.  It is possible that OSA could be worsening her headaches.  We will check a PSG and consider CPAP or other treatment based on results.  She will return to see me in 3 months or sooner if there are new or worsening neurologic symptoms.  Thank you for asking me to see Ms. Dalia.  Please let me know if I can be of further assistance with her or other patients in the future.    Taha Dimond A. Felecia Shelling, MD, PhD, FAAN Certified in Neurology, Clinical Neurophysiology, Sleep Medicine, Pain Medicine and Neuroimaging Director, [place] at [facility] [address], [place], [place] [address] [phone]

## 2018-12-28 DIAGNOSIS — G4730 Sleep apnea, unspecified: Secondary | ICD-10-CM | POA: Insufficient documentation

## 2019-01-08 ENCOUNTER — Ambulatory Visit (INDEPENDENT_AMBULATORY_CARE_PROVIDER_SITE_OTHER): Payer: BC Managed Care – PPO | Admitting: Family Medicine

## 2019-01-08 ENCOUNTER — Other Ambulatory Visit: Payer: Self-pay

## 2019-01-08 ENCOUNTER — Encounter: Payer: Self-pay | Admitting: Family Medicine

## 2019-01-08 DIAGNOSIS — F330 Major depressive disorder, recurrent, mild: Secondary | ICD-10-CM

## 2019-01-08 DIAGNOSIS — F411 Generalized anxiety disorder: Secondary | ICD-10-CM

## 2019-01-08 MED ORDER — BUPROPION HCL ER (XL) 150 MG PO TB24: 150.0000 mg | ORAL_TABLET | Freq: Every day | ORAL | 1 refills | Status: DC

## 2019-01-08 NOTE — Progress Notes (Signed)
Virtual Visit via Telephone Note  I connected with [place] on 01/08/19 at 10:00 AM EDT by telephone and verified that I am speaking with the correct person using two identifiers.      Pt location: at home   Physician location:  In office, [facility], Vic Blackbird MD     On call: patient and physician   I discussed the limitations, risks, security and privacy concerns of performing an evaluation and management service by telephone and the availability of in person appointments. I also discussed with the patient that there may be a patient responsible charge related to this service. The patient expressed understanding and agreed to proceed.   History of Present Illness:   Telephone visit    MDD- feels much better, was feeling down in the dumps, but wellbutrin has helped a lot, also with energy She is still on lexapro as well, often takes both after lunch and feels well  doesn't feel anxious either      Sleep study on Tuesday - at [facility] sleep [facility] , given pamelor for sleep 25mg  which has helped  No concerns, wants to hold on psychotherapy because she feels better at this time          Observations/Objective: NAD, normal speech on phone   Assessment and Plan:  MDD/ GAD- doing well on medications, has sleep study coming up as well, will see how she does over next month or so with current combination, she is aware to call for any concerns,side effects, or if she feels meds are not adequate  Will f/u her sleep evaluation as well  Hold on psychotherapy referral at this time   Follow Up Instructions:    I discussed the assessment and treatment plan with the patient. The patient was provided an opportunity to ask questions and all were answered. The patient agreed with the plan and demonstrated an understanding of the instructions.   The patient was advised to call back or seek an in-person evaluation if the symptoms worsen or if the condition fails to improve as  anticipated.  I provided 5 minutes of non-face-to-face time during this encounter. End time  10:05am   Vic Blackbird, MD

## 2019-01-12 ENCOUNTER — Ambulatory Visit (INDEPENDENT_AMBULATORY_CARE_PROVIDER_SITE_OTHER): Payer: BC Managed Care – PPO | Admitting: Neurology

## 2019-01-12 ENCOUNTER — Other Ambulatory Visit: Payer: Self-pay | Admitting: Neurology

## 2019-01-12 DIAGNOSIS — G4733 Obstructive sleep apnea (adult) (pediatric): Secondary | ICD-10-CM | POA: Diagnosis not present

## 2019-01-13 ENCOUNTER — Other Ambulatory Visit: Payer: Self-pay

## 2019-01-14 NOTE — Progress Notes (Signed)
PATIENT'S NAME:  Sheila [name], Sheila [name] DOB:      24-Jul-1965      MRN:    [id number]     DATE OF RECORDING: 01/12/2019 REFERRING M.D.:  Vic Blackbird, MD MD/DO/NP/PA Study Performed:   Baseline Polysomnogram HISTORY:  Sheila [name], at [facility] for neurologic consultation regarding her headaches.is a 53 year old woman who has had headaches for many years.      Her husband notes that she snores and will have pauses in breathing at night, sometimes followed by a jerk or snort.   She gets 8 hours of sleep.   A Fitbit reportedly showed she wakes up lot at night but she does not have recall.   She has depression that is doing reasonably well on Wellbutrin plus escitalopram.  The patient endorsed the Epworth Sleepiness Scale at 9/24 points and the FSS at   Points. .    The patient's weight 134 pounds with a height of 63 (inches), resulting in a BMI of 23.8 kg/m2.  The patient's neck circumference measured 15 inches.  CURRENT MEDICATIONS: Wellbutrin XL, Vitamin D3, Premarin, Lexapro, Protonix.   PROCEDURE:  This is a multichannel digital polysomnogram utilizing the Somnostar 11.2 system.  Electrodes and sensors were applied and monitored per AASM Specifications.   EEG, EOG, Chin and Limb EMG, were sampled at 200 Hz.  ECG, Snore and Nasal Pressure, Thermal Airflow, Respiratory Effort, CPAP Flow and Pressure, Oximetry was sampled at 50 Hz. Digital video and audio were recorded.      BASELINE STUDY: Lights Out was at 22:11 and Lights On at 04:58.  Total recording time (TRT) was 407 minutes, with a total sleep time (TST) of 340 minutes.   The patient's sleep latency was 58 minutes.  REM latency was 298 minutes.  The sleep efficiency was 83.5 %.     SLEEP ARCHITECTURE: WASO (Wake after sleep onset) was 8.5 minutes.  There were 2 minutes in Stage N1, 187.5 minutes Stage N2, 110.5 minutes Stage N3 and 40 minutes in Stage REM.  The percentage of Stage N1 was .6%, Stage N2 was 55.1%, Stage N3 was  32.5% and Stage R (REM sleep) was 11.8%.   RESPIRATORY ANALYSIS:  There were a total of 2 respiratory events:  0 obstructive apneas, 0 central apneas and 0 mixed apneas with a total of 0 apneas and an apnea index (AI) of 0 /hour. There were 2 hypopneas with a hypopnea index of .4 /hour. The patient also had 0 respiratory event related arousals (RERAs).      The total APNEA/HYPOPNEA INDEX (AHI) was .4 /hour and the total RESPIRATORY DISTURBANCE INDEX was .4 /hour.  0 events occurred in REM sleep and 4 events in NREM. The REM AHI was 0 /hour, versus a non-REM AHI of 0.4. The patient spent 205.5 minutes of total sleep time in the supine position and 135 minutes in non-supine.. The supine AHI was /h 0.6 versus a non-supine AHI of 0.0/h.  OXYGEN SATURATION & C02:  The Wake baseline 02 saturation was 98%, with the lowest being 92%. Time spent below 89% saturation equaled 0 minutes.  AROUSALS:  The arousals were noted as: 47 were spontaneous, 0 were associated with PLMs, 2 were associated with respiratory events.  The patient had a total of 38 Periodic Limb Movements.  The Periodic Limb Movement (PLM) index was 6.7 and the PLM Arousal index was 0.0/hour.  OTHER Audio and video analysis did not show any abnormal or unusual  movements, complex behaviors, phonations or vocalizations.   There was no nocturia.   Snoring was noted.   EKG in normal sinus rhythm (NSR).   IMPRESSION:  1. There was no sleep apnea 2.  Minimal Periodic Limb Movements of Sleep with no impact on sleep    RECOMMENDATIONS: 1. There was no OSA.  If the snoring is troublesome consider an oral appliance or weight loss. 2. Minimal Periodic Limb Movements of Sleep without impact on sleep continuity.   If this is troublesome, consider medical treatment 3. Follow up wth Dr. Felecia Shelling   I certify that I have reviewed the entire raw data recording prior to the issuance of this report in accordance with the Standards of Accreditation of the  [place] [employer] (AASM)   Richard A. Felecia Shelling, MD, PhD, FAAN Certified in Neurology, Clinical Neurophysiology, Sleep Medicine, Pain Medicine and Neuroimaging Director, [place] at [facility] [address], [place], [place] [address] [phone]     Demographics and Medical History           Name: Sheila [name], Sheila [name]. Age: 41 BMI: 23.8 Interp Physician: Arlice Colt, MD  DOB: [birth year] Ht-IN: 63 CM: 160 Referred By: Vic Blackbird, MD  Pt. Tag:  Wt-LB: 134 KG: 61 Tested By: Fredirick Maudlin, RPSGT  Pt. #: [id number] Sex: Female Scored By: Fredirick Maudlin, RPSGT  Bed Tag: [id number] Race: Caucasian    Sleep Summary    Sleep Time Statistics Minutes Hours    Time in Bed 407    6.8    Total Sleep Time 340    5.7    Total Sleep Time NREM 300    5.0    Total Sleep Time REM 40    0.7    Sleep Onset 58    1.0    Wake After Sleep Onset 8.5    0.1    Wake After Sleep Period 0.5    0.0    Latency Persistent Sleep 58    1.0    Sleep Efficiency 83.5 Percent    Lights out 22:11     Lights on 04:58    Sleep Disruption Events Count Index    Arousals 70 12.4    Awakenings 0 0    Arousals + Awakenings 70 12.4    REM Awakenings 0 0.0     Sleep Stage Statistics Wake N1 N2 N3 REM    Percent Stage to SPT 2.4  0.6  53.8  31.7  11.5  Percent   Sleep Period Time in Stage 8.5 2 187.5 110.5 40 Minutes   Latency to Stage  58 59 25.5 298 Minutes   Percent Stage to TST  .6 55.1 32.5 11.8 Percent   EKG Summary          EKG Statistics         Heart Rate, Wake 80.5 BPM  TST Epochs in HR Interval 0 < 29   Heart Rate, Steady Sleep Avg 76 BPM   0 30-59   PAC Events 0 Count   567 60-79   PVC Events 0 Count   113 80-99   Bradycardia 0 Count   0 100-119   Tachycardia 0 Count   0 120-139        0 140-159    NREM REM   0 > 160   Shortest R-R .6 .7       Longest R-R .9 .8  Respiration  Summary  Event Statistics Total  With Arousal  With Awakening    Count Index  Count Index  Count Index   Apneas, Total 0 0  0 0.0   0 0.0    Hypopneas, Total 2 .4  2 0.4   0 0.0    Apnea + Hypopnea Index 2 0.4   2 0.4   0 0.0    Apneas, Supine 0 0     Apneas, Non Supine 0 0     Hypopneas, Supine 2 .6     Hypopneas, Non Supine 0 0     % Sleep Apnea 0 Percent     % Sleep Hypopnea .1 Percent    Oximetry Statistics       SpO2, Mean Wake 98 Percent     SpO2, Minimum 92 Percent     SpO2, Max 98 Percent     SpO2, Mean 94 Percent            Desaturation Index, REM 0.0  Index     Desaturation Index, NREM 0.4  Index     Desaturation Index, Total 0.4  Index             SpO2 Intervals > 89% 80-89% 70-79% 60-69% 50-59% 40-49% 30-39% < 30%  340 Percent Sleep Time 100 0 0 0 0 0 0 0  Body Position Statistics   Back Side L Side R Side Prone    Total Sleep Time   205.5 134.5 134.5 0 0 Minutes   Percent Time to TST   60.4  39.6  39.6  0.0  0.0  Percent   Number of Events   2 0.0 0 0 0 Count   Number of Apneas   0 0 0 0 0 Count   Number of Hypopneas   2 0 0 0 0 Count   Apnea Index   0.0  0.0  0.0  0.0  0.0  Index   Hypopnea Index   0.6  0.0  0.0  0.0  0.0  Index   Apnea + Hypopnea Index   0.6  0.0  0.0  0.0  0.0  Index  Respiration Events    Non REM, Pre Rx Statistics Non Supine  Supine    Central Mixed Obstr  Central Mixed Obstr   Apneas 0 0 0  0 0 0 Count  Apneas, Minimum SpO2 0 0 0  0 0 0 Percent     Hypopneas 0 0 0  0 0 2 Count  Hypopneas, Minimum SpO2 0 0 0  0 0 92 Percent     Apnea + Hypopneas Index 0.0 0.0 0.0  0.0 0.0 0.7 Index    REM, Pre Rx Statistics Non Supine  Supine    Central Mixed Obstr  Central Mixed Obstr   Apneas 0 0 0  0 0 0 Count  Apneas, Minimum SpO2 0 0 0  0 0 0 Percent     Hypopneas 0 0 0  0 0 0 Count  Hypopneas, Minimum SpO2 0 0 0  0 0 0 Percent     Apnea + Hypopnea Index 0.0 0.0 0.0  0.0 0.0 0.0 Index  Leg Movement Summary     PLM Non REM (Incl. Wake) REM Total    No Arousal Arousal Wake No Arousal Arousal Wake No Arousal Arousal Wake Total   Isolated 11 17 0 6 4 0 17 21 0 38    PLMS 0 0 0 0 0 0 0 0  0 0   Total 11 17 0 6 4 0 17 21 0 38   PLM Statistics PLMS Total     Count Index Count Index    PLM 0 0 38 6.7     PLM with Arousal 0 0 21 3.7     PLM, with Wake 0 0 0 0.0     PLM, Arousal + Wake 0 0.0 21 3.7     PLM, No Arousal 0 0.0  17 3.0     PLM, Non REM 0 0.0  28 5.6     PLM, REM 0 0.0  10 15.0     Technician Comments:  Patient came in for a Baseline PSG sleep study. Patient slept on back and sides. Sleep was observed in all stages. Very light snore was detected. EKG NSR. No nocturia.

## 2019-01-25 ENCOUNTER — Ambulatory Visit
Admission: RE | Admit: 2019-01-25 | Discharge: 2019-01-25 | Disposition: A | Discharge status: Home / Self Care | Payer: BC Managed Care – PPO | Source: Ambulatory Visit | Attending: Obstetrics and Gynecology | Admitting: Obstetrics and Gynecology

## 2019-01-25 ENCOUNTER — Other Ambulatory Visit: Payer: Self-pay

## 2019-01-25 DIAGNOSIS — Z1231 Encounter for screening mammogram for malignant neoplasm of breast: Secondary | ICD-10-CM

## 2019-01-27 ENCOUNTER — Telehealth: Payer: Self-pay | Admitting: *Deleted

## 2019-01-27 NOTE — Telephone Encounter (Signed)
Sent mychart message about results

## 2019-01-28 ENCOUNTER — Other Ambulatory Visit: Payer: Self-pay | Admitting: *Deleted

## 2019-01-28 MED ORDER — ROPINIROLE HCL 0.5 MG PO TABS: 0.5000 mg | ORAL_TABLET | Freq: Every day | ORAL | 5 refills | Status: DC

## 2019-02-12 ENCOUNTER — Ambulatory Visit: Payer: BC Managed Care – PPO | Admitting: Family Medicine

## 2019-03-23 ENCOUNTER — Other Ambulatory Visit: Payer: Self-pay

## 2019-03-23 ENCOUNTER — Encounter: Payer: Self-pay | Admitting: Neurology

## 2019-03-23 ENCOUNTER — Ambulatory Visit: Payer: BC Managed Care – PPO | Admitting: Neurology

## 2019-03-23 VITALS — BP 115/77 | HR 79 | Temp 96.9°F | Ht 63.0 in | Wt 134.0 lb

## 2019-03-23 DIAGNOSIS — R0683 Snoring: Secondary | ICD-10-CM

## 2019-03-23 DIAGNOSIS — G43809 Other migraine, not intractable, without status migrainosus: Secondary | ICD-10-CM | POA: Diagnosis not present

## 2019-03-23 DIAGNOSIS — G4761 Periodic limb movement disorder: Secondary | ICD-10-CM | POA: Insufficient documentation

## 2019-03-23 MED ORDER — INDOMETHACIN 25 MG PO CAPS: 25.0000 mg | ORAL_CAPSULE | Freq: Three times a day (TID) | ORAL | 11 refills | Status: DC | PRN

## 2019-03-23 NOTE — Progress Notes (Signed)
[facility]  PATIENT: Sheila [name] DOB: [birth year]  REFERRING DOCTOR OR PCP: Vic Blackbird MD SOURCE: Patient, notes from primary care, notes from ENT.  _________________________________   HISTORICAL  CHIEF COMPLAINT:  Chief Complaint  Patient presents with  . Follow-up    Rm 12 alone here for 3 month f/u on pt reports she is off the nortriptyline and requip. Pt reports 1 h/a per week~ reports if she does have a h/a it is a bad one     HISTORY OF PRESENT ILLNESS:  Sheila [name] is a 53 y.o. woman with headaches  Update 03/23/2019: She notes that she has different HA patterns.   Sometimes she gets them daily for a couple weeks and other times they just occur more randomly.   She is not think that the nortriptyline.   On a bad month, she has 15-20 days.    Ibuprofen 800 mg helps sometimes.     With a headache she gets a pressure sensation and sometimes has mild nausea.   She denies photophobia and has only mild phonophobia.   She feels better if she lays down and mashes her head.     She had possible sleep apnea (has been noted pauses in the breathing and she has snoring) and borderline excessive daytime sleepiness.  She had a sleep study after the initial consult it did not show any sleep apnea.  There was mild periodic limb movements of sleep but there is no significant impact on sleep  From initial consult 01/12/2019 She is a 53 year old woman who has had headaches for many years.    She is experiencing headaches 15-20 days a month lasting most of the day.   Often she wakes up with a headache.   A typical headache comes on a dull ache in the forehead or occiput.  Pain intensifies as the day goes on.    If headache is severe, she gets nausea but not vomiting.   She denies photophobia but has some phonophobia.   Moving her head does not change the pain much.     If she takes ibuprofen 800 mg the headache sometimes improves but not always.  Imitrex may have  helped some but she felt sick with it.   Sometimes massaging her forehead reduced.  She has never been on a daily prophylactic agent for the headaches.  She used to get frequent sinus infection but these improved after surgery for a deviated septum.      She had teeth pulled do to her jaw being small.     Her husband notes that she snores and will have pauses in breathing at night, sometimes followed by a jerk or snort.   She gets 8 hours of sleep.   A Fitbit reportedly showed she wakes up lot at night but she does not have recall.  She has depression that is doing reasonably well on Wellbutrin plus escitalopram.  EPWORTH SLEEPINESS SCALE  On a scale of 0 - 3 what is the chance of dozing:  Sitting and Reading:   3 Watching TV:    1 Sitting inactive in a public place: 0 Passenger in car for one hour: 0 Lying down to rest in the afternoon: 3 Sitting and talking to someone: 0 Sitting quietly after lunch:  2 In a car, stopped in traffic:  0  Total (out of 24):   9/24   Borderline sleepiness  Notes from primary care and lab work were reviewed  REVIEW OF SYSTEMS: Constitutional: No fevers, chills, sweats, or change in appetite Eyes: No visual changes, double vision, eye pain Ear, nose and throat: No hearing loss, ear pain, nasal congestion, sore throat.  She she has had nasal septum surgery Cardiovascular: No chest pain, palpitations Respiratory: No shortness of breath at rest or with exertion.   No wheezes GastrointestinaI: No nausea, vomiting, diarrhea, abdominal pain, fecal incontinence Genitourinary: No dysuria, urinary retention or frequency.  No nocturia. Musculoskeletal: No neck pain, back pain Integumentary: No rash, pruritus, skin lesions Neurological: as above Psychiatric: No depression at this time.  No anxiety Endocrine: No palpitations, diaphoresis, change in appetite, change in weigh or increased thirst Hematologic/Lymphatic: No anemia, purpura, petechiae.  Allergic/Immunologic: No itchy/runny eyes, nasal congestion, recent allergic reactions, rashes  ALLERGIES: No Known Allergies  HOME MEDICATIONS:  Current Outpatient Medications:  .  buPROPion (WELLBUTRIN XL) 150 MG 24 hr tablet, Take 1 tablet (150 mg total) by mouth daily., Disp: 90 tablet, Rfl: 1 .  cholecalciferol (VITAMIN D3) 25 MCG (1000 UT) tablet, Take 1,000 Units by mouth daily., Disp: , Rfl:  .  conjugated estrogens (PREMARIN) vaginal cream, Use 1/2 g vaginally twice or three times weekly., Disp: 30 g, Rfl: 2 .  escitalopram (LEXAPRO) 20 MG tablet, Take 1 tablet (20 mg total) by mouth daily., Disp: 90 tablet, Rfl: 2 .  pantoprazole (PROTONIX) 40 MG tablet, Take 1 tablet (40 mg total) by mouth daily., Disp: 90 tablet, Rfl: 1 .  indomethacin (INDOCIN) 25 MG capsule, Take 1 capsule (25 mg total) by mouth 3 (three) times daily as needed., Disp: 30 capsule, Rfl: 11  PAST MEDICAL HISTORY: Past Medical History:  Diagnosis Date  . Acne   . Allergy    allergic rhinitis  . Anxiety   . Bladder stone 2018  . Depression   . Elevated hemoglobin A1c 2017   5.9  . GERD (gastroesophageal reflux disease)   . Hyperlipidemia   . Low serum vitamin D 2017   15    PAST SURGICAL HISTORY: Past Surgical History:  Procedure Laterality Date  . ABDOMINAL HYSTERECTOMY  07/11/2009    laparosccopically assisted vaginal hysterectomy with anterior and posterior colporrhaphy, transobturator Monarch sling, cystoscopy/ovaries remain  . APPENDECTOMY    . BLADDER SURGERY  07/2007   cystocele, pessary  . bladder tack     2nd bladder surgery  . CHOLECYSTECTOMY  2015  . COLLAGEN INJECTION  07/07/2008   Periurethral injection - Dr. Matilde Sprang  . CYST REMOVAL HAND    . EXTRACORPOREAL SHOCK WAVE LITHOTRIPSY Right 08/06/2018   Procedure: EXTRACORPOREAL SHOCK WAVE LITHOTRIPSY (ESWL);  Surgeon: Billey Co, MD;  Location: [facility] ORS;  Service: Urology;  Laterality: Right;  . INCONTINENCE SURGERY  08/04/2007    Spark miduethral sling, cystoscopy - Dr. Matilde Sprang  . NASAL SINUS SURGERY  09/2015   Dr.Byers    FAMILY HISTORY: Family History  Problem Relation Age of Onset  . Diabetes Father   . Diabetes Maternal Grandmother   . Uterine cancer Paternal Aunt   . Breast cancer Neg Hx   . Colon cancer Neg Hx     SOCIAL HISTORY:  Social History   Socioeconomic History  . Marital status: Married    Spouse name: Richard "Darren"  . Number of children: Not on file  . Years of education: Not on file  . Highest education level: Not on file  Occupational History    Comment: retired  Scientific laboratory technician  . Financial resource strain: Not on file  .  Food insecurity    Worry: Not on file    Inability: Not on file  . Transportation needs    Medical: Not on file    Non-medical: Not on file  Tobacco Use  . Smoking status: Never Smoker  . Smokeless tobacco: Never Used  Substance and Sexual Activity  . Alcohol use: No    Alcohol/week: 0.0 standard drinks  . Drug use: No  . Sexual activity: Yes    Partners: Male    Birth control/protection: Surgical    Comment: TVH--ovaries remain  Lifestyle  . Physical activity    Days per week: Not on file    Minutes per session: Not on file  . Stress: Not on file  Relationships  . Social Herbalist on phone: Not on file    Gets together: Not on file    Attends religious service: Not on file    Active member of club or organization: Not on file    Attends meetings of clubs or organizations: Not on file    Relationship status: Not on file  . Intimate partner violence    Fear of current or ex partner: Not on file    Emotionally abused: Not on file    Physically abused: Not on file    Forced sexual activity: Not on file  Other Topics Concern  . Not on file  Social History Narrative   Lives with husband   Caffeine use: 24-32 oz per day    [place]   2 kids   Married 1989     PHYSICAL EXAM  Vitals:   03/23/19  1450  BP: 115/77  Pulse: 79  Temp: (!) 96.9 F (36.1 C)  TempSrc: Temporal  Weight: 134 lb (60.8 kg)  Height: 5\' 3"  (1.6 m)    Body mass index is 23.74 kg/m.   General: The patient is well-developed and well-nourished and in no acute distress  HEENT:  Head is [place]/AT.  Sclera are anicteric.    Skin: Extremities are without rash or  edema.  Neurologic Exam  Mental status: The patient is alert and oriented x 3 at the time of the examination. The patient has apparent normal recent and remote memory, with an apparently normal attention span and concentration ability.   Speech is normal.  Cranial nerves: Extraocular movements are full.  There is good facial sensation to soft touch bilaterally.Facial strength is normal.  Trapezius and sternocleidomastoid strength is normal. No dysarthria is noted.  No obvious hearing deficits are noted.  Motor:  Muscle bulk is normal.   Tone is normal. Strength is  5 / 5 in all 4 extremities.   Sensory: Intact sensation to touch and vibration sensation.  Coordination: Cerebellar testing reveals good finger-nose-finger and heel-to-shin bilaterally.  Gait and station: Station is normal.   Gait is normal. Tandem gait is normal. Romberg is negative.   Reflexes: Deep tendon reflexes are symmetric and 3+ at the knees and 2 elsewhere and symmetric .   Plantar responses are flexor.    DIAGNOSTIC DATA (LABS, IMAGING, TESTING) - I reviewed patient records, labs, notes, testing and imaging myself where available.  Lab Results  Component Value Date   WBC 5.8 11/03/2018   HGB 12.9 11/03/2018   HCT 38.7 11/03/2018   MCV 90 11/03/2018   PLT 241 11/03/2018      Component Value Date/Time   NA 144 11/03/2018 1021   NA 142 03/27/2014 0559   K 4.3 11/03/2018  1021   K 3.7 03/27/2014 0559   CL 106 11/03/2018 1021   CL 108 (H) 03/27/2014 0559   CO2 22 11/03/2018 1021   CO2 25 03/27/2014 0559   GLUCOSE 101 (H) 11/03/2018 1021   GLUCOSE 146 (H) 08/05/2018  1323   GLUCOSE 117 (H) 03/27/2014 0559   BUN 16 11/03/2018 1021   BUN 12 03/27/2014 0559   CREATININE 0.75 11/03/2018 1021   CREATININE 0.71 08/26/2016 0853   CALCIUM 9.7 11/03/2018 1021   CALCIUM 9.5 03/27/2014 0559   PROT 6.5 11/03/2018 1021   PROT 7.4 03/27/2014 0559   ALBUMIN 4.4 11/03/2018 1021   ALBUMIN 3.9 03/27/2014 0559   AST 22 11/03/2018 1021   AST 27 03/27/2014 0559   ALT 14 11/03/2018 1021   ALT 22 03/27/2014 0559   ALKPHOS 89 11/03/2018 1021   ALKPHOS 82 03/27/2014 0559   BILITOT 0.3 11/03/2018 1021   BILITOT 0.3 03/27/2014 0559   GFRNONAA 91 11/03/2018 1021   GFRNONAA >60 03/27/2014 0559   GFRNONAA >89 01/11/2013 1033   GFRAA 105 11/03/2018 1021   GFRAA >60 03/27/2014 0559   GFRAA >89 01/11/2013 1033   Lab Results  Component Value Date   CHOL 199 11/03/2018   HDL 47 11/03/2018   LDLCALC 126 (H) 11/03/2018   TRIG 130 11/03/2018   CHOLHDL 4.2 11/03/2018   Lab Results  Component Value Date   HGBA1C 5.7 (H) 11/03/2018   No results found for: [id number] Lab Results  Component Value Date   TSH 0.934 11/03/2018       ASSESSMENT AND PLAN  1. Other migraine without status migrainosus, not intractable   2. Snoring   3. Periodic limb movements of sleep     1.   She would prefer to stay off of a prophylactic agent for the migraines at this time.  I wrote a prescription for indomethacin and she can take 1 pill as needed, up to 30 a month. 2.   If headaches worsen consider other prophylactic agents such as Keppra or zonisamide. 3.   She does not have sleep apnea.  If the snoring is troublesome she could use an oral appliance.  The periodic limb movements of sleep were not severe enough to treat. 4.   She will return to see [place] as needed.  Richard A. Felecia Shelling, MD, PhD, FAAN Certified in Neurology, Clinical Neurophysiology, Sleep Medicine, Pain Medicine and Neuroimaging Director, [place] [facility] [address] [place], [place] [address] [phone]

## 2019-03-26 ENCOUNTER — Encounter: Payer: Self-pay | Admitting: Family Medicine

## 2019-04-23 ENCOUNTER — Ambulatory Visit: Payer: BC Managed Care – PPO | Admitting: Family Medicine

## 2019-04-23 ENCOUNTER — Encounter: Payer: Self-pay | Admitting: Family Medicine

## 2019-04-23 ENCOUNTER — Other Ambulatory Visit: Payer: Self-pay

## 2019-04-23 VITALS — BP 112/64 | HR 74 | Temp 98.7°F | Resp 16 | Ht 63.0 in | Wt 130.0 lb

## 2019-04-23 DIAGNOSIS — K120 Recurrent oral aphthae: Secondary | ICD-10-CM

## 2019-04-23 DIAGNOSIS — N390 Urinary tract infection, site not specified: Secondary | ICD-10-CM | POA: Diagnosis not present

## 2019-04-23 DIAGNOSIS — J209 Acute bronchitis, unspecified: Secondary | ICD-10-CM

## 2019-04-23 DIAGNOSIS — Z20822 Contact with and (suspected) exposure to covid-19: Secondary | ICD-10-CM

## 2019-04-23 DIAGNOSIS — Z20828 Contact with and (suspected) exposure to other viral communicable diseases: Secondary | ICD-10-CM

## 2019-04-23 LAB — URINALYSIS, ROUTINE W REFLEX MICROSCOPIC
Bilirubin Urine: NEGATIVE
Glucose, UA: NEGATIVE
Hgb urine dipstick: NEGATIVE
Ketones, ur: NEGATIVE
Leukocytes,Ua: NEGATIVE
Nitrite: NEGATIVE
Protein, ur: NEGATIVE
Specific Gravity, Urine: 1.025 (ref 1.001–1.03)
pH: 5.5 (ref 5.0–8.0)

## 2019-04-23 MED ORDER — TRIAMCINOLONE ACETONIDE 0.1 % MT PSTE: 1.0000 "application " | PASTE | Freq: Two times a day (BID) | OROMUCOSAL | 12 refills | Status: DC

## 2019-04-23 MED ORDER — HYDROCOD POLST-CPM POLST ER 10-8 MG/5ML PO SUER: 5.0000 mL | Freq: Two times a day (BID) | ORAL | 0 refills | Status: DC | PRN

## 2019-04-23 MED ORDER — PREDNISONE 20 MG PO TABS: 20.0000 mg | ORAL_TABLET | Freq: Every day | ORAL | 0 refills | Status: DC

## 2019-04-23 MED ORDER — SULFAMETHOXAZOLE-TRIMETHOPRIM 800-160 MG PO TABS: 1.0000 | ORAL_TABLET | Freq: Two times a day (BID) | ORAL | 0 refills | Status: DC

## 2019-04-23 NOTE — Patient Instructions (Signed)
Use cough medicine and prednisone  Take bactrim if you get recurrent symptoms  [place] COVID to [id number] or [email or web address]   Or call [phone]

## 2019-04-23 NOTE — Progress Notes (Signed)
Subjective:    Patient ID: Sheila [name], female    DOB: November 09, 1965, 53 y.o.   MRN: [id number]  Patient presents for Dysuria (frequent UTI's- would like los dose ABTx for daily prevention) and Illness (x3 weeks- cough, hoarse, HA, neg COIVD test per UC)   Seen at UC on 12/11 E coli UTI, prescribed Bactrim , she completed antibiotics on Monday   she had had UTI in the sat 3 months  She has pessary   She does have bladder spasm  Yesterday twinges of pain and she took a home test which told her she had trace leukocytes so she want to see if her UTI had cleared.  She also asked if she could be placed on a daily low-dose antibiotic to prevent them. She has appt in Jan with UroGynecologist , she has been using Ellura which helped   Cough for past 3 weeks, she used mucinex, often swallowing the mucous. She has been taking DM as well for cough with minimal improvement . 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  She has a sore on the side of her tongue is been parous in the past few days.  Painful with eating  Review Of Systems:  GEN- denies fatigue, fever, weight loss,weakness, recent illness HEENT- denies eye drainage, change in vision, nasal discharge, CVS- denies chest pain, palpitations RESP- denies SOB,+ cough, wheeze ABD- denies N/V, change in stools, abd pain GU- + dysuria, denies hematuria, dribbling, incontinence MSK- denies joint pain, muscle aches, injury Neuro- denies headache, dizziness, syncope, seizure activity       Objective:    BP 112/64   Pulse 74   Temp 98.7 F (37.1 C) (Temporal)   Resp 16   Ht 5\' 3"  (1.6 m)   Wt 130 lb (59 kg)   LMP 05/06/2009   SpO2 93%   BMI 23.03 kg/m  GEN- NAD, alert and  oriented x3 HEENT- PERRL, EOMI, non injected sclera, pink conjunctiva, MMM, posterior oropharynx clear, gray base ulceration on the right lateral side of tongue, nares clear no sinus tenderness Neck- Supple, no no lymphadenopathy CVS- RRR, no murmur RESP-CTAB ABD-NABS,soft,NT,ND, no CVA tenderness EXT- No edema Pulses- Radial 2+        Assessment & Plan:      Problem List Items Addressed This Visit    None    Visit Diagnoses    Recurrent UTI    -  Primary   I have given her a refill on the Bactrim to have on hand in case she has recurrent full-blown symptoms but urinalysis looks okay today.  I would recommend she follow-up with her urogynecologist in January to discuss daily options for suppression.   Relevant Medications   sulfamethoxazole-trimethoprim (BACTRIM DS) 800-160 MG tablet   Other Relevant Orders   Urinalysis, Routine w reflex microscopic (Completed)   Urine Culture   Acute bronchitis, unspecified organism       Acute bronchitis however another recent exposure to Covid recommend she go get tested again.  Prednisone and Tussionex given tussionex given     Relevant Orders   Novel Coronavirus, NAA ([facility])   Canker sore  Kenolog orabase to tongue   Exposure to COVID-19 virus       Relevant Orders   Novel Coronavirus, NAA ([facility])      Note: This dictation was prepared with Dragon dictation along with smaller phrase technology. Any transcriptional errors that result from this process are unintentional.

## 2019-04-24 LAB — URINE CULTURE
MICRO NUMBER:: 1212708
SPECIMEN QUALITY:: ADEQUATE

## 2019-04-27 ENCOUNTER — Other Ambulatory Visit: Payer: BC Managed Care – PPO

## 2019-05-06 ENCOUNTER — Other Ambulatory Visit: Payer: BC Managed Care – PPO

## 2019-05-06 ENCOUNTER — Ambulatory Visit: Payer: BC Managed Care – PPO | Attending: Internal Medicine

## 2019-05-06 DIAGNOSIS — Z20822 Contact with and (suspected) exposure to covid-19: Secondary | ICD-10-CM

## 2019-05-12 ENCOUNTER — Encounter: Payer: Self-pay | Admitting: Family Medicine

## 2019-05-12 ENCOUNTER — Ambulatory Visit (INDEPENDENT_AMBULATORY_CARE_PROVIDER_SITE_OTHER): Payer: BC Managed Care – PPO | Admitting: Family Medicine

## 2019-05-12 ENCOUNTER — Other Ambulatory Visit: Payer: Self-pay

## 2019-05-12 DIAGNOSIS — J209 Acute bronchitis, unspecified: Secondary | ICD-10-CM | POA: Diagnosis not present

## 2019-05-12 LAB — NOVEL CORONAVIRUS, NAA

## 2019-05-12 NOTE — Progress Notes (Signed)
Virtual Visit via Telephone Note  I connected with Sheila [name] on 05/12/19 at  2:00 PM EST by telephone and verified that I am speaking with the correct person using two identifiers.     Pt location: at home   Physician location:  In office, [facility], Vic Blackbird MD     On call: patient and physician   I discussed the limitations, risks, security and privacy concerns of performing an evaluation and management service by telephone and the availability of in person appointments. I also discussed with the patient that there may be a patient responsible charge related to this service. The patient expressed understanding and agreed to proceed.   History of Present Illness:     Pt tried to get tested on 12/31 but the test "expired" so she does not have a result Her husband was around a group of friends who tested positive for covid  and he did have some symptoms but now improved.   She still has congestion mostly nasal, has mild productive cough but overall cough has improved significantly.  She is not short of breath.,  She did use up the tussionex cough syrup, this helped a little, mucinex D and OTC med which helped more  She had a little wheezing but that has also improved   She did have a little diarrhea a few days after being constipated, now back at her baseline.  Has not had any fever body aches.   Observations/Objective: NAD noted on the phone  Assessment and Plan: Acute bronchitis- she is now day 12 from her possible exposure from her husband.  At this point I would not retest.  She is already improving in her symptoms.  With regard to the possible scare that she had at her job that person's testing also came back negative.  Recommend she continue with her Mucinex D as this has been helping.  Continue to stay hydrated.  She will remain out of the office until Monday at that point she is over 2 weeks on quarantine.  I did advise that she feels like she is  getting a sinus infection after all of this settles down she can take the Bactrim prescription that she has at home I had given her for recurrent UTI and she voiced understanding.     Follow Up Instructions:    I discussed the assessment and treatment plan with the patient. The patient was provided an opportunity to ask questions and all were answered. The patient agreed with the plan and demonstrated an understanding of the instructions.   The patient was advised to call back or seek an in-person evaluation if the symptoms worsen or if the condition fails to improve as anticipated.  I provided  13 minutes of non-face-to-face time during this encounter. End Time: 2:13pm  Vic Blackbird, MD

## 2019-06-02 ENCOUNTER — Ambulatory Visit: Payer: BC Managed Care – PPO | Attending: Internal Medicine

## 2019-06-02 DIAGNOSIS — Z20822 Contact with and (suspected) exposure to covid-19: Secondary | ICD-10-CM

## 2019-06-03 LAB — NOVEL CORONAVIRUS, NAA: SARS-CoV-2, NAA: NOT DETECTED

## 2019-06-20 ENCOUNTER — Other Ambulatory Visit: Payer: Self-pay | Admitting: Family Medicine

## 2019-07-02 ENCOUNTER — Other Ambulatory Visit: Payer: Self-pay | Admitting: Family Medicine

## 2019-07-03 ENCOUNTER — Ambulatory Visit: Payer: BC Managed Care – PPO | Attending: Internal Medicine

## 2019-07-03 DIAGNOSIS — Z23 Encounter for immunization: Secondary | ICD-10-CM

## 2019-07-03 NOTE — Progress Notes (Signed)
   Covid-19 Vaccination Clinic  Name:  Sheila [name]    MRN: [id number] DOB: 11-25-1965  07/03/2019  [name] was observed post Covid-19 immunization for 15 minutes without incidence. She was provided with Vaccine Information Sheet and instruction to access the V-Safe system.   [name] was instructed to call 911 with any severe reactions post vaccine: Marland Kitchen Difficulty breathing  . Swelling of your face and throat  . A fast heartbeat  . A bad rash all over your body  . Dizziness and weakness    Immunizations Administered    Name Date Dose VIS Date Route   Pfizer COVID-19 Vaccine 07/03/2019  3:51 PM 0.3 mL 04/16/2019 Intramuscular   Manufacturer: [place]   Lot: [id number]   [place]: [id number]

## 2019-07-24 ENCOUNTER — Ambulatory Visit: Payer: BC Managed Care – PPO | Attending: Internal Medicine

## 2019-07-24 DIAGNOSIS — Z23 Encounter for immunization: Secondary | ICD-10-CM

## 2019-07-24 NOTE — Progress Notes (Signed)
   Covid-19 Vaccination Clinic  Name:  Sheila [name]    MRN: [id number] DOB: [birth year]  07/24/2019  [name] was observed post Covid-19 immunization for 15 minutes without incident. She was provided with Vaccine Information Sheet and instruction to access the V-Safe system.   [name] was instructed to call 911 with any severe reactions post vaccine: Marland Kitchen Difficulty breathing  . Swelling of face and throat  . A fast heartbeat  . A bad rash all over body  . Dizziness and weakness   Immunizations Administered    Name Date Dose VIS Date Route   Pfizer COVID-19 Vaccine 07/24/2019 12:55 PM 0.3 mL 04/16/2019 Intramuscular   Manufacturer: [place]   Lot: [id number]   [place]: [id number]

## 2019-08-05 ENCOUNTER — Other Ambulatory Visit: Payer: Self-pay | Admitting: Obstetrics and Gynecology

## 2019-08-05 NOTE — Telephone Encounter (Signed)
Medication refill request: Premarin Last AEX:  11/03/18 BS Next AEX: not scheduled  Last MMG (if hormonal medication request): 01/25/19 BIRADS 1 negative Refill authorized: Please advise  Tried calling patient to schedule AEX. No answer, left message for patient to call me back.

## 2019-08-13 ENCOUNTER — Encounter: Payer: Self-pay | Admitting: Family Medicine

## 2019-08-13 MED ORDER — IBUPROFEN 800 MG PO TABS: 800.0000 mg | ORAL_TABLET | Freq: Three times a day (TID) | ORAL | 1 refills | Status: DC | PRN

## 2019-08-20 ENCOUNTER — Telehealth: Payer: Self-pay | Admitting: Obstetrics and Gynecology

## 2019-08-20 NOTE — Telephone Encounter (Signed)
Received PA auth request via fax from [facility].  PA submitted on 4/15 through Covermymeds/[employer]  Denied on April 15 Your PA request has been denied.  Plan will cover alt meds:  Estradiol cream 0.01 %  Vagifem 10 mcg vaginal tab   Spoke with pt. Pt states having only 1/2 tube of Premarin left. Pt states has used estradiol cream in past from compound pharmacy, but doesn't receive enough in syringe and is far to drive from home to [place].   Pt also states paying out of pocket with manufacturer savings care for Premarin that is allowed twice a year. Pt ok to use alt meds. Will discuss with provider recommendations and return call to pt. Pt agreeable.    Pt has pessary due prolapse. Last AEX 11/03/18 with Dr Quincy Simmonds.  Hx of rectocele, vaginal atrophy.   Routing to Dr Talbert Nan for recommendations-covering provider for Dr Quincy Simmonds.

## 2019-08-20 NOTE — Telephone Encounter (Signed)
Patient states she is returning a call to a nurse from yesterday regarding needing a prior authorization on her premarin. No open telephone note?

## 2019-08-21 NOTE — Telephone Encounter (Signed)
Since the patient has 1/2 tube of premarin left and she has a pessary, I will route this message to Dr Quincy Simmonds. Dr Quincy Simmonds will be back next week.  I'm not sure if the patient was supposed to be seen for a pessary check. It also appears that she is due to see Dr Quincy Simmonds in 6/21 for an annual exam. I/2 a tube of premarin should last over 3 months. CC: Dr Quincy Simmonds

## 2019-08-23 ENCOUNTER — Ambulatory Visit: Payer: BC Managed Care – PPO | Admitting: Urology

## 2019-08-23 NOTE — Telephone Encounter (Signed)
Left message for pt to return call to triage RN. 

## 2019-08-25 ENCOUNTER — Other Ambulatory Visit: Payer: Self-pay

## 2019-08-25 ENCOUNTER — Ambulatory Visit
Admission: RE | Admit: 2019-08-25 | Discharge: 2019-08-25 | Disposition: A | Discharge status: Home / Self Care | Payer: BC Managed Care – PPO | Source: Ambulatory Visit | Attending: Urology | Admitting: Urology

## 2019-08-25 ENCOUNTER — Encounter: Payer: Self-pay | Admitting: Urology

## 2019-08-25 ENCOUNTER — Ambulatory Visit (INDEPENDENT_AMBULATORY_CARE_PROVIDER_SITE_OTHER): Payer: BC Managed Care – PPO | Admitting: Urology

## 2019-08-25 VITALS — BP 99/65 | HR 80 | Ht 63.0 in | Wt 133.5 lb

## 2019-08-25 DIAGNOSIS — N390 Urinary tract infection, site not specified: Secondary | ICD-10-CM

## 2019-08-25 DIAGNOSIS — N200 Calculus of kidney: Secondary | ICD-10-CM

## 2019-08-25 NOTE — Telephone Encounter (Signed)
Left message for pt to return call to triage RN. 

## 2019-08-25 NOTE — Telephone Encounter (Signed)
Ok if patient wants to use estradiol cream 0.01%.  Sig:  place 1/2 gram per vagina at hs twice weekly.  Disp:  42.5 gram. RF:  none.

## 2019-08-25 NOTE — Progress Notes (Signed)
   08/25/2019 12:58 PM   [name] March 19, 1966 [id number]  Reason for visit: Follow-up nephrolithiasis  HPI: I saw Sheila [name] back in urology clinic for follow-up of nephrolithiasis.  She is a 54 year old female who underwent successful shockwave lithotripsy in April 2020 when she presented with a 6 mm right mid ureteral stone and intractable pain nausea.  Stone analysis showed calcium oxalate.  She had a few small punctate right-sided renal stones on that original CT.  She has no prior stone events.  She denies any problems over the last year including flank pain, dysuria, or gross hematuria.  She follows with a urogynecologist Sheila [name] at [facility] for recurrent UTIs and pelvic floor dysfunction.  We discussed general stone prevention strategies including adequate hydration with goal of producing 2.5 L of urine daily, increasing citric acid intake, increasing calcium intake during high oxalate meals, minimizing animal protein, and decreasing salt intake. Information about dietary recommendations given today.   KUB today to evaluate stone burden, call with results  I spent 20 total minutes on the day of the encounter including pre-visit review of the medical record, face-to-face time with the patient, and post visit ordering of labs/imaging/tests.  [place], [facility] [address], [place], [place] [address] [phone]

## 2019-08-25 NOTE — Patient Instructions (Signed)
Dietary Guidelines to Help Prevent Kidney Stones Kidney stones are deposits of minerals and salts that form inside your kidneys. Your risk of developing kidney stones may be greater depending on your diet, your lifestyle, the medicines you take, and whether you have certain medical conditions. Most people can reduce their chances of developing kidney stones by following the instructions below. Depending on your overall health and the type of kidney stones you tend to develop, your dietitian may give you more specific instructions. What are tips for following this plan? Reading food labels  Choose foods with "no salt added" or "low-salt" labels. Limit your sodium intake to less than 1500 mg per day.  Choose foods with calcium for each meal and snack. Try to eat about 300 mg of calcium at each meal. Foods that contain 200-500 mg of calcium per serving include: ? 8 oz (237 ml) of milk, fortified nondairy milk, and fortified fruit juice. ? 8 oz (237 ml) of kefir, yogurt, and soy yogurt. ? 4 oz (118 ml) of tofu. ? 1 oz of cheese. ? 1 cup (300 g) of dried figs. ? 1 cup (91 g) of cooked broccoli. ? 1-3 oz can of sardines or mackerel.  Most people need 1000 to 1500 mg of calcium each day. Talk to your dietitian about how much calcium is recommended for you. Shopping  Buy plenty of fresh fruits and vegetables. Most people do not need to avoid fruits and vegetables, even if they contain nutrients that may contribute to kidney stones.  When shopping for convenience foods, choose: ? Whole pieces of fruit. ? Premade salads with dressing on the side. ? Low-fat fruit and yogurt smoothies.  Avoid buying frozen meals or prepared deli foods.  Look for foods with live cultures, such as yogurt and kefir. Cooking  Do not add salt to food when cooking. Place a salt shaker on the table and allow each person to add his or her own salt to taste.  Use vegetable protein, such as beans, textured vegetable  protein (TVP), or tofu instead of meat in pasta, casseroles, and soups. Meal planning   Eat less salt, if told by your dietitian. To do this: ? Avoid eating processed or premade food. ? Avoid eating fast food.  Eat less animal protein, including cheese, meat, poultry, or fish, if told by your dietitian. To do this: ? Limit the number of times you have meat, poultry, fish, or cheese each week. Eat a diet free of meat at least 2 days a week. ? Eat only one serving each day of meat, poultry, fish, or seafood. ? When you prepare animal protein, cut pieces into small portion sizes. For most meat and fish, one serving is about the size of one deck of cards.  Eat at least 5 servings of fresh fruits and vegetables each day. To do this: ? Keep fruits and vegetables on hand for snacks. ? Eat 1 piece of fruit or a handful of berries with breakfast. ? Have a salad and fruit at lunch. ? Have two kinds of vegetables at dinner.  Limit foods that are high in a substance called oxalate. These include: ? Spinach. ? Rhubarb. ? Beets. ? Potato chips and french fries. ? Nuts.  If you regularly take a diuretic medicine, make sure to eat at least 1-2 fruits or vegetables high in potassium each day. These include: ? Avocado. ? Banana. ? Orange, prune, carrot, or tomato juice. ? Baked potato. ? Cabbage. ? Beans and split   peas. General instructions   Drink enough fluid to keep your urine clear or pale yellow. This is the most important thing you can do.  Talk to your health care provider and dietitian about taking daily supplements. Depending on your health and the cause of your kidney stones, you may be advised: ? Not to take supplements with vitamin C. ? To take a calcium supplement. ? To take a daily probiotic supplement. ? To take other supplements such as magnesium, fish oil, or vitamin B6.  Take all medicines and supplements as told by your health care provider.  Limit alcohol intake to no  more than 1 drink a day for nonpregnant women and 2 drinks a day for men. One drink equals 12 oz of beer, 5 oz of wine, or 1 oz of hard liquor.  Lose weight if told by your health care provider. Work with your dietitian to find strategies and an eating plan that works best for you. What foods are not recommended? Limit your intake of the following foods, or as told by your dietitian. Talk to your dietitian about specific foods you should avoid based on the type of kidney stones and your overall health. Grains Breads. Bagels. Rolls. Baked goods. Salted crackers. Cereal. Pasta. Vegetables Spinach. Rhubarb. Beets. Canned vegetables. Pickles. Olives. Meats and other protein foods Nuts. Nut butters. Large portions of meat, poultry, or fish. Salted or cured meats. Deli meats. Hot dogs. Sausages. Dairy Cheese. Beverages Regular soft drinks. Regular vegetable juice. Seasonings and other foods Seasoning blends with salt. Salad dressings. Canned soups. Soy sauce. Ketchup. Barbecue sauce. Canned pasta sauce. Casseroles. Pizza. Lasagna. Frozen meals. Potato chips. French fries. Summary  You can reduce your risk of kidney stones by making changes to your diet.  The most important thing you can do is drink enough fluid. You should drink enough fluid to keep your urine clear or pale yellow.  Ask your health care provider or dietitian how much protein from animal sources you should eat each day, and also how much salt and calcium you should have each day. This information is not intended to replace advice given to you by your health care provider. Make sure you discuss any questions you have with your health care provider. Document Revised: 08/12/2018 Document Reviewed: 04/02/2016 Elsevier Patient Education  2020 [employer]  

## 2019-08-25 NOTE — Telephone Encounter (Signed)
Spoke with pt. Pt given recommendations per Dr Quincy Simmonds. Pt agreeable to try estradiol vaginal cream so it will be covered through insurance.  Pt wanting to have new Rx written at [facility] due to having enough in tube of Premarin cream to last til appt.   Routing to Dr Quincy Simmonds for review.  Encounter closed.  Rx for estradiol cream not sent.

## 2019-08-26 ENCOUNTER — Other Ambulatory Visit: Payer: Self-pay | Admitting: Family Medicine

## 2019-08-27 ENCOUNTER — Other Ambulatory Visit: Payer: Self-pay

## 2019-08-27 ENCOUNTER — Ambulatory Visit (INDEPENDENT_AMBULATORY_CARE_PROVIDER_SITE_OTHER): Payer: BC Managed Care – PPO | Admitting: Family Medicine

## 2019-08-27 ENCOUNTER — Encounter: Payer: Self-pay | Admitting: Family Medicine

## 2019-08-27 ENCOUNTER — Telehealth: Payer: Self-pay

## 2019-08-27 VITALS — BP 104/60 | HR 80 | Temp 98.0°F | Resp 14 | Ht 63.0 in | Wt 133.0 lb

## 2019-08-27 DIAGNOSIS — I959 Hypotension, unspecified: Secondary | ICD-10-CM

## 2019-08-27 DIAGNOSIS — R7303 Prediabetes: Secondary | ICD-10-CM

## 2019-08-27 DIAGNOSIS — G43809 Other migraine, not intractable, without status migrainosus: Secondary | ICD-10-CM

## 2019-08-27 DIAGNOSIS — E559 Vitamin D deficiency, unspecified: Secondary | ICD-10-CM

## 2019-08-27 DIAGNOSIS — R5383 Other fatigue: Secondary | ICD-10-CM

## 2019-08-27 NOTE — Patient Instructions (Signed)
F/U as needed We will call with results

## 2019-08-27 NOTE — Telephone Encounter (Signed)
-----   Message from Billey Co, MD sent at 08/26/2019 12:56 PM EDT ----- Minimal stones on KUB, one very tiny stone in right kidney, recommend follow up as needed  Thanks Nickolas Madrid, MD 08/26/2019

## 2019-08-27 NOTE — Assessment & Plan Note (Signed)
Recommend she continue with ibuprofen as needed.  She can always follow-up with her neurologist.  There is also mention of possible sleep apnea but her sleep study was normal.  She does snore it was recommended that she try dental device as the next step this may actually help with some of her fatigue as well.  We will check thyroid CBC metabolic vitamin D for her fatigue.  EKG was unremarkable with regards to fatigue and hypotensive.  Commend she increase her water to 64 ounces a day.  Today her blood pressure was at her baseline

## 2019-08-27 NOTE — Telephone Encounter (Signed)
Pt returns call, informed her of the information below. Pt gave verbal understanding.

## 2019-08-27 NOTE — Telephone Encounter (Signed)
Called pt no answer. Line picks up and then drops. 1st attempt.

## 2019-08-27 NOTE — Progress Notes (Signed)
Subjective:    Patient ID: Sheila [name], female    DOB: 01-04-1966, 54 y.o.   MRN: [id number]  Patient presents for Hypotension (was seen at urology and noted BP low (85/55)- has been having extreme fatigue)    Pt here to f/u blood pressure. Her BP was very low 85/55. She has had fatigue and headaches just have migraines headaches.  She was seen by neurology for her migraines given indomethacin this did not help so she has been taking ibuprofen as needed.  Is been no change in her headache associated with the blood pressure.  Her typical blood pressure is anywhere from upper 90s to 115 over 50s to 70s.  This been this way for years.   She has borderline DM- last A1C was  5.7% June 2020, but she does check her fasting blood sugar typically about 110   She is vitamin  D  2000IU daily has been no change in her fatigue with the supplementation.  Note she did not have any bladder infection or other infection of the time her blood pressure was low   Her appetite is good.  Her bowel movements are normal   Review Of Systems:  GEN- denies fatigue, fever, weight loss,weakness, recent illness HEENT- denies eye drainage, change in vision, nasal discharge, CVS- denies chest pain, palpitations RESP- denies SOB, cough, wheeze ABD- denies N/V, change in stools, abd pain GU- denies dysuria, hematuria, dribbling, incontinence MSK- denies joint pain, muscle aches, injury Neuro- denies headache, dizziness, syncope, seizure activity       Objective:    BP 104/60   Pulse 80   Temp 98 F (36.7 C) (Temporal)   Resp 14   Ht 5\' 3"  (1.6 m)   Wt 133 lb (60.3 kg)   LMP 05/06/2009   SpO2 98%   BMI 23.56 kg/m  GEN- NAD, alert and oriented x3 HEENT- PERRL, EOMI, non injected sclera, pink conjunctiva, MMM, oropharynx clear Neck- Supple, no thyromegaly CVS- RRR, no murmur RESP-CTAB ABD-NABS,soft,NT,ND EXT- No edema Pulses- Radial, DP- 2+  EKG -normal sinus rhythm      Assessment & Plan:       Problem List Items Addressed This Visit      Unprioritized   Borderline diabetes   Relevant Orders   Hemoglobin A1c   Migraine headache    Recommend she continue with ibuprofen as needed.  She can always follow-up with her neurologist.  There is also mention of possible sleep apnea but her sleep study was normal.  She does snore it was recommended that she try dental device as the next step this may actually help with some of her fatigue as well.  We will check thyroid CBC metabolic vitamin D for her fatigue.  EKG was unremarkable with regards to fatigue and hypotensive.  Commend she increase her water to 64 ounces a day.  Today her blood pressure was at her baseline         Other Visit Diagnoses    Hypotension, unspecified hypotension type    -  Primary   Relevant Orders   CBC with Differential/Platelet   Comprehensive metabolic panel   TSH   EKG 12-Lead (Completed)   Iron, TIBC and Ferritin Panel   Vitamin D deficiency       Relevant Orders   Vitamin D, 25-hydroxy   Fatigue, unspecified type       Relevant Orders   CBC with Differential/Platelet   Comprehensive metabolic panel   TSH  Iron, TIBC and Ferritin Panel      Note: This dictation was prepared with Dragon dictation along with smaller phrase technology. Any transcriptional errors that result from this process are unintentional.

## 2019-08-28 LAB — CBC WITH DIFFERENTIAL/PLATELET
Absolute Monocytes: 546 cells/uL (ref 200–950)
Basophils Absolute: 42 cells/uL (ref 0–200)
Basophils Relative: 0.8 %
Eosinophils Absolute: 101 cells/uL (ref 15–500)
Eosinophils Relative: 1.9 %
HCT: 41.5 % (ref 35.0–45.0)
Hemoglobin: 13.5 g/dL (ref 11.7–15.5)
Lymphs Abs: 1738 cells/uL (ref 850–3900)
MCH: 29.6 pg (ref 27.0–33.0)
MCHC: 32.5 g/dL (ref 32.0–36.0)
MCV: 91 fL (ref 80.0–100.0)
MPV: 10.2 fL (ref 7.5–12.5)
Monocytes Relative: 10.3 %
Neutro Abs: 2873 cells/uL (ref 1500–7800)
Neutrophils Relative %: 54.2 %
Platelets: 286 10*3/uL (ref 140–400)
RBC: 4.56 10*6/uL (ref 3.80–5.10)
RDW: 12.4 % (ref 11.0–15.0)
Total Lymphocyte: 32.8 %
WBC: 5.3 10*3/uL (ref 3.8–10.8)

## 2019-08-28 LAB — COMPREHENSIVE METABOLIC PANEL
AG Ratio: 2.1 (calc) (ref 1.0–2.5)
ALT: 15 U/L (ref 6–29)
AST: 21 U/L (ref 10–35)
Albumin: 4.5 g/dL (ref 3.6–5.1)
Alkaline phosphatase (APISO): 94 U/L (ref 37–153)
BUN: 13 mg/dL (ref 7–25)
CO2: 27 mmol/L (ref 20–32)
Calcium: 9.5 mg/dL (ref 8.6–10.4)
Chloride: 104 mmol/L (ref 98–110)
Creat: 0.93 mg/dL (ref 0.50–1.05)
Globulin: 2.1 g/dL (calc) (ref 1.9–3.7)
Glucose, Bld: 99 mg/dL (ref 65–99)
Potassium: 4.1 mmol/L (ref 3.5–5.3)
Sodium: 141 mmol/L (ref 135–146)
Total Bilirubin: 0.4 mg/dL (ref 0.2–1.2)
Total Protein: 6.6 g/dL (ref 6.1–8.1)

## 2019-08-28 LAB — HEMOGLOBIN A1C
Hgb A1c MFr Bld: 5.7 % of total Hgb — ABNORMAL HIGH (ref <5.7)
Mean Plasma Glucose: 117 (calc)
eAG (mmol/L): 6.5 (calc)

## 2019-08-28 LAB — VITAMIN D 25 HYDROXY (VIT D DEFICIENCY, FRACTURES): Vit D, 25-Hydroxy: 33 ng/mL (ref 30–100)

## 2019-08-28 LAB — IRON,TIBC AND FERRITIN PANEL
%SAT: 32 % (calc) (ref 16–45)
Ferritin: 51 ng/mL (ref 16–232)
Iron: 96 ug/dL (ref 45–160)
TIBC: 302 mcg/dL (calc) (ref 250–450)

## 2019-08-28 LAB — TSH: TSH: 0.59 mIU/L

## 2019-09-10 ENCOUNTER — Encounter: Payer: Self-pay | Admitting: Family Medicine

## 2019-09-10 ENCOUNTER — Other Ambulatory Visit: Payer: Self-pay

## 2019-09-10 ENCOUNTER — Ambulatory Visit (INDEPENDENT_AMBULATORY_CARE_PROVIDER_SITE_OTHER): Payer: BC Managed Care – PPO | Admitting: Family Medicine

## 2019-09-10 VITALS — BP 110/62 | HR 88 | Temp 98.2°F | Resp 14 | Ht 63.0 in | Wt 135.0 lb

## 2019-09-10 DIAGNOSIS — N390 Urinary tract infection, site not specified: Secondary | ICD-10-CM | POA: Diagnosis not present

## 2019-09-10 DIAGNOSIS — G43809 Other migraine, not intractable, without status migrainosus: Secondary | ICD-10-CM | POA: Diagnosis not present

## 2019-09-10 DIAGNOSIS — R300 Dysuria: Secondary | ICD-10-CM | POA: Diagnosis not present

## 2019-09-10 LAB — URINALYSIS, ROUTINE W REFLEX MICROSCOPIC
Bilirubin Urine: NEGATIVE
Glucose, UA: NEGATIVE
Hgb urine dipstick: NEGATIVE
Ketones, ur: NEGATIVE
Leukocytes,Ua: NEGATIVE
Nitrite: NEGATIVE
Protein, ur: NEGATIVE
Specific Gravity, Urine: 1.025 (ref 1.001–1.03)
pH: 6.5 (ref 5.0–8.0)

## 2019-09-10 MED ORDER — PROMETHAZINE HCL 12.5 MG RE SUPP: 12.5000 mg | Freq: Four times a day (QID) | RECTAL | 0 refills | Status: DC | PRN

## 2019-09-10 MED ORDER — CEPHALEXIN 500 MG PO CAPS: 500.0000 mg | ORAL_CAPSULE | Freq: Two times a day (BID) | ORAL | 0 refills | Status: DC

## 2019-09-10 MED ORDER — KETOROLAC TROMETHAMINE 60 MG/2ML IM SOLN
60.0000 mg | Freq: Once | INTRAMUSCULAR | Status: AC
  Administered 2019-09-10: 60 mg via INTRAMUSCULAR

## 2019-09-10 NOTE — Progress Notes (Addendum)
   Subjective:    Patient ID: Sheila [name], female    DOB: 05/20/1965, 54 y.o.   MRN: [id number]  Patient presents for Dysuria (just completed ABTx- having some discomfort in blader) and Migraine Patient here with dysuria symptoms for the past couple of days.  She does have a pessary intact and has history of recurrent urinary tract infections.  She feels twinges of pain when she went to urinate so came in to have a urinalysis done.  She has not noted any gross blood or dark urine no odor no vaginal discharge.  She has not had any abdominal pain but does have a little discomfort right at the bladder region.  No fever  Today she woke up with a migraine ibuprofen is the only thing that typically helps but she does get nausea with it.  No change in vision       Review Of Systems:  GEN- denies fatigue, fever, weight loss,weakness, recent illness HEENT- denies eye drainage, change in vision, nasal discharge, CVS- denies chest pain, palpitations RESP- denies SOB, cough, wheeze ABD- denies N/V, change in stools, abd pain GU- + dysuria, hematuria, dribbling, incontinence MSK- denies joint pain, muscle aches, injury Neuro- + headache, dizziness, syncope, seizure activity       Objective:    BP 110/62   Pulse 88   Temp 98.2 F (36.8 C) (Temporal)   Resp 14   Ht 5\' 3"  (1.6 m)   Wt 135 lb (61.2 kg)   LMP 05/06/2009   SpO2 99%   BMI 23.91 kg/m  GEN- NAD, alert and oriented x3,sitting In dark room  HEENT- PERRL, EOMI, non injected sclera, pink conjunctiva, MMM, oropharynx clear Neck- Supple, no thyromegaly CVS- RRR, no murmur RESP-CTAB ABD-NABS,soft,NT,ND NEURO-CNII-XII in tact no deficits  Pulses- Radial,  2+        Assessment & Plan:      Problem List Items Addressed This Visit      Unprioritized   Migraine headache    Migraine headache given Toradol injection in the office.  She also requested Phenergan but in the form of suppository to help with her nausea.  She  will follow-up with her neurologist.   Patient recurrent urinary tract infection.  Urinalysis in the office is clear but she also has a pessary intact.  We will go ahead and start Keflex and send urine for culture if it comes back negative she will discontinue antibiotics. Continue  to flush with fluids       Other Visit Diagnoses    Recurrent UTI    -  Primary   Relevant Medications   cephALEXin (KEFLEX) 500 MG capsule   Other Relevant Orders   Urinalysis, Routine w reflex microscopic (Completed)   Urine Culture   Dysuria       Relevant Orders   Urinalysis, Routine w reflex microscopic (Completed)      Note: This dictation was prepared with Dragon dictation along with smaller phrase technology. Any transcriptional errors that result from this process are unintentional.

## 2019-09-10 NOTE — Patient Instructions (Signed)
Start antibiotic Toradol injection given for headache Phenergan sent for nausea F/U as needed

## 2019-09-10 NOTE — Assessment & Plan Note (Signed)
Migraine headache given Toradol injection in the office.  She also requested Phenergan but in the form of suppository to help with her nausea.  She will follow-up with her neurologist.   Patient recurrent urinary tract infection.  Urinalysis in the office is clear but she also has a pessary intact.  We will go ahead and start Keflex and send urine for culture if it comes back negative she will discontinue antibiotics. Continue  to flush with fluids

## 2019-09-11 LAB — URINE CULTURE
MICRO NUMBER:: 10452496
SPECIMEN QUALITY:: ADEQUATE

## 2019-09-20 ENCOUNTER — Encounter: Payer: Self-pay | Admitting: Family Medicine

## 2019-09-21 MED ORDER — BIMATOPROST 0.03 % EX SOLN: CUTANEOUS | 0 refills | Status: DC

## 2019-11-30 ENCOUNTER — Other Ambulatory Visit: Payer: Self-pay | Admitting: Family Medicine

## 2019-12-02 ENCOUNTER — Other Ambulatory Visit: Payer: Self-pay | Admitting: Family Medicine

## 2019-12-13 ENCOUNTER — Ambulatory Visit: Payer: BC Managed Care – PPO | Admitting: Obstetrics and Gynecology

## 2019-12-13 ENCOUNTER — Encounter: Payer: Self-pay | Admitting: Obstetrics and Gynecology

## 2019-12-13 ENCOUNTER — Other Ambulatory Visit: Payer: Self-pay

## 2019-12-13 VITALS — BP 100/58 | HR 76 | Resp 16 | Ht 62.5 in | Wt 136.4 lb

## 2019-12-13 DIAGNOSIS — Z01419 Encounter for gynecological examination (general) (routine) without abnormal findings: Secondary | ICD-10-CM

## 2019-12-13 MED ORDER — MIRABEGRON ER 25 MG PO TB24: 25.0000 mg | ORAL_TABLET | Freq: Every day | ORAL | 1 refills | Status: DC

## 2019-12-13 MED ORDER — PREMARIN 0.625 MG/GM VA CREA: TOPICAL_CREAM | VAGINAL | 2 refills | Status: DC

## 2019-12-13 NOTE — Progress Notes (Signed)
54 y.o. [id number] Married Caucasian female here for annual exam.   Still wearing her pessary and using Premarin cream once a week. Does not like compounded vaginal cream.   She is having urgency and is needing to use a pad.  No leak with coughing, jumping.   She tried Myrbetriq and it did not help. She did PT with biofeedback.   Has a pinching and aching in the vagina with the pessary in place.  Just finished Bactrim 2 days ago for UTI--sees Dr.Matthews. Also taking D mannose which is helping.   She is using vit E vaginal suppositories.   Received her Pfizer vaccine.   A1C 5.7 on 08/27/19. She is working on her weight.  May do Weight [employer].   Urine dip - Negative.   PCP:   Vic Blackbird, MD  Patient's last menstrual period was 05/06/2009.           Sexually active: Yes.    The current method of family planning is status post hysterectomy.    Exercising: No.  The patient does not participate in regular exercise at present. Smoker:  no  Health Maintenance: Pap: 08-01-14 Neg:Neg HR HPV, 09-27-10 Neg History of abnormal Pap:  Yes, Hx LEEP 2006 MMG:  01-25-19 3D/Neg/density C/BiRads1 Colonoscopy: 07/16/17 Normal f/u 10 years BMD:   n/a  Result  n/a TDaP:  2016 Gardasil:   no HIV: neg in preg Hep C:no Screening Labs:  PCP.   reports that she has never smoked. She has never used smokeless tobacco. She reports that she does not drink alcohol and does not use drugs.  Past Medical History:  Diagnosis Date  . Acne   . Allergy    allergic rhinitis  . Anxiety   . Bladder stone 2018  . Depression   . Elevated hemoglobin A1c 2017   5.9  . GERD (gastroesophageal reflux disease)   . Hyperlipidemia   . Kidney stone   . Low serum vitamin D 2017   15    Past Surgical History:  Procedure Laterality Date  . ABDOMINAL HYSTERECTOMY  07/11/2009    laparosccopically assisted vaginal hysterectomy with anterior and posterior colporrhaphy, transobturator Monarch sling,  cystoscopy/ovaries remain  . APPENDECTOMY    . BLADDER SURGERY  07/2007   cystocele, pessary  . bladder tack     2nd bladder surgery  . CHOLECYSTECTOMY  2015  . COLLAGEN INJECTION  07/07/2008   Periurethral injection - Dr. Matilde Sprang  . CYST REMOVAL HAND    . EXTRACORPOREAL SHOCK WAVE LITHOTRIPSY Right 08/06/2018   Procedure: EXTRACORPOREAL SHOCK WAVE LITHOTRIPSY (ESWL);  Surgeon: Billey Co, MD;  Location: [facility] ORS;  Service: Urology;  Laterality: Right;  . INCONTINENCE SURGERY  08/04/2007   Spark miduethral sling, cystoscopy - Dr. Matilde Sprang  . NASAL SINUS SURGERY  09/2015   Dr.Byers    Current Outpatient Medications  Medication Sig Dispense Refill  . bimatoprost (LATISSE) 0.03 % ophthalmic solution Apply to eyelids at bedtime as directed 5 mL 0  . buPROPion (WELLBUTRIN XL) 150 MG 24 hr tablet TAKE 1 TABLET BY MOUTH EVERY DAY 90 tablet 1  . cholecalciferol (VITAMIN D3) 25 MCG (1000 UT) tablet Take 1,000 Units by mouth daily.    Marland Kitchen escitalopram (LEXAPRO) 20 MG tablet TAKE 1 TABLET BY MOUTH EVERY DAY 90 tablet 1  . ibuprofen (IBU) 800 MG tablet Take 1 tablet (800 mg total) by mouth every 8 (eight) hours as needed. 30 tablet 1  . pantoprazole (PROTONIX) 40 MG tablet TAKE  1 TABLET BY MOUTH EVERY DAY 90 tablet 1  . PREMARIN vaginal cream USE 1/2 G VAGINALLY TWICE OR THREE TIMES WEEKLY. 30 g 0  . promethazine (PHENERGAN) 12.5 MG suppository Place 1 suppository (12.5 mg total) rectally every 6 (six) hours as needed for nausea or vomiting. 12 each 0  . sulfamethoxazole-trimethoprim (BACTRIM DS) 800-160 MG tablet Take 1 tablet by mouth 2 (two) times daily. (Patient not taking: Reported on 12/13/2019)     No current facility-administered medications for this visit.    Family History  Problem Relation Age of Onset  . Diabetes Father   . Diabetes Maternal Grandmother   . Uterine cancer Paternal Aunt   . Breast cancer Neg Hx   . Colon cancer Neg Hx     Review of Systems   Genitourinary: Positive for frequency and urgency.  All other systems reviewed and are negative.   Exam:   BP (!) 100/58   Pulse 76   Resp 16   Ht 5' 2.5" (1.588 m)   Wt 136 lb 6.4 oz (61.9 kg)   LMP 05/06/2009   BMI 24.55 kg/m     General appearance: alert, cooperative and appears stated age Head: normocephalic, without obvious abnormality, atraumatic Neck: no adenopathy, supple, symmetrical, trachea midline and thyroid normal to inspection and palpation Lungs: clear to auscultation bilaterally Breasts: normal appearance, no masses or tenderness, No nipple retraction or dimpling, No nipple discharge or bleeding, No axillary adenopathy Heart: regular rate and rhythm Abdomen: soft, non-tender; no masses, no organomegaly Extremities: extremities normal, atraumatic, no cyanosis or edema Skin: skin color, texture, turgor normal. No rashes or lesions Lymph nodes: cervical, supraclavicular, and axillary nodes normal. Neurologic: grossly normal  Pelvic: External genitalia:  no lesions              No abnormal inguinal nodes palpated.              Urethra:  normal appearing urethra with no masses, tenderness or lesions              Bartholins and Skenes: normal                 Vagina: normal appearing vagina with normal color and discharge, no lesions.  First degree cystocele.  Minor erythema of the posterior apex 1 cm.                Cervix:absent              Pap taken: No. Bimanual Exam:  Uterus:  absent              Adnexa: no mass, fullness, tenderness              Rectal exam: Yes.  .  Confirms.              Anus:  normal sphincter tone, no lesions  Chaperone was present for exam.  Assessment:   Well woman visit with normal exam. Cystocele.Treated with pessary. Vaginal atrophy.Improvedwith vaginal estrogen cream. Urinary frequency.  Hx recurrent UTIs. Renal stones.  Hx elevated hemoglobin A1C.  Hx hyperlipidemia. Hx depression and anxiety.  Chronic HA.    Plan: Mammogram screening discussed. Self breast awareness reviewed. Pap and HR HPV as above. Guidelines for Calcium, Vitamin D, regular exercise program including cardiovascular and weight bearing exercise. We talked about 10 pounds of weight loss as a healthy plan Myrbetriq 25 mg.  #90, RF one.  Increase vaginal estrogen to 1/2 gram pv at hs  twice a week. Fu in 6 weeks.  Follow up annually and prn.   After visit summary provided.

## 2019-12-13 NOTE — Patient Instructions (Signed)

## 2019-12-14 ENCOUNTER — Ambulatory Visit: Payer: BC Managed Care – PPO | Admitting: Family Medicine

## 2019-12-15 ENCOUNTER — Other Ambulatory Visit: Payer: Self-pay

## 2019-12-15 ENCOUNTER — Ambulatory Visit: Payer: BC Managed Care – PPO | Admitting: Dermatology

## 2019-12-15 DIAGNOSIS — L565 Disseminated superficial actinic porokeratosis (DSAP): Secondary | ICD-10-CM

## 2019-12-15 DIAGNOSIS — D239 Other benign neoplasm of skin, unspecified: Secondary | ICD-10-CM

## 2019-12-15 DIAGNOSIS — L659 Nonscarring hair loss, unspecified: Secondary | ICD-10-CM | POA: Diagnosis not present

## 2019-12-15 DIAGNOSIS — D2362 Other benign neoplasm of skin of left upper limb, including shoulder: Secondary | ICD-10-CM

## 2019-12-15 DIAGNOSIS — D2361 Other benign neoplasm of skin of right upper limb, including shoulder: Secondary | ICD-10-CM | POA: Diagnosis not present

## 2019-12-15 DIAGNOSIS — D2372 Other benign neoplasm of skin of left lower limb, including hip: Secondary | ICD-10-CM

## 2019-12-15 DIAGNOSIS — D235 Other benign neoplasm of skin of trunk: Secondary | ICD-10-CM | POA: Diagnosis not present

## 2019-12-15 DIAGNOSIS — L820 Inflamed seborrheic keratosis: Secondary | ICD-10-CM | POA: Diagnosis not present

## 2019-12-15 DIAGNOSIS — L821 Other seborrheic keratosis: Secondary | ICD-10-CM | POA: Diagnosis not present

## 2019-12-15 DIAGNOSIS — D2371 Other benign neoplasm of skin of right lower limb, including hip: Secondary | ICD-10-CM

## 2019-12-15 MED ORDER — BIMATOPROST 0.03 % EX SOLN: CUTANEOUS | 3 refills | Status: DC

## 2019-12-15 NOTE — Patient Instructions (Addendum)
Recommend daily broad spectrum sunscreen SPF 30+ to sun-exposed areas, reapply every 2 hours as needed. Call for new or changing lesions.  Cryotherapy Aftercare  . Wash gently with soap and water everyday.   Marland Kitchen Apply Vaseline and Band-Aid daily until healed.  Prior to procedure, discussed risks of blister formation, small wound, skin dyspigmentation, or rare scar following cryotherapy.  Liquid nitrogen was applied for 10-12 seconds to the skin lesion and the expected blistering or scabbing reaction explained. Do not pick at the area. Patient reminded to expect hypopigmented scars from the procedure. Return if lesion fails to fully resolve.   Instructions for [facility] Medications  One or more of your medications was sent to the [facility] mail order compounding pharmacy. You will receive an email from them and can purchase the medicine through that link. It will then be mailed to your home at the address you confirmed. If for any reason you do not receive an email from them, please check your spam folder. If you still do not find the email, please let us know or call [facility] at [phone]

## 2019-12-15 NOTE — Progress Notes (Signed)
Follow-Up Visit   Subjective  Sheila [name] is a 54 y.o. female who presents for the following: Area of concern.  Patient presents today with some areas of concern on her legs, arms, neck, and upper lip that she would like to have evaluated today. The areas are itchy/irritated. She is also concerned by raised areas on the arms and legs that are not irritated.  The following portions of the chart were reviewed this encounter and updated as appropriate:  Tobacco  Allergies  Meds  Problems  Med Hx  Surg Hx  Fam Hx      Review of Systems:  No other skin or systemic complaints except as noted in HPI or Assessment and Plan.  Objective  Well appearing patient in no apparent distress; mood and affect are within normal limits.  A focused examination was performed including Legs, arms, neck and upper lip. Relevant physical exam findings are noted in the Assessment and Plan.  Objective  B/L legs, B/L Arms, Chest and Back: Diffuse tan macules with peripheral scale  Objective  Chest x 8, R. medical calf x 2 Left thigh x 5 Right lateral calf x 1, Right calf x 1, Right knee x 1, Right thigh x 3, Left shin x 2, Left calf x 4, Right Shin x 8, Right lateral thigh x 1 (28), Right Arm x 13, Left Arm x 8 (21):  Irritated Thin pink papules with peripheral scale  Objective  Left Upper Cutaneous Lip: Erythematous keratotic or waxy stuck-on papule or plaque.   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   Other benign neoplasm of skin, unspecified (50) Chest x 8; Right Arm x 13, Left Arm x 8 (21); R. medical calf x 2 Left thigh x 5 Right lateral calf x 1, Right calf x 1, Right knee x 1, Right thigh x 3, Left shin x 2, Left calf x 4, Right Shin x 8, Right lateral  thigh x 1 (28)  Symptomatic porokeratoses  Cryotherapy today Prior to procedure, discussed risks of blister formation, small wound, skin dyspigmentation, or rare scar following cryotherapy.    Destruction of lesion - Chest x 8, R. medical calf x 2 Left thigh x 5 Right lateral calf x 1, Right calf x 1, Right knee x 1, Right thigh x 3, Left shin x 2, Left calf x 4, Right Shin x 8, Right lateral thigh x 1, Right Arm x 13, Left Arm x 8  Destruction method: cryotherapy   Informed consent: discussed and consent obtained   Lesion destroyed using liquid nitrogen: Yes   Outcome: patient tolerated procedure well with no complications   Post-procedure details: wound care instructions given    Inflamed seborrheic keratosis Left Upper Cutaneous Lip  Cryotherapy today Prior to procedure, discussed risks of blister formation, small wound, skin dyspigmentation, or rare scar following cryotherapy.    Destruction of lesion - Left Upper Cutaneous Lip  Destruction method: cryotherapy   Informed consent: discussed and consent obtained   Lesion destroyed using liquid nitrogen: Yes   Outcome: patient tolerated procedure well with no complications   Post-procedure details: wound care instructions given    Hypotrichosis B/L Upper Eyelid  Continue bimatoprost 0.03% ophthalmic solution qhs  Reviewed risk of darkening of the iris.   Seborrheic Keratoses -  Stuck-on, waxy, tan-brown papules and plaques  - Discussed benign etiology and prognosis. - Observe - Call for any changes  Return in about 3 months (around 03/16/2020) for DSAP.  Marene Lenz, scribed for Alfonso Patten, MD.  Documentation: I have reviewed the above documentation for accuracy and completeness, and I agree with the above.  Forest Gleason, MD

## 2019-12-17 ENCOUNTER — Telehealth: Payer: Self-pay | Admitting: *Deleted

## 2019-12-17 ENCOUNTER — Encounter: Payer: Self-pay | Admitting: Dermatology

## 2019-12-17 NOTE — Telephone Encounter (Signed)
Call placed to patient, left detailed message, ok per dpr. Advised f/u to MyChart message regarding Myrbetriq. Recommended patient contact her insurance plan to review covered alternatives, can then review these options with Dr. Quincy Simmonds to determine a more affordable alternative. Return call to office to provide alternatives or with any additional questions.

## 2019-12-17 NOTE — Telephone Encounter (Signed)
See telephone encounter dated 12/17/19.   Encounter closed.

## 2019-12-17 NOTE — Telephone Encounter (Signed)
Coyle, Apryl Windy Carina, MD 6 hours ago (1:20 AM)   Hi Dr. Quincy Simmonds, I hope you are doing well. I wanted to let you know I went to the pharmacy today to pick up the myrbectric prescription and the cost with insurance was $141.00. Without insurance the medication is $1500! Were you aware it was so expensive? I was very surprised.   I decided since the medication is so expensive I will forgo getting it filled unless you know of a generic version that is cheaper.   If not, I will cancel the follow up appointment since that was made to discuss the results of the myrbectric.   Thank you so much for everything and have a great weekend, Sheila [name]

## 2019-12-26 ENCOUNTER — Encounter: Payer: Self-pay | Admitting: Dermatology

## 2019-12-27 NOTE — Telephone Encounter (Signed)
Left message to call Sharee Pimple, RN at [facility].  Called to f/u on alternative medication.

## 2019-12-29 ENCOUNTER — Telehealth: Payer: Self-pay

## 2019-12-29 ENCOUNTER — Ambulatory Visit: Payer: BC Managed Care – PPO | Admitting: Dermatology

## 2019-12-29 NOTE — Telephone Encounter (Signed)
Patient called to cancel follow up visit. Patient did not wish to reschedule but would like to speak with nurse.

## 2019-12-29 NOTE — Telephone Encounter (Signed)
Spoke with patient. Patient calling to provide update. Patient states out of pocket cost for Myrbetriq $140 for 90 day supply. Patient does not want to start RX at this time, declines alternatives. States she will contact the office if any new symptoms develop or symptoms worsen. F/u OV cancelled. Patient thankful for return call.  Med list updated.   Routing to provider for final review. Patient is agreeable to disposition. Will close encounter.

## 2019-12-29 NOTE — Telephone Encounter (Signed)
No return call from patient.  Left detailed message on 12/17/19.  Routing to Dr. Antony Blackbird.  Encounter closed.

## 2020-01-04 ENCOUNTER — Other Ambulatory Visit: Payer: Self-pay | Admitting: Obstetrics and Gynecology

## 2020-01-04 DIAGNOSIS — Z1231 Encounter for screening mammogram for malignant neoplasm of breast: Secondary | ICD-10-CM

## 2020-01-24 ENCOUNTER — Ambulatory Visit: Payer: Self-pay | Admitting: Obstetrics and Gynecology

## 2020-01-25 ENCOUNTER — Other Ambulatory Visit: Payer: Self-pay | Admitting: Family Medicine

## 2020-01-26 ENCOUNTER — Ambulatory Visit
Admission: RE | Admit: 2020-01-26 | Discharge: 2020-01-26 | Disposition: A | Discharge status: Home / Self Care | Payer: BC Managed Care – PPO | Source: Ambulatory Visit | Attending: Obstetrics and Gynecology | Admitting: Obstetrics and Gynecology

## 2020-01-26 ENCOUNTER — Other Ambulatory Visit: Payer: Self-pay

## 2020-01-26 DIAGNOSIS — Z1231 Encounter for screening mammogram for malignant neoplasm of breast: Secondary | ICD-10-CM

## 2020-02-08 ENCOUNTER — Telehealth: Payer: Self-pay

## 2020-02-08 NOTE — Telephone Encounter (Signed)
Patient got in touch with Sheila [name] today stating she has not heard from [facility]/no emails from stopping in last week. I have called [facility] and left message to please reach out to [place] and let us know the status update on this patient.

## 2020-02-09 NOTE — Telephone Encounter (Signed)
Emailed [facility] to request a call back to patient and our office.

## 2020-02-09 NOTE — Telephone Encounter (Signed)
Per [facility]: "We have called Sheila [name] twice today, it seems as if she answers then hangs up and we are unable to leave a voicemail for her. Please let us know if we can be of further assistance. "

## 2020-02-15 NOTE — Telephone Encounter (Signed)
Spoke with patient today and gave her the information to contact [facility].

## 2020-03-22 ENCOUNTER — Ambulatory Visit: Payer: BC Managed Care – PPO | Admitting: Dermatology

## 2020-04-25 ENCOUNTER — Other Ambulatory Visit: Payer: Self-pay | Admitting: Family Medicine

## 2020-05-03 ENCOUNTER — Ambulatory Visit: Payer: BC Managed Care – PPO | Admitting: Nurse Practitioner

## 2020-05-03 ENCOUNTER — Other Ambulatory Visit: Payer: Self-pay

## 2020-05-03 VITALS — BP 118/74 | HR 96 | Temp 98.1°F | Ht 62.5 in | Wt 134.4 lb

## 2020-05-03 DIAGNOSIS — H66002 Acute suppurative otitis media without spontaneous rupture of ear drum, left ear: Secondary | ICD-10-CM | POA: Diagnosis not present

## 2020-05-03 DIAGNOSIS — K148 Other diseases of tongue: Secondary | ICD-10-CM | POA: Diagnosis not present

## 2020-05-03 MED ORDER — VALACYCLOVIR HCL 1 G PO TABS: 1000.0000 mg | ORAL_TABLET | Freq: Every day | ORAL | 0 refills | Status: DC

## 2020-05-03 MED ORDER — AMOXICILLIN 875 MG PO TABS: 875.0000 mg | ORAL_TABLET | Freq: Two times a day (BID) | ORAL | 0 refills | Status: DC

## 2020-05-03 NOTE — Assessment & Plan Note (Signed)
Acute, ongoing x about 2 weeks.  With mild erythema of left auditory canal on examination and length of symptoms, will start amoxicillin 875 mg bid for 7 days.  If symptoms do not improve by Monday, consider alternative antibiotic. May also consider consultation with ENT provider as has had prior septum surgery for recurring issues of similar nature.  Encouraged to resume flonase/zyrtec regimen for allergic rhinitis.

## 2020-05-03 NOTE — Progress Notes (Signed)
Subjective:    Patient ID: Sheila [name], female    DOB: [birth year], 54 y.o.   MRN: [id number]  HPI: Sheila [name] is a 54 y.o. female presenting for right ear pain.  Chief Complaint  Patient presents with  . Ear Pain    Sinus pressure due to lack of drainage. Pain in the left ear only for 1 wk. Had surgery in the nose to open septum in 2017   EAR PAIN Duration: weeks Involved ear(s): left Severity:  4/10  Quality:  Dull ache Fever: no Otorrhea: no  Nosebleeds: yes Upper respiratory infection symptoms: headache, frontal sinus pressure, sore throat, congestion Pruritus: no Hearing loss: no Water immersion no Using Q-tips: no Recurrent otitis media: no Status: worse Treatments attempted: ibuprofen 800 mg, hydrogen peroxide, salt water Of note, has had nasal septum surgery in 2017 to help open the septum.  No Known Allergies  Outpatient Encounter Medications as of 05/03/2020  Medication Sig  . amoxicillin (AMOXIL) 875 MG tablet Take 1 tablet (875 mg total) by mouth 2 (two) times daily.  . bimatoprost (LATISSE) 0.03 % ophthalmic solution Apply to eyelids at bedtime as directed  . buPROPion (WELLBUTRIN XL) 150 MG 24 hr tablet TAKE 1 TABLET BY MOUTH EVERY DAY  . cholecalciferol (VITAMIN D3) 25 MCG (1000 UT) tablet Take 1,000 Units by mouth daily.  Marland Kitchen conjugated estrogens (PREMARIN) vaginal cream Place 1/2 gram per vagina at hs 2 times per week.  . escitalopram (LEXAPRO) 20 MG tablet TAKE 1 TABLET BY MOUTH EVERY DAY  . ibuprofen (ADVIL) 800 MG tablet TAKE 1 TABLET BY MOUTH EVERY 8 HOURS AS NEEDED  . pantoprazole (PROTONIX) 40 MG tablet TAKE 1 TABLET BY MOUTH EVERY DAY  . promethazine (PHENERGAN) 12.5 MG suppository Place 1 suppository (12.5 mg total) rectally every 6 (six) hours as needed for nausea or vomiting.  . valACYclovir (VALTREX) 1000 MG tablet Take 1 tablet (1,000 mg total) by mouth daily. Take at onset of outbreak for 5 days.  . [DISCONTINUED]  nitrofurantoin (MACRODANTIN) 50 MG capsule Take 50 mg by mouth daily.  . [DISCONTINUED] sulfamethoxazole-trimethoprim (BACTRIM DS) 800-160 MG tablet Take 1 tablet by mouth 2 (two) times daily.    No facility-administered encounter medications on file as of 05/03/2020.    Patient Active Problem List   Diagnosis Date Noted  . Non-recurrent acute suppurative otitis media of left ear without spontaneous rupture of tympanic membrane 05/03/2020  . Snoring 03/23/2019  . Periodic limb movements of sleep 03/23/2019  . Sleep-disordered breathing 12/28/2018  . Chronic headache 12/21/2018  . Sleep apnea 12/20/2018  . Digital mucous cyst 09/24/2018  . Ureteral stone with hydronephrosis 08/05/2018  . Vaginal adhesions 07/28/2017  . Female bladder prolapse 07/28/2017  . Borderline diabetes 12/05/2015  . Peripheral edema 01/17/2014  . IBS (irritable bowel syndrome) 06/15/2013  . Migraine headache 02/21/2013  . Dizziness 01/27/2013  . Depression 10/13/2012  . CONSTIPATION 07/11/2010  . GERD (gastroesophageal reflux disease) 03/23/2007  . HYPERCHOLESTEROLEMIA, PURE 10/14/2006  . GAD (generalized anxiety disorder) 10/13/2006  . ALLERGIC RHINITIS 10/13/2006  . STRESS INCONTINENCE 10/13/2006    Past Medical History:  Diagnosis Date  . Acne   . Allergy    allergic rhinitis  . Anxiety   . Bladder stone 2018  . Depression   . Elevated hemoglobin A1c 2017   5.9  . GERD (gastroesophageal reflux disease)   . Hyperlipidemia   . Kidney stone   . Low serum vitamin D 2017  15    Relevant past medical, surgical, family and social history reviewed and updated as indicated. Interim medical history since our last visit reviewed.  Review of Systems  Constitutional: Negative.  Negative for activity change, appetite change and fever.  HENT: Positive for ear pain, mouth sores, sinus pressure and sore throat. Negative for congestion, ear discharge, hearing loss, postnasal drip, rhinorrhea, sinus pain  and sneezing.   Eyes: Negative.   Musculoskeletal: Negative.   Skin: Negative.   Neurological: Positive for headaches.  Psychiatric/Behavioral: Negative.     Per HPI unless specifically indicated above     Objective:    BP 118/74   Pulse 96   Temp 98.1 F (36.7 C)   Ht 5' 2.5" (1.588 m)   Wt 134 lb 6.4 oz (61 kg)   LMP 05/06/2009   SpO2 96%   BMI 24.19 kg/m   Wt Readings from Last 3 Encounters:  05/03/20 134 lb 6.4 oz (61 kg)  12/13/19 136 lb 6.4 oz (61.9 kg)  09/10/19 135 lb (61.2 kg)    Physical Exam Vitals and nursing note reviewed.  Constitutional:      General: She is not in acute distress.    Appearance: Normal appearance. She is not toxic-appearing.  HENT:     Head: Normocephalic and atraumatic.     Right Ear: Tympanic membrane, ear canal and external ear normal.     Left Ear: Hearing and tympanic membrane normal.     Ears:     Comments: Erythematous canal left ear    Nose: Congestion present. No rhinorrhea.     Mouth/Throat:     Mouth: Mucous membranes are moist.     Pharynx: Oropharynx is clear. No posterior oropharyngeal erythema.  Eyes:     General: No scleral icterus.       Right eye: No discharge.        Left eye: No discharge.     Extraocular Movements: Extraocular movements intact.  Skin:    General: Skin is warm and dry.     Coloration: Skin is not jaundiced or pale.     Findings: No erythema.  Neurological:     Mental Status: She is alert and oriented to person, place, and time.     Motor: No weakness.     Gait: Gait normal.  Psychiatric:        Mood and Affect: Mood normal.        Behavior: Behavior normal.        Thought Content: Thought content normal.        Judgment: Judgment normal.       Assessment & Plan:   Problem List Items Addressed This Visit      Nervous and Auditory   Non-recurrent acute suppurative otitis media of left ear without spontaneous rupture of tympanic membrane - Primary    Acute, ongoing x about 2 weeks.   With mild erythema of left auditory canal on examination and length of symptoms, will start amoxicillin 875 mg bid for 7 days.  If symptoms do not improve by Monday, consider alternative antibiotic. May also consider consultation with ENT provider as has had prior septum surgery for recurring issues of similar nature.  Encouraged to resume flonase/zyrtec regimen for allergic rhinitis.        Relevant Medications   amoxicillin (AMOXIL) 875 MG tablet   valACYclovir (VALTREX) 1000 MG tablet    Other Visit Diagnoses    Tongue lesion       Canker  sore vs. HSV lesion.  Treat with valtrex x 5 days.  Continue mouth rinses.  If lesion persists, return to clinic.   Relevant Medications   valACYclovir (VALTREX) 1000 MG tablet       Follow up plan: Return if symptoms worsen or fail to improve.

## 2020-06-07 ENCOUNTER — Other Ambulatory Visit: Payer: Self-pay

## 2020-06-07 ENCOUNTER — Encounter: Payer: Self-pay | Admitting: Nurse Practitioner

## 2020-06-07 ENCOUNTER — Ambulatory Visit (INDEPENDENT_AMBULATORY_CARE_PROVIDER_SITE_OTHER): Payer: BC Managed Care – PPO | Admitting: Nurse Practitioner

## 2020-06-07 VITALS — BP 106/80 | HR 73 | Temp 97.3°F | Ht 62.5 in | Wt 136.4 lb

## 2020-06-07 DIAGNOSIS — R399 Unspecified symptoms and signs involving the genitourinary system: Secondary | ICD-10-CM | POA: Diagnosis not present

## 2020-06-07 DIAGNOSIS — R7303 Prediabetes: Secondary | ICD-10-CM

## 2020-06-07 LAB — URINALYSIS, ROUTINE W REFLEX MICROSCOPIC
Bilirubin Urine: NEGATIVE
Glucose, UA: NEGATIVE
Hgb urine dipstick: NEGATIVE
Ketones, ur: NEGATIVE
Leukocytes,Ua: NEGATIVE
Nitrite: NEGATIVE
Protein, ur: NEGATIVE
Specific Gravity, Urine: 1.01 (ref 1.001–1.03)
pH: 6.5 (ref 5.0–8.0)

## 2020-06-07 MED ORDER — NITROFURANTOIN MACROCRYSTAL 50 MG PO CAPS: 50.0000 mg | ORAL_CAPSULE | Freq: Once | ORAL | 0 refills | Status: DC | PRN

## 2020-06-07 MED ORDER — SULFAMETHOXAZOLE-TRIMETHOPRIM 800-160 MG PO TABS: 1.0000 | ORAL_TABLET | Freq: Two times a day (BID) | ORAL | 0 refills | Status: DC | PRN

## 2020-06-07 NOTE — Progress Notes (Signed)
Subjective:    Patient ID: Sheila [name], female    DOB: February 10, 1966, 55 y.o.   MRN: [id number]  HPI: Sheila [name] is a 55 y.o. female presenting for urine odor.  Chief Complaint  Patient presents with  . urine odor    Says she gets reoccuring uti's. Has urine odor for 2 days now, with some pain while urination. Takes meds given by uro gyn to take before sex to help with uti   URINARY SYMPTOMS Sees a Urogynecologist for recurrent UTI.  Has been given Macrobid for after sexual intercourse, Bactrim for acute symptoms of UTI.  Is asking for refills of these today.  Takes D-mannose for UTI prevention daily.  Uses a pessary with premarin cream to prevent prolapse and painful intercourse. Duration: 2 days Dysuria: no Urinary frequency: no Urgency: no Small volume voids: no Symptom severity: mild Urinary incontinence: no Foul odor: yes; thinks may be concentrated Hematuria: no Abdominal pain: no Back pain: no Suprapubic pain/pressure: no Flank pain: no Fever: no Nausea: no Vomiting: no Relief with cranberry juice: not tried Relief with pyridium: not tried Status: stable Previous urinary tract infection: yes Recurrent urinary tract infection: no Sexual activity: No sexually active/monogomous/practicing safe sex History of sexually transmitted disease: no Vaginal discharge: no Treatments attempted: D-mannose, increasing fluids   PRE-DIABETES Hypoglycemic episodes:no Polydipsia/polyuria: no Visual disturbance: no Chest pain: no Paresthesias: no Glucose Monitoring: borrows father's glucose meter and checks randomly  Accucheck frequency: random  Fasting glucose: 110s Taking Insulin?: no Blood Pressure Monitoring: not checking  No Known Allergies  Outpatient Encounter Medications as of 06/07/2020  Medication Sig  . buPROPion (WELLBUTRIN XL) 150 MG 24 hr tablet TAKE 1 TABLET BY MOUTH EVERY DAY  . cholecalciferol (VITAMIN D3) 25 MCG (1000 UT) tablet Take 1,000  Units by mouth daily.  Marland Kitchen conjugated estrogens (PREMARIN) vaginal cream Place 1/2 gram per vagina at hs 2 times per week.  . escitalopram (LEXAPRO) 20 MG tablet TAKE 1 TABLET BY MOUTH EVERY DAY  . ibuprofen (ADVIL) 800 MG tablet TAKE 1 TABLET BY MOUTH EVERY 8 HOURS AS NEEDED  . nitrofurantoin (MACRODANTIN) 50 MG capsule Take 1 capsule (50 mg total) by mouth once as needed for up to 1 dose (within 4 hours of intercourse). Take 1 tablet within 4 hours of intercourse  . pantoprazole (PROTONIX) 40 MG tablet TAKE 1 TABLET BY MOUTH EVERY DAY  . promethazine (PHENERGAN) 12.5 MG suppository Place 1 suppository (12.5 mg total) rectally every 6 (six) hours as needed for nausea or vomiting.  . sulfamethoxazole-trimethoprim (BACTRIM DS) 800-160 MG tablet Take 1 tablet by mouth 2 (two) times daily as needed (symptoms of UTI). Take 1 tablet twice daily for 3 days at onset of UTI symptoms.  . [DISCONTINUED] amoxicillin (AMOXIL) 875 MG tablet Take 1 tablet (875 mg total) by mouth 2 (two) times daily.  . [DISCONTINUED] bimatoprost (LATISSE) 0.03 % ophthalmic solution Apply to eyelids at bedtime as directed  . [DISCONTINUED] valACYclovir (VALTREX) 1000 MG tablet Take 1 tablet (1,000 mg total) by mouth daily. Take at onset of outbreak for 5 days.   No facility-administered encounter medications on file as of 06/07/2020.    Patient Active Problem List   Diagnosis Date Noted  . Non-recurrent acute suppurative otitis media of left ear without spontaneous rupture of tympanic membrane 05/03/2020  . Snoring 03/23/2019  . Periodic limb movements of sleep 03/23/2019  . Sleep-disordered breathing 12/28/2018  . Chronic headache 12/21/2018  . Sleep apnea 12/20/2018  .  Digital mucous cyst 09/24/2018  . Ureteral stone with hydronephrosis 08/05/2018  . Vaginal adhesions 07/28/2017  . Female bladder prolapse 07/28/2017  . Borderline diabetes 12/05/2015  . Peripheral edema 01/17/2014  . IBS (irritable bowel syndrome)  06/15/2013  . Migraine headache 02/21/2013  . Dizziness 01/27/2013  . Depression 10/13/2012  . CONSTIPATION 07/11/2010  . GERD (gastroesophageal reflux disease) 03/23/2007  . HYPERCHOLESTEROLEMIA, PURE 10/14/2006  . GAD (generalized anxiety disorder) 10/13/2006  . ALLERGIC RHINITIS 10/13/2006  . STRESS INCONTINENCE 10/13/2006    Past Medical History:  Diagnosis Date  . Acne   . Allergy    allergic rhinitis  . Anxiety   . Bladder stone 2018  . Depression   . Elevated hemoglobin A1c 2017   5.9  . GERD (gastroesophageal reflux disease)   . Hyperlipidemia   . Kidney stone   . Low serum vitamin D 2017   15    Relevant past medical, surgical, family and social history reviewed and updated as indicated. Interim medical history since our last visit reviewed.  Review of Systems Per HPI unless specifically indicated above     Objective:    BP 106/80   Pulse 73   Temp (!) 97.3 F (36.3 C)   Ht 5' 2.5" (1.588 m)   Wt 136 lb 6.4 oz (61.9 kg)   LMP 05/06/2009   SpO2 97%   BMI 24.55 kg/m   Wt Readings from Last 3 Encounters:  06/07/20 136 lb 6.4 oz (61.9 kg)  05/03/20 134 lb 6.4 oz (61 kg)  12/13/19 136 lb 6.4 oz (61.9 kg)    Physical Exam Vitals and nursing note reviewed.  Constitutional:      General: She is not in acute distress.    Appearance: Normal appearance. She is not toxic-appearing.  Abdominal:     General: There is no distension.     Palpations: Abdomen is soft. There is no mass.     Tenderness: There is no abdominal tenderness. There is no right CVA tenderness or left CVA tenderness.  Skin:    General: Skin is warm and dry.     Coloration: Skin is not jaundiced or pale.     Findings: No erythema.  Neurological:     Mental Status: She is alert and oriented to person, place, and time.  Psychiatric:        Mood and Affect: Mood normal.        Behavior: Behavior normal.        Thought Content: Thought content normal.        Judgment: Judgment normal.     Results for orders placed or performed in visit on 06/07/20  Urinalysis, Routine w reflex microscopic  Result Value Ref Range   Color, Urine YELLOW YELLOW   APPearance SLIGHTLY CLOUDY (A) CLEAR   Specific Gravity, Urine 1.010 1.001 - 1.03   pH 6.5 5.0 - 8.0   Glucose, UA NEGATIVE NEGATIVE   Bilirubin Urine NEGATIVE NEGATIVE   Ketones, ur NEGATIVE NEGATIVE   Hgb urine dipstick NEGATIVE NEGATIVE   Protein, ur NEGATIVE NEGATIVE   Nitrite NEGATIVE NEGATIVE   Leukocytes,Ua NEGATIVE NEGATIVE      Assessment & Plan:  1. UTI symptoms Acute, ongoing x 2 days.  UA today in clinic negative.  Will send for culture given symptoms and frequent infection.  Will also provide patient with prevention medication as she has run out.  Will need to get refills from urogyn.  - Urinalysis, Routine w reflex microscopic - Urine  Culture - nitrofurantoin (MACRODANTIN) 50 MG capsule; Take 1 capsule (50 mg total) by mouth once as needed for up to 1 dose (within 4 hours of intercourse). Take 1 tablet within 4 hours of intercourse  Dispense: 30 capsule; Refill: 0 - sulfamethoxazole-trimethoprim (BACTRIM DS) 800-160 MG tablet; Take 1 tablet by mouth 2 (two) times daily as needed (symptoms of UTI). Take 1 tablet twice daily for 3 days at onset of UTI symptoms.  Dispense: 12 tablet; Refill: 0  2. Prediabetes Chronic, ongoing.  Will recheck Hgba1c today and follow.  No s/s diabetes today. - Hemoglobin A1c   Follow up plan: Return if symptoms worsen or fail to improve.

## 2020-06-08 ENCOUNTER — Other Ambulatory Visit: Payer: Self-pay

## 2020-06-08 ENCOUNTER — Ambulatory Visit: Payer: BC Managed Care – PPO | Admitting: Dermatology

## 2020-06-08 DIAGNOSIS — L988 Other specified disorders of the skin and subcutaneous tissue: Secondary | ICD-10-CM | POA: Diagnosis not present

## 2020-06-08 DIAGNOSIS — L565 Disseminated superficial actinic porokeratosis (DSAP): Secondary | ICD-10-CM | POA: Diagnosis not present

## 2020-06-08 DIAGNOSIS — L821 Other seborrheic keratosis: Secondary | ICD-10-CM | POA: Diagnosis not present

## 2020-06-08 LAB — HEMOGLOBIN A1C
Hgb A1c MFr Bld: 5.9 % of total Hgb — ABNORMAL HIGH (ref <5.7)
Mean Plasma Glucose: 123 mg/dL
eAG (mmol/L): 6.8 mmol/L

## 2020-06-08 LAB — URINE CULTURE
MICRO NUMBER:: 11486900
SPECIMEN QUALITY:: ADEQUATE

## 2020-06-08 NOTE — Progress Notes (Signed)
   Follow-Up Visit   Subjective  Sheila [name] is a 55 y.o. female who presents for the following: 3 month follow up  (Patient is here today for follow up on Dermatitis, disseminated superficial actinic porokeratosis (DSAP), patient states she is using cream at night only and feels it is helping areas affected. ).  The following portions of the chart were reviewed this encounter and updated as appropriate:  Tobacco  Allergies  Meds  Problems  Med Hx  Surg Hx  Fam Hx      Objective  Well appearing patient in no apparent distress; mood and affect are within normal limits.  A focused examination was performed including face, neck, bilateral arms . Relevant physical exam findings are noted in the Assessment and Plan.  Objective  bilateral arms, bilateral legs, chest, back: Thin scaly pink papules  Head - Anterior (Face)  Images    Assessment & Plan  Dermatitis, disseminated superficial actinic porokeratosis (DSAP) bilateral arms, bilateral legs, chest, back  Chronic condition with duration over one year. Currently well-controlled.  Continue Prescription [facility] Cholesterol-Lovastatin Ointment twice daily as needed.  Elastosis of skin Head - Anterior (Face)  Discussed botox treatment today and discussed dosing treatment for next appointment   Suggest Botox brow lift   48 units x $13  total 624   Suggest Alastin eye cream for eyes   Discussed treatment options for neck elasticity  Elastin neck cream  Neostride triple firm cream  Skintyte treatment  Seborrheic Keratoses Left upper lip - Stuck-on, waxy, tan-brown papules and plaques  - Discussed benign etiology and prognosis. - Observe - Call for any changes    Return in about 30 days (around 07/08/2020) for botox appointment,  tbse.  I, Ruthell Rummage, CMA, am acting as scribe for Forest Gleason, MD.  Documentation: I have reviewed the above documentation for accuracy and completeness, and I agree  with the above.  Forest Gleason, MD

## 2020-06-08 NOTE — Patient Instructions (Addendum)
Instructions for [facility] Medications  One or more of your medications was sent to the [facility] mail order compounding pharmacy. You will receive an email from them and can purchase the medicine through that link. It will then be mailed to your home at the address you confirmed. If for any reason you do not receive an email from them, please check your spam folder. If you still do not find the email, please let us know. [facility] phone number is [phone].    Recommend Neostrata triple firming neck cream. Also recommend skintyte procedure for neck.  [email or web address]  Recommend Alastin eye cream use 1/2 pump twice daily for eye area

## 2020-06-18 ENCOUNTER — Other Ambulatory Visit: Payer: Self-pay | Admitting: Family Medicine

## 2020-06-20 ENCOUNTER — Ambulatory Visit: Payer: BC Managed Care – PPO | Admitting: Dermatology

## 2020-07-03 ENCOUNTER — Encounter: Payer: Self-pay | Admitting: Dermatology

## 2020-09-28 ENCOUNTER — Other Ambulatory Visit: Payer: Self-pay

## 2020-09-28 ENCOUNTER — Ambulatory Visit: Payer: BC Managed Care – PPO | Admitting: Dermatology

## 2020-09-28 DIAGNOSIS — L988 Other specified disorders of the skin and subcutaneous tissue: Secondary | ICD-10-CM | POA: Diagnosis not present

## 2020-09-28 DIAGNOSIS — L570 Actinic keratosis: Secondary | ICD-10-CM | POA: Diagnosis not present

## 2020-09-28 DIAGNOSIS — L821 Other seborrheic keratosis: Secondary | ICD-10-CM

## 2020-09-28 DIAGNOSIS — L820 Inflamed seborrheic keratosis: Secondary | ICD-10-CM

## 2020-09-28 NOTE — Patient Instructions (Addendum)
Cryotherapy Aftercare  . Wash gently with soap and water everyday.   Marland Kitchen Apply Vaseline and Band-Aid daily until healed.   Actinic keratoses are precancerous spots that appear secondary to cumulative UV radiation exposure/sun exposure over time. They are chronic with expected duration over 1 year. A portion of actinic keratoses will progress to squamous cell carcinoma of the skin. It is not possible to reliably predict which spots will progress to skin cancer and so treatment is recommended to prevent development of skin cancer.  Recommend daily broad spectrum sunscreen SPF 30+ to sun-exposed areas, reapply every 2 hours as needed.  Recommend staying in the shade or wearing long sleeves, sun glasses (UVA+UVB protection) and wide brim hats (4-inch brim around the entire circumference of the hat). Call for new or changing lesions.  Seborrheic Keratosis  What causes seborrheic keratoses? Seborrheic keratoses are harmless, common skin growths that first appear during adult life.  As time goes by, more growths appear.  Some people may develop a large number of them.  Seborrheic keratoses appear on both covered and uncovered body parts.  They are not caused by sunlight.  The tendency to develop seborrheic keratoses can be inherited.  They vary in color from skin-colored to gray, brown, or even black.  They can be either smooth or have a rough, warty surface.   Seborrheic keratoses are superficial and look as if they were stuck on the skin.  Under the microscope this type of keratosis looks like layers upon layers of skin.  That is why at times the top layer may seem to fall off, but the rest of the growth remains and re-grows.    Treatment Seborrheic keratoses do not need to be treated, but can easily be removed in the office.  Seborrheic keratoses often cause symptoms when they rub on clothing or jewelry.  Lesions can be in the way of shaving.  If they become inflamed, they can cause itching, soreness,  or burning.  Removal of a seborrheic keratosis can be accomplished by freezing, burning, or surgery. If any spot bleeds, scabs, or grows rapidly, please return to have it checked, as these can be an indication of a skin cancer.  Recommend daily broad spectrum sunscreen SPF 30+ to sun-exposed areas, reapply every 2 hours as needed. Call for new or changing lesions.  Staying in the shade or wearing long sleeves, sun glasses (UVA+UVB protection) and wide brim hats (4-inch brim around the entire circumference of the hat) are also recommended for sun protection.     If you have any questions or concerns for your doctor, please call our main line at [phone] and press option 4 to reach your doctor's medical assistant. If no one answers, please leave a voicemail as directed and we will return your call as soon as possible. Messages left after 4 pm will be answered the following business day.   You may also send [place] a message via [place]. We typically respond to MyChart messages within 1-2 business days.  For prescription refills, please ask your pharmacy to contact our office. Our fax number is [phone].  If you have an urgent issue when the clinic is closed that cannot wait until the next business day, you can page your doctor at the number below.    Please note that while we do our best to be available for urgent issues outside of office hours, we are not available 24/7.   If you have an urgent issue and are unable to reach  [place], you may choose to seek medical care at your doctor's office, retail clinic, urgent care center, or emergency room.  If you have a medical emergency, please immediately call 911 or go to the emergency department.  Pager Numbers  - Dr. Nehemiah Massed: [phone]  - Dr. Laurence Ferrari: [phone]  - Dr. Nicole Kindred: [phone]  In the event of inclement weather, please call our main line at [phone] for an update on the status of any delays or closures.  Dermatology  Medication Tips: Please keep the boxes that topical medications come in in order to help keep track of the instructions about where and how to use these. Pharmacies typically print the medication instructions only on the boxes and not directly on the medication tubes.   If your medication is too expensive, please contact our office at [phone] option 4 or send [place] a message through [place].   We are unable to tell what your co-pay for medications will be in advance as this is different depending on your insurance coverage. However, we may be able to find a substitute medication at lower cost or fill out paperwork to get insurance to cover a needed medication.   If a prior authorization is required to get your medication covered by your insurance company, please allow [place] 1-2 business days to complete this process.  Drug prices often vary depending on where the prescription is filled and some pharmacies may offer cheaper prices.  The website www.goodrx.com contains coupons for medications through different pharmacies. The prices here do not account for what the cost may be with help from insurance (it may be cheaper with your insurance), but the website can give you the price if you did not use any insurance.  - You can print the associated coupon and take it with your prescription to the pharmacy.  - You may also stop by our office during regular business hours and pick up a GoodRx coupon card.  - If you need your prescription sent electronically to a different pharmacy, notify our office through [facility] or by phone at [phone] option 4.

## 2020-09-28 NOTE — Progress Notes (Signed)
   Follow-Up Visit   Subjective  Sheila [name] is a 55 y.o. female who presents for the following: Facial Elastosis (Patient is here today for botox follow up from March. She would like to talk about having eye area done today. She would also like to discuss age spot removal on face and hands. ).  She also notes irritation from [facility] and asks about alternatives.  The following portions of the chart were reviewed this encounter and updated as appropriate:  Tobacco  Allergies  Meds  Problems  Med Hx  Surg Hx  Fam Hx      Objective  Well appearing patient in no apparent distress; mood and affect are within normal limits.  A focused examination was performed including face, left hand. Relevant physical exam findings are noted in the Assessment and Plan.  Objective  face: Rhytides and volume loss.   Images    Objective  Left Dorsal Hand: Erythematous thin papules/macules with gritty scale.   Assessment & Plan  Elastosis of skin face  22 units of botox x 13 units  = $286 crows feet area  Pt with irritation to [place] Recommended Revitalash or [employer] as alternatives. Advised may have same side effect risks and less data on benefit compared to [facility]   Botox Injection - face Location: bilateral crows feet   Informed consent: Discussed risks (infection, pain, bleeding, bruising, swelling, allergic reaction, paralysis of nearby muscles, eyelid droop, double vision, neck weakness, difficulty breathing, headache, undesirable cosmetic result, and need for additional treatment) and benefits of the procedure, as well as the alternatives.  Informed consent was obtained.  Preparation: The area was cleansed with alcohol.  Procedure Details:  Botox was injected into the dermis with a 30-gauge needle. Pressure applied to any bleeding. Ice packs offered for swelling.  Lot Number:  [id number] Expiration:  09/2022  Total Units Injected:  22  Plan: Patient  was instructed to remain upright for 4 hours. Patient was instructed to avoid massaging the face and avoid vigorous exercise for the rest of the day. Tylenol may be used for headache.  Allow 2 weeks before returning to clinic for additional dosing as needed. Patient will call for any problems.   Actinic keratosis Left Dorsal Hand  Prior to procedure, discussed risks of blister formation, small wound, skin dyspigmentation, or rare scar following cryotherapy.   Will recheck at follow up  Destruction of lesion - Left Dorsal Hand  Destruction method: cryotherapy   Informed consent: discussed and consent obtained   Lesion destroyed using liquid nitrogen: Yes   Cryotherapy cycles:  2 Outcome: patient tolerated procedure well with no complications   Post-procedure details: wound care instructions given    Seborrheic Keratoses - Stuck-on, waxy, tan-brown papules and/or plaques  - Benign-appearing - Discussed benign etiology and prognosis. - Observe - Call for any changes  Return in 4 months (on 01/29/2021) for  brow lift and crow feet area and ak, isk follow up at left hand , 1 year tbse (06/08/2021). I, Sheila [name], CMA, am acting as scribe for Sheila Gleason, MD.  Documentation: I have reviewed the above documentation for accuracy and completeness, and I agree with the above.  Sheila Gleason, MD

## 2020-10-02 ENCOUNTER — Encounter: Payer: Self-pay | Admitting: Dermatology

## 2020-10-05 ENCOUNTER — Ambulatory Visit: Payer: BC Managed Care – PPO | Admitting: Internal Medicine

## 2020-10-05 ENCOUNTER — Other Ambulatory Visit: Payer: Self-pay

## 2020-10-05 ENCOUNTER — Encounter: Payer: Self-pay | Admitting: Internal Medicine

## 2020-10-05 VITALS — BP 108/70 | HR 60 | Temp 98.1°F | Resp 18 | Ht 62.5 in | Wt 135.2 lb

## 2020-10-05 DIAGNOSIS — R5383 Other fatigue: Secondary | ICD-10-CM | POA: Diagnosis not present

## 2020-10-05 DIAGNOSIS — E7800 Pure hypercholesterolemia, unspecified: Secondary | ICD-10-CM

## 2020-10-05 DIAGNOSIS — F411 Generalized anxiety disorder: Secondary | ICD-10-CM

## 2020-10-05 DIAGNOSIS — F331 Major depressive disorder, recurrent, moderate: Secondary | ICD-10-CM | POA: Diagnosis not present

## 2020-10-05 DIAGNOSIS — R7303 Prediabetes: Secondary | ICD-10-CM | POA: Diagnosis not present

## 2020-10-05 DIAGNOSIS — G43809 Other migraine, not intractable, without status migrainosus: Secondary | ICD-10-CM

## 2020-10-05 DIAGNOSIS — K582 Mixed irritable bowel syndrome: Secondary | ICD-10-CM

## 2020-10-05 DIAGNOSIS — K219 Gastro-esophageal reflux disease without esophagitis: Secondary | ICD-10-CM

## 2020-10-05 LAB — CBC
HCT: 38.9 % (ref 36.0–46.0)
Hemoglobin: 13.4 g/dL (ref 12.0–15.0)
MCHC: 34.4 g/dL (ref 30.0–36.0)
MCV: 89.8 fl (ref 78.0–100.0)
Platelets: 256 10*3/uL (ref 150.0–400.0)
RBC: 4.33 Mil/uL (ref 3.87–5.11)
RDW: 12.7 % (ref 11.5–15.5)
WBC: 5.9 10*3/uL (ref 4.0–10.5)

## 2020-10-05 LAB — TSH: TSH: 0.78 u[IU]/mL (ref 0.35–4.50)

## 2020-10-05 LAB — VITAMIN D 25 HYDROXY (VIT D DEFICIENCY, FRACTURES): VITD: 81.48 ng/mL (ref 30.00–100.00)

## 2020-10-05 LAB — HEMOGLOBIN A1C: Hgb A1c MFr Bld: 5.9 % (ref 4.6–6.5)

## 2020-10-05 LAB — VITAMIN B12: Vitamin B-12: 779 pg/mL (ref 211–911)

## 2020-10-05 MED ORDER — BUPROPION HCL ER (XL) 150 MG PO TB24: 2.0000 | ORAL_TABLET | Freq: Every day | ORAL | 1 refills | Status: DC

## 2020-10-05 MED ORDER — PANTOPRAZOLE SODIUM 40 MG PO TBEC: 1.0000 | DELAYED_RELEASE_TABLET | Freq: Every day | ORAL | 3 refills | Status: DC

## 2020-10-05 MED ORDER — ESCITALOPRAM OXALATE 20 MG PO TABS: 1.0000 | ORAL_TABLET | Freq: Every day | ORAL | 3 refills | Status: DC

## 2020-10-05 MED ORDER — IBUPROFEN 800 MG PO TABS: 800.0000 mg | ORAL_TABLET | Freq: Three times a day (TID) | ORAL | 1 refills | Status: DC | PRN

## 2020-10-05 NOTE — Progress Notes (Signed)
   Subjective:   Patient ID: Sheila [name], female    DOB: [birth year], 55 y.o.   MRN: [id number]  HPI The patient is a 55 YO female coming in new for several concerns including depression/anxiety (taking wellbutrin 150 mg and lexapro 20 mg daily, longstanding problem on meds since 20-30s, PA friend recommended something xr, denies SI/HI) and fatigue (since covid about 6 weeks ago, denies new numbness or tingling or weakness, prior vitamin D deficiency) and migraines (takes ibuprofen 800 mg prn and this has helped better than the migraine specific medications she has used in the past) and prediabetes (strong family history, diet could be better).   PMH, [facility], social history reviewed and updated  Review of Systems  Constitutional: Negative.   HENT: Negative.   Eyes: Negative.   Respiratory: Negative for cough, chest tightness and shortness of breath.   Cardiovascular: Negative for chest pain, palpitations and leg swelling.  Gastrointestinal: Negative for abdominal distention, abdominal pain, constipation, diarrhea, nausea and vomiting.  Musculoskeletal: Negative.   Skin: Negative.   Neurological: Positive for headaches.  Psychiatric/Behavioral: Positive for dysphoric mood. The patient is nervous/anxious.     Objective:  Physical Exam Constitutional:      Appearance: She is well-developed.  HENT:     Head: Normocephalic and atraumatic.  Cardiovascular:     Rate and Rhythm: Normal rate and regular rhythm.  Pulmonary:     Effort: Pulmonary effort is normal. No respiratory distress.     Breath sounds: Normal breath sounds. No wheezing or rales.  Abdominal:     General: Bowel sounds are normal. There is no distension.     Palpations: Abdomen is soft.     Tenderness: There is no abdominal tenderness. There is no rebound.  Musculoskeletal:     Cervical back: Normal range of motion.  Skin:    General: Skin is warm and dry.  Neurological:     Mental Status: She is alert and oriented  to person, place, and time.     Coordination: Coordination normal.     Vitals:   10/05/20 1000  BP: 108/70  Pulse: 60  Resp: 18  Temp: 98.1 F (36.7 C)  TempSrc: Oral  SpO2: 98%  Weight: 135 lb 3.2 oz (61.3 kg)  Height: 5' 2.5" (1.588 m)    This visit occurred during the SARS-CoV-2 public health emergency.  Safety protocols were in place, including screening questions prior to the visit, additional usage of staff PPE, and extensive cleaning of exam room while observing appropriate contact time as indicated for disinfecting solutions.   Assessment & Plan:

## 2020-10-05 NOTE — Patient Instructions (Addendum)
We will have you go up on the dose to 300 mg xl wellbutrin daily. Take 2 pills of wellbutrin daily and let us know in a couple of weeks if this is helping more.   We have sent in the refills of the medicines.

## 2020-10-06 DIAGNOSIS — R5383 Other fatigue: Secondary | ICD-10-CM | POA: Insufficient documentation

## 2020-10-06 LAB — LIPID PANEL
Cholesterol: 195 mg/dL (ref 0–200)
HDL: 51.8 mg/dL (ref >39.00)
LDL Cholesterol: 116 mg/dL — ABNORMAL HIGH (ref 0–99)
NonHDL: 143.38
Total CHOL/HDL Ratio: 4
Triglycerides: 139 mg/dL (ref 0.0–149.0)
VLDL: 27.8 mg/dL (ref 0.0–40.0)

## 2020-10-06 LAB — COMPREHENSIVE METABOLIC PANEL
ALT: 16 U/L (ref 0–35)
AST: 20 U/L (ref 0–37)
Albumin: 4.4 g/dL (ref 3.5–5.2)
Alkaline Phosphatase: 100 U/L (ref 39–117)
BUN: 18 mg/dL (ref 6–23)
CO2: 25 mEq/L (ref 19–32)
Calcium: 10.2 mg/dL (ref 8.4–10.5)
Chloride: 106 mEq/L (ref 96–112)
Creatinine, Ser: 0.79 mg/dL (ref 0.40–1.20)
GFR: 83.88 mL/min (ref >60.00)
Glucose, Bld: 90 mg/dL (ref 70–99)
Potassium: 4.1 mEq/L (ref 3.5–5.1)
Sodium: 144 mEq/L (ref 135–145)
Total Bilirubin: 0.3 mg/dL (ref 0.2–1.2)
Total Protein: 7.1 g/dL (ref 6.0–8.3)

## 2020-10-06 NOTE — Assessment & Plan Note (Signed)
Checking lipid panel as last 2020. Not on meds currently.

## 2020-10-06 NOTE — Assessment & Plan Note (Signed)
Checking HgA1c and adjust as needed. Offered nutritional counseling. Strong family history of diabetes.

## 2020-10-06 NOTE — Assessment & Plan Note (Signed)
She has this and in general same as usual. Up to date on colon cancer screening.

## 2020-10-06 NOTE — Assessment & Plan Note (Signed)
Refill ibuprofen 800 mg to use prn for migraines.

## 2020-10-06 NOTE — Assessment & Plan Note (Signed)
Sounds to be moderate and in control at this time. Some uncontrolled anxiety so we will increase wellbutrin to 300 mg daily and keep lexapro 20 mg daily. Follow up with [place] on how this change is doing.

## 2020-10-06 NOTE — Assessment & Plan Note (Signed)
Not well controlled recently. Taking lexapro 20 mg daily and increase dose of wellbutrin to 300 mg daily. Follow up on status.

## 2020-10-06 NOTE — Assessment & Plan Note (Signed)
Suspect post-covid and we talked about how this likely will improve. Checking CBC, CMP, B12, vitamin D, TSH, HgA1c to rule out metabolic cause. Treat as appropriate.

## 2020-10-06 NOTE — Assessment & Plan Note (Signed)
Refill protonix which she takes daily and is under good control with this. Will continue.

## 2020-10-17 IMAGING — MG DIGITAL SCREENING BILAT W/ TOMO W/ CAD
8 series · 8 of 24 positions shown · non-contrast
Comparison: Previous exam(s).

CLINICAL DATA: Screening.

EXAM:
DIGITAL SCREENING BILATERAL MAMMOGRAM WITH TOMO AND CAD

[L MLO synth-2D]
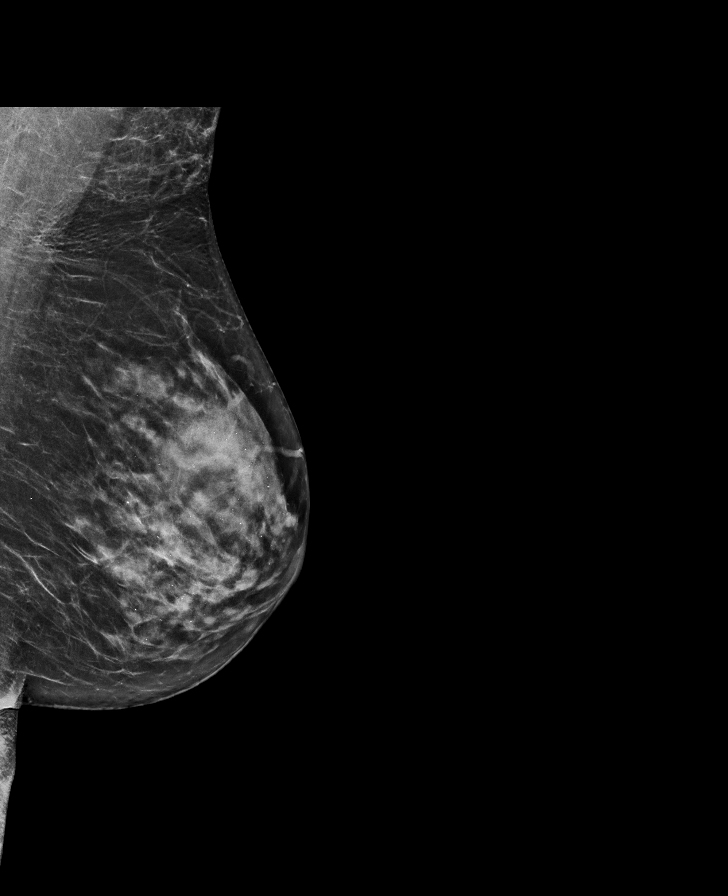

[R CC synth-2D]
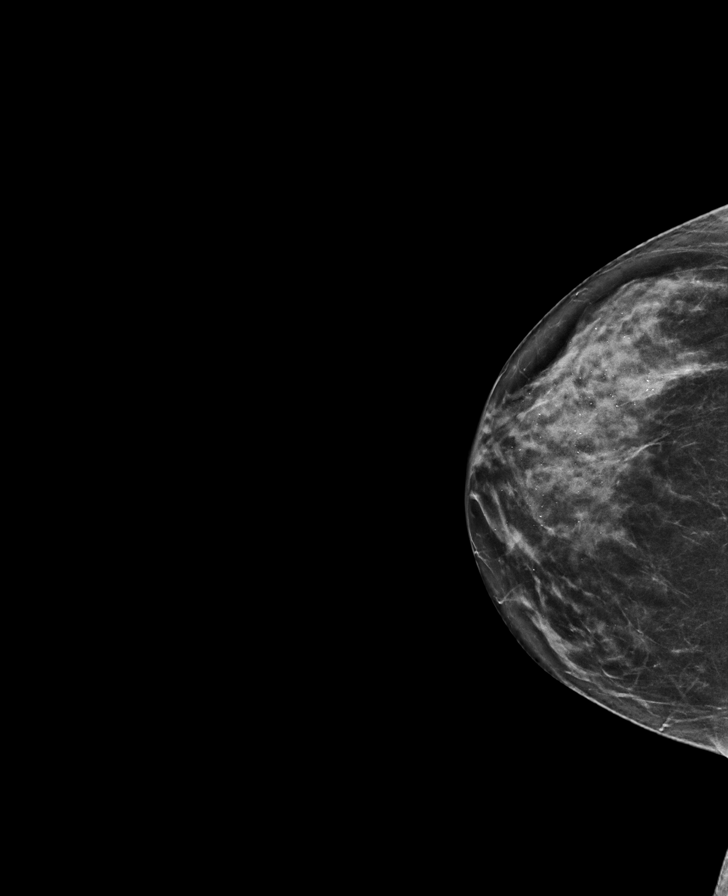

[R MLO synth-2D]
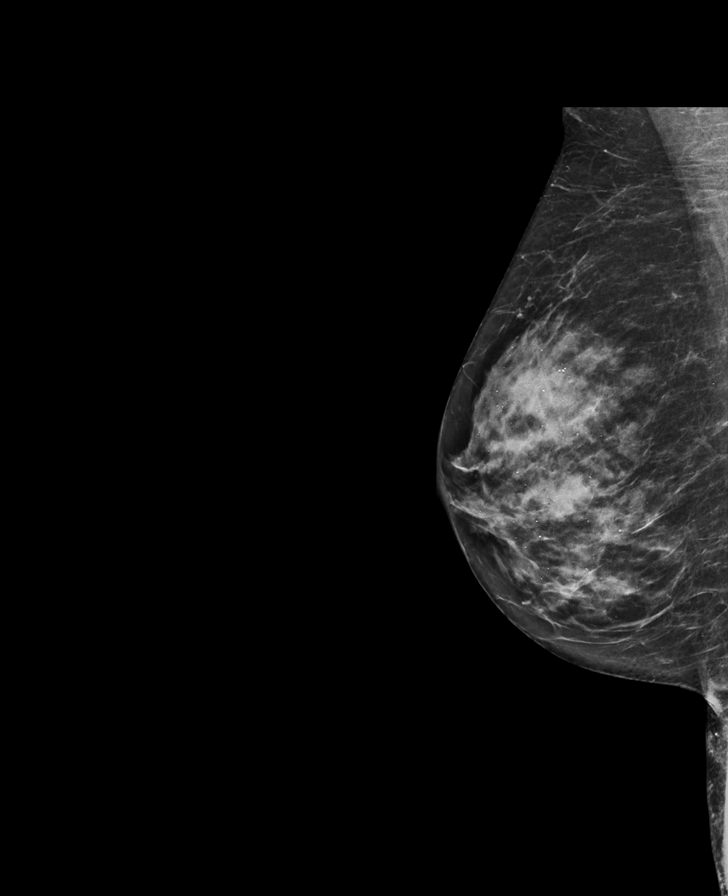

[L CC synth-2D]
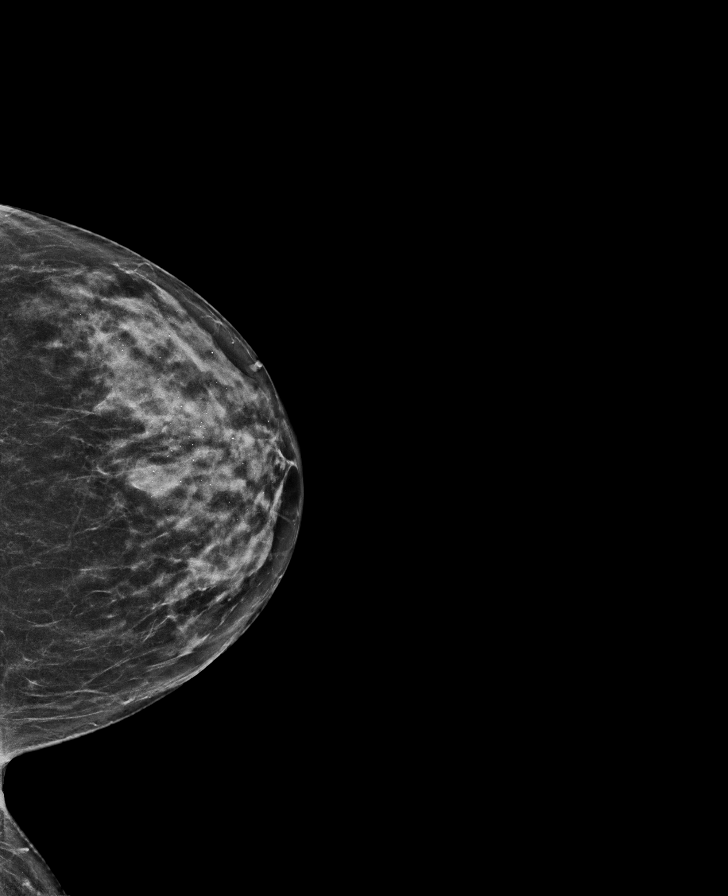

[R MLO tomo · tomo slice 33/64.0]
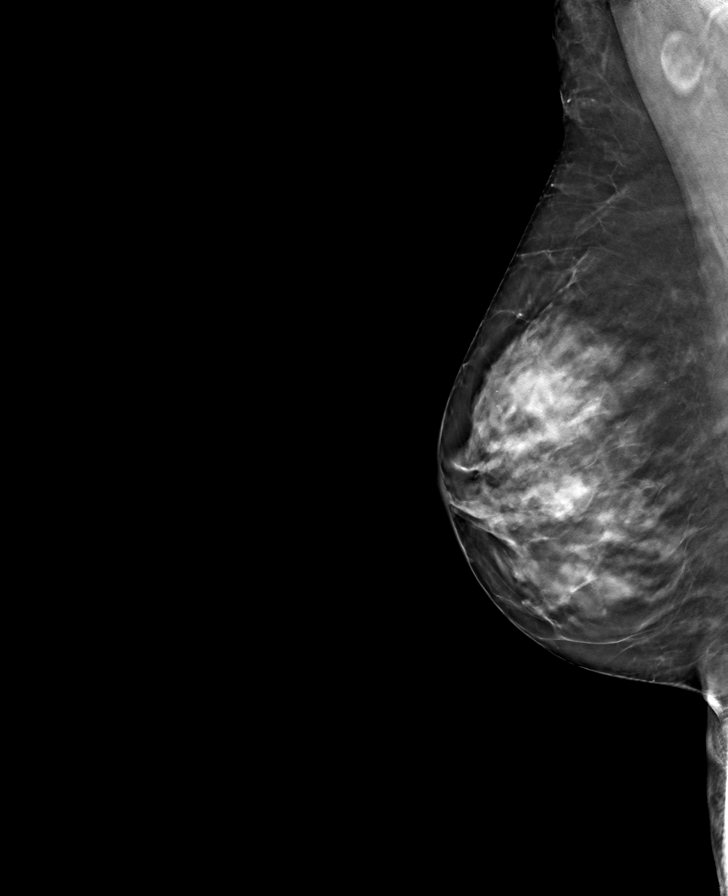

[L CC tomo · tomo slice 33/64.0]
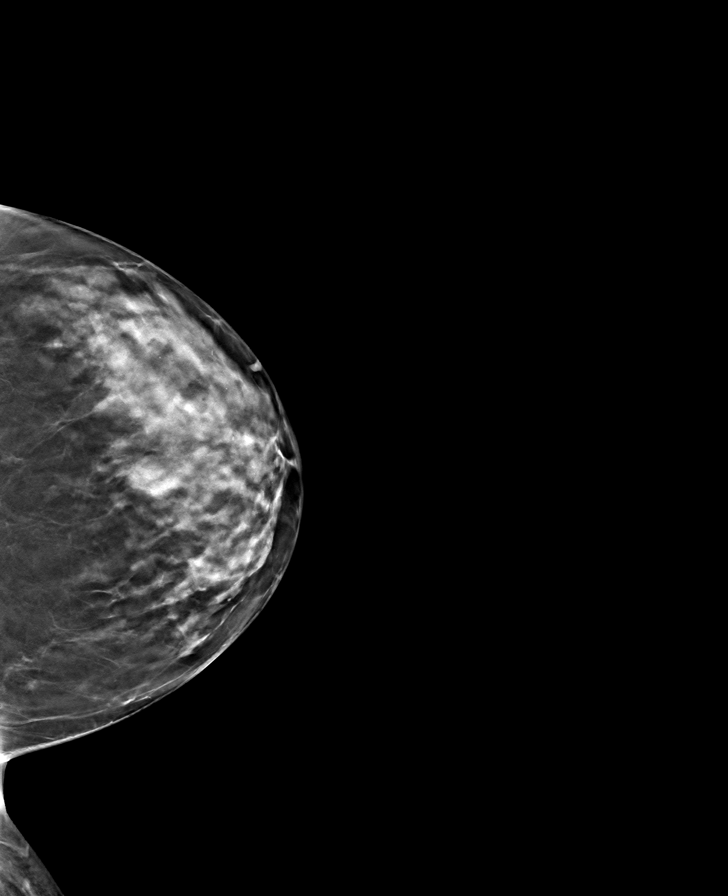

[L MLO tomo · tomo slice 35/69.0]
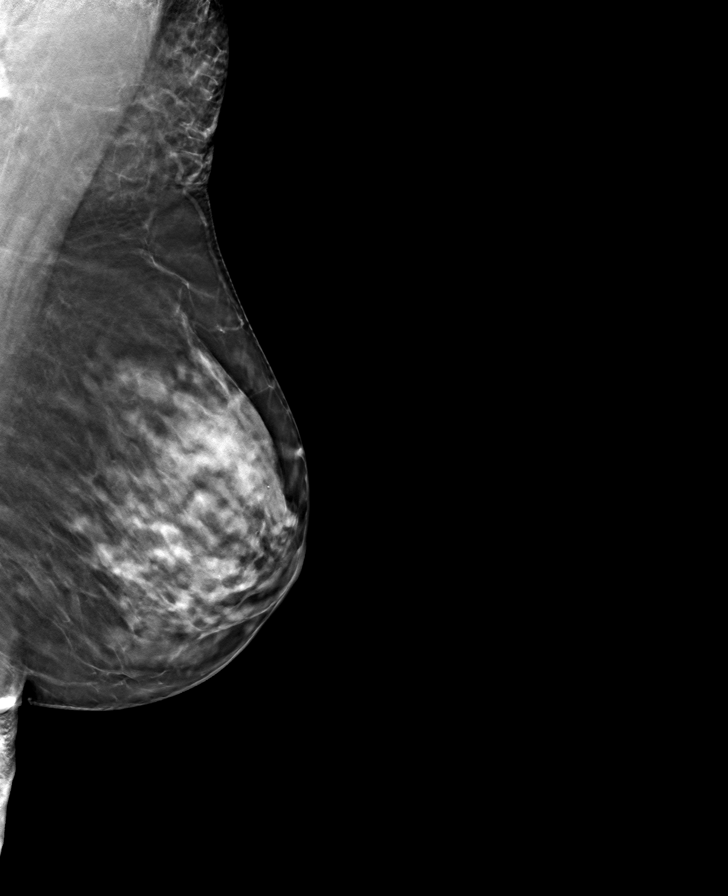

[R CC tomo · tomo slice 31/61.0]
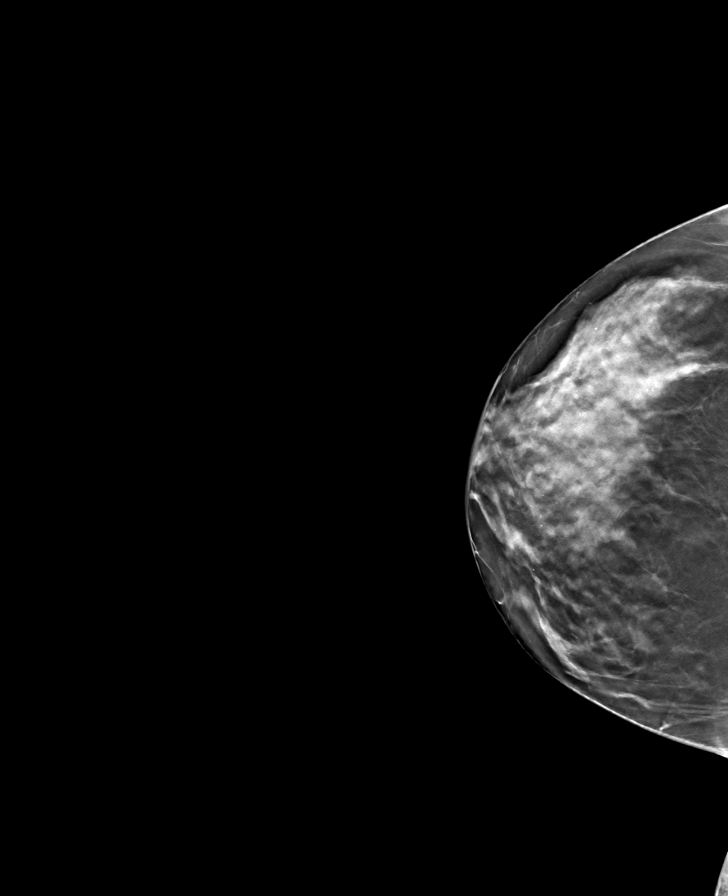

[8 of 24 positions shown; findings below may reference images not displayed]

ACR Breast Density Category c: The breast tissue is heterogeneously
dense, which may obscure small masses.
FINDINGS: There are no findings suspicious for malignancy. Images were
processed with CAD.
IMPRESSION: No mammographic evidence of malignancy. A result letter of this
screening mammogram will be mailed directly to the patient.

RECOMMENDATION:
Screening mammogram in one year. (Code:[id number])

BI-RADS CATEGORY  1: Negative.

## 2020-12-01 ENCOUNTER — Other Ambulatory Visit: Payer: Self-pay

## 2020-12-01 ENCOUNTER — Ambulatory Visit: Payer: BC Managed Care – PPO | Admitting: Family Medicine

## 2020-12-01 ENCOUNTER — Encounter: Payer: Self-pay | Admitting: Family Medicine

## 2020-12-01 ENCOUNTER — Other Ambulatory Visit (HOSPITAL_COMMUNITY)
Admission: RE | Admit: 2020-12-01 | Discharge: 2020-12-01 | Disposition: A | Discharge status: Home / Self Care | Payer: BC Managed Care – PPO | Source: Ambulatory Visit | Attending: Family Medicine | Admitting: Family Medicine

## 2020-12-01 ENCOUNTER — Other Ambulatory Visit: Payer: Self-pay | Admitting: Internal Medicine

## 2020-12-01 VITALS — BP 118/78 | HR 69 | Temp 98.2°F | Resp 19 | Ht 62.5 in | Wt 137.2 lb

## 2020-12-01 DIAGNOSIS — R8290 Unspecified abnormal findings in urine: Secondary | ICD-10-CM | POA: Diagnosis not present

## 2020-12-01 DIAGNOSIS — Z118 Encounter for screening for other infectious and parasitic diseases: Secondary | ICD-10-CM | POA: Diagnosis not present

## 2020-12-01 DIAGNOSIS — R399 Unspecified symptoms and signs involving the genitourinary system: Secondary | ICD-10-CM

## 2020-12-01 DIAGNOSIS — H9190 Unspecified hearing loss, unspecified ear: Secondary | ICD-10-CM

## 2020-12-01 DIAGNOSIS — Z87440 Personal history of urinary (tract) infections: Secondary | ICD-10-CM

## 2020-12-01 LAB — POCT URINALYSIS DIP (MANUAL ENTRY)
Blood, UA: NEGATIVE
Glucose, UA: NEGATIVE mg/dL
Ketones, POC UA: NEGATIVE mg/dL
Leukocytes, UA: NEGATIVE
Nitrite, UA: NEGATIVE
Protein Ur, POC: NEGATIVE mg/dL
Spec Grav, UA: >=1.03 — AB (ref 1.010–1.025)
Urobilinogen, UA: 0.2 E.U./dL
pH, UA: 6 (ref 5.0–8.0)

## 2020-12-01 MED ORDER — SULFAMETHOXAZOLE-TRIMETHOPRIM 800-160 MG PO TABS: 1.0000 | ORAL_TABLET | Freq: Two times a day (BID) | ORAL | 0 refills | Status: DC

## 2020-12-01 NOTE — Progress Notes (Signed)
Subjective:  Patient ID: Sheila [name], female    DOB: 09-28-1965  Age: 55 y.o. MRN: [id number]  CC:  Chief Complaint  Patient presents with   Cystitis    HPI Sheila [name] presents for   Possible cystitis?  Has noticed a change in smell of urine - past few days. Has been drinking water. Feels like she is hydrated. No burning, pain or change in urinary frequency. Chronic urgency. Taking D-mannose daily. Has not taken any abx- does not have septra at home.  No vaginal itching, burning or discharge. Using premarin cream.  No f/c, n/v/abd pain.  Self wet prep planned today.   Lab Results  Component Value Date   HGBA1C 5.9 10/05/2020    History of recurrent UTI per prior notes, stress incontinence, bladder prolapse, ureteral stone with hydronephrosis per problem list.  Has been followed by your gynecologist.  Treated with Macrobid after sexual intercourse, Bactrim for acute symptoms of UTI.  Refills given in February.  Urinalysis at that time with cloudy urine, urine culture with mixed genital flora isolated.  Note reviewed from January at her urogynecologist, Dr. Zigmund Daniel.  Recurrent UTIs likely related to [name] and possible pessary.  Prophylaxis with daily dosing of antibiotic was discussed, transvaginal estrogen therapy, d-mannose and cranberry supplements.  Plan for self start therapy with Bactrim and d-mannose for prevention.  Some difficulty with hearing past 2-3 years, has not had tested. No recent testing.   History Patient Active Problem List   Diagnosis Date Noted   Fatigue 10/06/2020   Snoring 03/23/2019   Periodic limb movements of sleep 03/23/2019   Sleep-disordered breathing 12/28/2018   Sleep apnea 12/20/2018   Ureteral stone with hydronephrosis 08/05/2018   Vaginal adhesions 07/28/2017   Female bladder prolapse 07/28/2017   Pre-diabetes 12/05/2015   IBS (irritable bowel syndrome) 06/15/2013   Migraine headache 02/21/2013   Depression 10/13/2012    GERD (gastroesophageal reflux disease) 03/23/2007   HYPERCHOLESTEROLEMIA, PURE 10/14/2006   GAD (generalized anxiety disorder) 10/13/2006   STRESS INCONTINENCE 10/13/2006   Past Medical History:  Diagnosis Date   Acne    Allergy    allergic rhinitis   Anxiety    Bladder stone 2018   Depression    Elevated hemoglobin A1c 2017   5.9   GERD (gastroesophageal reflux disease)    Hyperlipidemia    Kidney stone    Low serum vitamin D 2017   15   Past Surgical History:  Procedure Laterality Date   ABDOMINAL HYSTERECTOMY  07/11/2009    laparosccopically assisted vaginal hysterectomy with anterior and posterior colporrhaphy, transobturator Monarch sling, cystoscopy/ovaries remain   APPENDECTOMY     BLADDER SURGERY  07/2007   cystocele, pessary   bladder tack     2nd bladder surgery   CHOLECYSTECTOMY  2015   COLLAGEN INJECTION  07/07/2008   Periurethral injection - Dr. Matilde Sprang   CYST REMOVAL HAND     EXTRACORPOREAL SHOCK WAVE LITHOTRIPSY Right 08/06/2018   Procedure: EXTRACORPOREAL SHOCK WAVE LITHOTRIPSY (ESWL);  Surgeon: Billey Co, MD;  Location: [facility] ORS;  Service: Urology;  Laterality: Right;   INCONTINENCE SURGERY  08/04/2007   Spark miduethral sling, cystoscopy - Dr. Matilde Sprang   NASAL SINUS SURGERY  09/2015   Dr.Byers   No Known Allergies Prior to Admission medications   Medication Sig Start Date End Date Taking? Authorizing Provider  buPROPion (WELLBUTRIN XL) 150 MG 24 hr tablet Take 2 tablets (300 mg total) by mouth daily. 10/05/20  Yes Crawford,  Real Cons, MD  cholecalciferol (VITAMIN D3) 25 MCG (1000 UT) tablet Take 1,000 Units by mouth daily.   Yes [provider]  conjugated estrogens (PREMARIN) vaginal cream Place 1/2 gram per vagina at hs 2 times per week. 12/13/19  Yes Amundson Raliegh Ip, MD  escitalopram (LEXAPRO) 20 MG tablet Take 1 tablet (20 mg total) by mouth daily. 10/05/20  Yes Hoyt Koch, MD  ibuprofen (ADVIL) 800 MG tablet Take  1 tablet (800 mg total) by mouth every 8 (eight) hours as needed. 10/05/20  Yes Hoyt Koch, MD  pantoprazole (PROTONIX) 40 MG tablet Take 1 tablet (40 mg total) by mouth daily. 10/05/20  Yes Hoyt Koch, MD   Social History   Socioeconomic History   Marital status: Married    Spouse name: Richard "Darren"   Number of children: Not on file   Years of education: Not on file   Highest education level: Not on file  Occupational History    Comment: retired  Tobacco Use   Smoking status: Never   Smokeless tobacco: Never  Vaping Use   Vaping Use: Never used  Substance and Sexual Activity   Alcohol use: No    Alcohol/week: 0.0 standard drinks   Drug use: No   Sexual activity: Yes    Partners: Male    Birth control/protection: Surgical    Comment: TVH--ovaries remain  Other Topics Concern   Not on file  Social History Narrative   Lives with husband   Caffeine use: 24-32 oz per day    [place]   2 kids   Married 1989   Social Determinants of Health   Financial Resource Strain: Not on file  Food Insecurity: Not on file  Transportation Needs: Not on file  Physical Activity: Not on file  Stress: Not on file  Social Connections: Not on file  Intimate Partner Violence: Not on file    Review of Systems Per HPI.   Objective:   Vitals:   12/01/20 1117  BP: 118/78  Pulse: 69  Resp: 19  Temp: 98.2 F (36.8 C)  TempSrc: Temporal  SpO2: 98%  Weight: 137 lb 3.2 oz (62.2 kg)  Height: 5' 2.5" (1.588 m)    Physical Exam Constitutional:      Appearance: Normal appearance. She is well-developed.  HENT:     Head: Normocephalic and atraumatic.     Right Ear: Tympanic membrane, ear canal and external ear normal. There is no impacted cerumen.     Left Ear: Tympanic membrane, ear canal and external ear normal. There is no impacted cerumen.  Pulmonary:     Effort: Pulmonary effort is normal.  Abdominal:     General: There is no  distension.     Palpations: Abdomen is soft.     Tenderness: There is no abdominal tenderness. There is no right CVA tenderness, left CVA tenderness, guarding or rebound.  Skin:    General: Skin is warm.  Neurological:     Mental Status: She is alert and oriented to person, place, and time.  Psychiatric:        Behavior: Behavior normal.     Results for orders placed or performed in visit on 12/01/20  POCT urinalysis dipstick  Result Value Ref Range   Color, UA yellow yellow   Clarity, UA clear clear   Glucose, UA negative negative mg/dL   Bilirubin, UA small (A) negative   Ketones, POC UA negative negative mg/dL  Spec Grav, UA >=1.030 (A) 1.010 - 1.025   Blood, UA negative negative   pH, UA 6.0 5.0 - 8.0   Protein Ur, POC negative negative mg/dL   Urobilinogen, UA 0.2 0.2 or 1.0 E.U./dL   Nitrite, UA Negative Negative   Leukocytes, UA Negative Negative    Assessment & Plan:  Sheila [name] is a 55 y.o. female . Abnormal urine odor - Plan: Urine Culture, Urine cytology ancillary only UTI symptoms - Plan: POCT urinalysis dipstick, Urine Culture, Urine cytology ancillary only History of UTI - Plan: sulfamethoxazole-trimethoprim (BACTRIM DS) 800-160 MG tablet  -Reassuring in office urinalysis, history of prediabetes but no glucose on urinalysis, unlikely cause.  Abnormal urine odor symptoms, unlikely cystitis.  Certainly could be food related.  Although she has had history of recurrent UTI, other urologic history as above.  -Check urine culture, urine cytology to evaluate for Candida, BV.  -Increase fluids as concentrated urinalysis in office.  -Bactrim prescribed on hold if she has more of her typical UTI symptoms but would wait on that at this time.  RTC precautions.   Hearing loss, unspecified hearing loss type, unspecified laterality - Plan: Ambulatory referral to ENT  -Longstanding symptoms, previous patient and ENT.  Likely will have audiology eval, referral  ordered  Meds ordered this encounter  Medications   sulfamethoxazole-trimethoprim (BACTRIM DS) 800-160 MG tablet    Sig: Take 1 tablet by mouth 2 (two) times daily.    Dispense:  6 tablet    Refill:  0    Ok to place on hold.   Patient Instructions  Make sure to drink plenty of water. Goal of 64 ounces per day, but I know that may worsen some of your chronic urinary symptoms.  If any concerns on urine culture or other urine test I will let you know.  If burning with urination or worsening symptoms typical of prior urine infections, bactrim was sent to your pharmacy. Change in odor is less likely infection based on tests today.   Return to the clinic or go to the nearest emergency room if any of your symptoms worsen or new symptoms occur.    Signed,   Merri Ray, MD [place] 12/01/20 12:06 PM

## 2020-12-01 NOTE — Patient Instructions (Addendum)
Make sure to drink plenty of water. Goal of 64 ounces per day, but I know that may worsen some of your chronic urinary symptoms.  If any concerns on urine culture or other urine test I will let you know.  If burning with urination or worsening symptoms typical of prior urine infections, bactrim was sent to your pharmacy. Change in odor is less likely infection based on tests today.   Return to the clinic or go to the nearest emergency room if any of your symptoms worsen or new symptoms occur.

## 2020-12-02 LAB — URINE CULTURE
MICRO NUMBER:: 12180530
SPECIMEN QUALITY:: ADEQUATE

## 2020-12-04 LAB — URINE CYTOLOGY ANCILLARY ONLY
Bacterial Vaginitis-Urine: NEGATIVE
Candida Urine: NEGATIVE
Chlamydia: NEGATIVE
Comment: NEGATIVE
Comment: NEGATIVE
Comment: NORMAL
Neisseria Gonorrhea: NEGATIVE
Trichomonas: NEGATIVE

## 2020-12-18 ENCOUNTER — Other Ambulatory Visit: Payer: Self-pay

## 2020-12-18 ENCOUNTER — Encounter: Payer: Self-pay | Admitting: Obstetrics and Gynecology

## 2020-12-18 ENCOUNTER — Ambulatory Visit (INDEPENDENT_AMBULATORY_CARE_PROVIDER_SITE_OTHER): Payer: BC Managed Care – PPO | Admitting: Obstetrics and Gynecology

## 2020-12-18 ENCOUNTER — Other Ambulatory Visit: Payer: Self-pay | Admitting: Obstetrics and Gynecology

## 2020-12-18 VITALS — BP 100/62 | HR 62 | Ht 62.25 in | Wt 135.0 lb

## 2020-12-18 DIAGNOSIS — Z01419 Encounter for gynecological examination (general) (routine) without abnormal findings: Secondary | ICD-10-CM

## 2020-12-18 DIAGNOSIS — Z4689 Encounter for fitting and adjustment of other specified devices: Secondary | ICD-10-CM

## 2020-12-18 MED ORDER — PREMARIN 0.625 MG/GM VA CREA: TOPICAL_CREAM | VAGINAL | 2 refills | Status: DC

## 2020-12-18 MED ORDER — NITROFURANTOIN MONOHYD MACRO 100 MG PO CAPS: 100.0000 mg | ORAL_CAPSULE | Freq: Two times a day (BID) | ORAL | 2 refills | Status: DC

## 2020-12-18 NOTE — Patient Instructions (Signed)

## 2020-12-18 NOTE — Progress Notes (Signed)
55 y.o. G72P2002 Married Caucasian female here for annual exam.    Doing better with her bladder infections.  She takes Nitrofurantoin for post coital UTI prevention from Dr. Zigmund Daniel.  She needs a refill of this.  Also taking D Mannose daily. Using vaginal estrogen cream.  Using her pessary daily.  Comfortable and it is working well.   A1C is 5.9. Doing Weight Watcher off and on.   PCP:  Pricilla Holm, MD   Patient's last menstrual period was 05/06/2009.           Sexually active: No.  The current method of family planning is status post hysterectomy.    Exercising: No.   Some walking--Doing weight watchers Smoker:  no  Health Maintenance: Pap:  08-01-14 Neg:Neg HR HPV, 09-27-10 Neg History of abnormal Pap:  yes, Hx LEEP 2006 MMG: 01-26-20 3D/Neg/BiRads1 Colonoscopy: 07/16/17 Normal f/u 10 years BMD: n/a  Result  n/a TDaP:  2016 Gardasil:   no HIV:Neg in preg Hep C:no Screening Labs:  PCP.    reports that she has never smoked. She has never used smokeless tobacco. She reports that she does not drink alcohol and does not use drugs.  Past Medical History:  Diagnosis Date   Acne    Allergy    allergic rhinitis   Anxiety    Bladder stone 2018   Depression    Elevated hemoglobin A1c 2017   5.9   GERD (gastroesophageal reflux disease)    Hyperlipidemia    Kidney stone    Low serum vitamin D 2017   15    Past Surgical History:  Procedure Laterality Date   ABDOMINAL HYSTERECTOMY  07/11/2009    laparosccopically assisted vaginal hysterectomy with anterior and posterior colporrhaphy, transobturator Monarch sling, cystoscopy/ovaries remain   APPENDECTOMY     BLADDER SURGERY  07/2007   cystocele, pessary   bladder tack     2nd bladder surgery   CHOLECYSTECTOMY  2015   COLLAGEN INJECTION  07/07/2008   Periurethral injection - Dr. Matilde Sprang   CYST REMOVAL HAND     EXTRACORPOREAL SHOCK WAVE LITHOTRIPSY Right 08/06/2018   Procedure: EXTRACORPOREAL SHOCK WAVE  LITHOTRIPSY (ESWL);  Surgeon: Billey Co, MD;  Location: [facility] ORS;  Service: Urology;  Laterality: Right;   INCONTINENCE SURGERY  08/04/2007   Spark miduethral sling, cystoscopy - Dr. Matilde Sprang   NASAL SINUS SURGERY  09/2015   Dr.Byers    Current Outpatient Medications  Medication Sig Dispense Refill   buPROPion (WELLBUTRIN XL) 150 MG 24 hr tablet Take 2 tablets (300 mg total) by mouth daily. 180 tablet 1   cholecalciferol (VITAMIN D3) 25 MCG (1000 UT) tablet Take 1,000 Units by mouth daily.     conjugated estrogens (PREMARIN) vaginal cream Place 1/2 gram per vagina at hs 2 times per week. 30 g 2   escitalopram (LEXAPRO) 20 MG tablet Take 1 tablet (20 mg total) by mouth daily. 90 tablet 3   ibuprofen (ADVIL) 800 MG tablet TAKE 1 TABLET BY MOUTH EVERY 8 HOURS AS NEEDED 90 tablet 1   pantoprazole (PROTONIX) 40 MG tablet Take 1 tablet (40 mg total) by mouth daily. 90 tablet 3   No current facility-administered medications for this visit.    Family History  Problem Relation Age of Onset   Diabetes Father    Diabetes Maternal Grandmother    Uterine cancer Paternal Aunt    Breast cancer Neg Hx    Colon cancer Neg Hx     Review of Systems  All other systems reviewed and are negative.  Exam:   BP 100/62   Pulse 62   Ht 5' 2.25" (1.581 m)   Wt 135 lb (61.2 kg)   LMP 05/06/2009   SpO2 99%   BMI 24.49 kg/m     General appearance: alert, cooperative and appears stated age Head: normocephalic, without obvious abnormality, atraumatic Neck: no adenopathy, supple, symmetrical, trachea midline and thyroid normal to inspection and palpation Lungs: clear to auscultation bilaterally Breasts: normal appearance, no masses or tenderness, No nipple retraction or dimpling, No nipple discharge or bleeding, No axillary adenopathy Heart: regular rate and rhythm Abdomen: soft, non-tender; no masses, no organomegaly Extremities: extremities normal, atraumatic, no cyanosis or edema Skin: skin  color, texture, turgor normal. No rashes or lesions Lymph nodes: cervical, supraclavicular, and axillary nodes normal. Neurologic: grossly normal  Pelvic: External genitalia:  no lesions              No abnormal inguinal nodes palpated.              Urethra:  normal appearing urethra with no masses, tenderness or lesions              Bartholins and Skenes: normal                 Vagina: normal appearing vagina with normal color and discharge, no lesions.  Support looks good today.   No real cystocele with Valsalva.              Cervix: absent              Pap taken: no Bimanual Exam:  Uterus:  absent              Adnexa: no mass, fullness, tenderness              Rectal exam: yes.  Confirms.              Anus:  normal sphincter tone, no lesions  Pessary ring with support removed, cleansed, and replaced.   Chaperone was present for exam:  Estill Bamberg, CMA.   Assessment:   Well woman visit with gynecologic exam. Cystocele. Treated with pessary.  Vaginal atrophy.  Improved with vaginal estrogen cream.  Urinary frequency.  Hx recurrent UTIs.  Renal stones.  Hx elevated hemoglobin A1C.    Hx hyperlipidemia. Hx depression and anxiety.  Chronic HA.   Plan: Mammogram screening discussed.  She will schedule.  Self breast awareness reviewed. Pap and HR HPV as above. Guidelines for Calcium, Vitamin D, regular exercise program including cardiovascular and weight bearing exercise. Rx for Premarin cream and for Nitrofurantoin.  I did discuss the potential effect on breast cancer.  Continue pessary care.  Follow up annually and prn.    After visit summary provided.

## 2020-12-19 ENCOUNTER — Telehealth: Payer: Self-pay | Admitting: *Deleted

## 2020-12-19 ENCOUNTER — Other Ambulatory Visit: Payer: Self-pay

## 2020-12-19 ENCOUNTER — Telehealth: Payer: Self-pay

## 2020-12-19 MED ORDER — ESTRADIOL 0.1 MG/GM VA CREA: TOPICAL_CREAM | VAGINAL | 1 refills | Status: DC

## 2020-12-19 NOTE — Telephone Encounter (Signed)
Opened in error

## 2020-12-19 NOTE — Telephone Encounter (Signed)
Patient would like an alternative to premarin vaginal cream. Cost is to expensive. I will route this to Provider for recommednations

## 2020-12-19 NOTE — Telephone Encounter (Signed)
Pt aware of change.

## 2020-12-19 NOTE — Telephone Encounter (Signed)
I switched her prescription from Premarin vaginal cream to estradiol vaginal cream, and I sent it to her pharmacy.  She will use 1/2 gram per vagina at hs 2 - 3 times per week.

## 2020-12-22 ENCOUNTER — Other Ambulatory Visit: Payer: Self-pay | Admitting: Internal Medicine

## 2020-12-22 DIAGNOSIS — Z1231 Encounter for screening mammogram for malignant neoplasm of breast: Secondary | ICD-10-CM

## 2021-01-15 ENCOUNTER — Ambulatory Visit: Payer: BC Managed Care – PPO | Admitting: Nurse Practitioner

## 2021-01-15 ENCOUNTER — Other Ambulatory Visit: Payer: Self-pay

## 2021-01-15 ENCOUNTER — Encounter: Payer: Self-pay | Admitting: Nurse Practitioner

## 2021-01-15 VITALS — BP 118/76

## 2021-01-15 DIAGNOSIS — R079 Chest pain, unspecified: Secondary | ICD-10-CM

## 2021-01-15 NOTE — Progress Notes (Signed)
   Acute Office Visit  Subjective:    Patient ID: Sheila [name], female    DOB: 09/20/1965, 55 y.o.   MRN: [id number]   HPI 55 y.o. presents today for left sided pain that started about a week ago. It came on suddenly and is worse with certain movements/positions and deep breathing. She had pain putting on her bra today. Normal mammogram history. Most recent 01/27/2020. She is scheduled for mammogram 01/31/2021.    Review of Systems  Constitutional: Negative.   Left breast: Positive for pain. Negative for mass, nipple discharge, redness, inverted nipple, or swelling. Right: Negative.     Objective:    Physical Exam Constitutional:      Appearance: Normal appearance.  Chest:  Breasts:    Right: Normal.     Left: Tenderness (lower breast/rib) present. No swelling, inverted nipple, mass, nipple discharge or skin change.  Lymphadenopathy:     Upper Body:     Left upper body: No supraclavicular or axillary adenopathy.    BP 118/76   LMP 05/06/2009  Wt Readings from Last 3 Encounters:  12/18/20 135 lb (61.2 kg)  12/01/20 137 lb 3.2 oz (62.2 kg)  10/05/20 135 lb 3.2 oz (61.3 kg)        Assessment & Plan:   Problem List Items Addressed This Visit   None Visit Diagnoses     Left-sided chest pain    -  Primary      Plan: Pain likely musculoskeletal in nature. Recommend rest, warmth, and Ibuprofen. If pain continues up to scheduled screening mammogram she will let us know so we can order breast ultrasound. She is agreeable to plan.      Tamela Gammon DNP, 4:35 PM 01/15/2021

## 2021-01-26 ENCOUNTER — Other Ambulatory Visit: Payer: Self-pay | Admitting: Internal Medicine

## 2021-01-29 ENCOUNTER — Ambulatory Visit
Admission: RE | Admit: 2021-01-29 | Discharge: 2021-01-29 | Disposition: A | Discharge status: Home / Self Care | Payer: BC Managed Care – PPO | Source: Ambulatory Visit | Attending: Internal Medicine | Admitting: Internal Medicine

## 2021-01-29 ENCOUNTER — Other Ambulatory Visit: Payer: Self-pay

## 2021-01-29 DIAGNOSIS — Z1231 Encounter for screening mammogram for malignant neoplasm of breast: Secondary | ICD-10-CM

## 2021-01-31 ENCOUNTER — Ambulatory Visit: Payer: BC Managed Care – PPO | Admitting: Dermatology

## 2021-03-28 ENCOUNTER — Other Ambulatory Visit: Payer: Self-pay | Admitting: Internal Medicine

## 2021-05-02 ENCOUNTER — Other Ambulatory Visit: Payer: Self-pay

## 2021-05-02 ENCOUNTER — Ambulatory Visit: Payer: BC Managed Care – PPO | Admitting: Dermatology

## 2021-05-02 DIAGNOSIS — D229 Melanocytic nevi, unspecified: Secondary | ICD-10-CM

## 2021-05-02 DIAGNOSIS — Z1283 Encounter for screening for malignant neoplasm of skin: Secondary | ICD-10-CM

## 2021-05-02 DIAGNOSIS — L565 Disseminated superficial actinic porokeratosis (DSAP): Secondary | ICD-10-CM | POA: Diagnosis not present

## 2021-05-02 DIAGNOSIS — L821 Other seborrheic keratosis: Secondary | ICD-10-CM

## 2021-05-02 DIAGNOSIS — L578 Other skin changes due to chronic exposure to nonionizing radiation: Secondary | ICD-10-CM

## 2021-05-02 DIAGNOSIS — L820 Inflamed seborrheic keratosis: Secondary | ICD-10-CM

## 2021-05-02 DIAGNOSIS — D1800 Hemangioma unspecified site: Secondary | ICD-10-CM

## 2021-05-02 DIAGNOSIS — L814 Other melanin hyperpigmentation: Secondary | ICD-10-CM

## 2021-05-02 NOTE — Progress Notes (Signed)
Follow-Up Visit   Subjective  Sheila [name] is a 55 y.o. female who presents for the following: Annual Exam (Mole check ). Pt has a history of Dermatitis, disseminated superficial actinic porokeratosis (DSAP).   She has many itchy/irritated spots.  The patient presents for Total-Body Skin Exam (TBSE) for skin cancer screening and mole check.  The patient has spots, moles and lesions to be evaluated, some may be new or changing and the patient has concerns that these could be cancer.    The following portions of the chart were reviewed this encounter and updated as appropriate:   Tobacco   Allergies   Meds   Problems   Med Hx   Surg Hx   Fam Hx       Review of Systems:  No other skin or systemic complaints except as noted in HPI or Assessment and Plan.  Objective  Well appearing patient in no apparent distress; mood and affect are within normal limits.  A full examination was performed including scalp, head, eyes, ears, nose, lips, neck, chest, axillae, abdomen, back, buttocks, bilateral upper extremities, bilateral lower extremities, hands, feet, fingers, toes, fingernails, and toenails. All findings within normal limits unless otherwise noted below.  left forearm x 6, left clavicle x 1, mid chest x 1, left leg x 18, right leg x 20, right arm x 7, right cheek x 1  (54) (54) Stuck-on, waxy, tan-brown papules  arms, legs, chest, back Thin scaly pink papules    Assessment & Plan  Inflamed seborrheic keratosis (54) left forearm x 6, left clavicle x 1, mid chest x 1, left leg x 18, right leg x 20, right arm x 7, right cheek x 1  (54)  Symptomatic, patient marked the ones that itch  Prior to procedure, discussed risks of blister formation, small wound, skin dyspigmentation, or rare scar following cryotherapy. Recommend Vaseline ointment to treated areas while healing.    Destruction of lesion - left forearm x 6, left clavicle x 1, mid chest x 1, left leg x 18, right leg x  20, right arm x 7, right cheek x 1  (54) Complexity: simple   Destruction method: cryotherapy   Informed consent: discussed and consent obtained   Timeout:  patient name, date of birth, surgical site, and procedure verified Lesion destroyed using liquid nitrogen: Yes   Region frozen until ice ball extended beyond lesion: Yes   Outcome: patient tolerated procedure well with no complications   Post-procedure details: wound care instructions given    DSAP (disseminated superficial actinic porokeratosis) arms, legs, chest, back  Chronic condition with duration over one year. Currently well-controlled.   Continue Prescription Skin Medicinals Cholesterol-Lovastatin Ointment twice daily as needed.  Monitor for any changing lesions.   Lentigines - Scattered tan macules - Due to sun exposure - Benign-appearing, observe - Recommend daily broad spectrum sunscreen SPF 30+ to sun-exposed areas, reapply every 2 hours as needed. - Call for any changes  Seborrheic Keratoses - Stuck-on, waxy, tan-brown papules and/or plaques  - Benign-appearing - Discussed benign etiology and prognosis. - Observe - Call for any changes - Start otc Ammonium lactate apply to rough dry skin daily as needed  Melanocytic Nevi - Tan-brown and/or pink-flesh-colored symmetric macules and papules - Benign appearing on exam today - Observation - Call clinic for new or changing moles - Recommend daily use of broad spectrum spf 30+ sunscreen to sun-exposed areas.   Hemangiomas - Red papules - Discussed benign nature - Observe -  Call for any changes  Actinic Damage - Chronic condition, secondary to cumulative UV/sun exposure - diffuse scaly erythematous macules with underlying dyspigmentation - Recommend daily broad spectrum sunscreen SPF 30+ to sun-exposed areas, reapply every 2 hours as needed.  - Staying in the shade or wearing long sleeves, sun glasses (UVA+UVB protection) and wide brim hats (4-inch brim  around the entire circumference of the hat) are also recommended for sun protection.  - Call for new or changing lesions.  Skin cancer screening performed today.   Return in about 1 year (around 05/02/2022) for TBSE .  I, Marye Round, CMA, am acting as scribe for Forest Gleason, MD .   Documentation: I have reviewed the above documentation for accuracy and completeness, and I agree with the above.  Forest Gleason, MD

## 2021-05-02 NOTE — Patient Instructions (Addendum)
Recommend daily broad spectrum sunscreen SPF 30+ to sun-exposed areas, reapply every 2 hours as needed. Call for new or changing lesions.  Staying in the shade or wearing long sleeves, sun glasses (UVA+UVB protection) and wide brim hats (4-inch brim around the entire circumference of the hat) are also recommended for sun protection.      Recommend taking Heliocare sun protection supplement daily in sunny weather for additional sun protection. For maximum protection on the sunniest days, you can take up to 2 capsules of regular Heliocare OR take 1 capsule of Heliocare Ultra. For prolonged exposure (such as a full day in the sun), you can repeat your dose of the supplement 4 hours after your first dose. Heliocare can be purchased at [facility] or at [email or web address].      Start over the counter Ammonium lactate cream to dry rough places on the skin.   Prior to procedure, discussed risks of blister formation, small wound, skin dyspigmentation, or rare scar following cryotherapy. Recommend Vaseline ointment to treated areas while healing.     If You Need Anything After Your Visit  If you have any questions or concerns for your doctor, please call our main line at [phone] and press option 4 to reach your doctor's medical assistant. If no one answers, please leave a voicemail as directed and we will return your call as soon as possible. Messages left after 4 pm will be answered the following business day.   You may also send [place] a message via [place]. We typically respond to MyChart messages within 1-2 business days.  For prescription refills, please ask your pharmacy to contact our office. Our fax number is [phone].  If you have an urgent issue when the clinic is closed that cannot wait until the next business day, you can page your doctor at the number below.    Please note that while we do our best to be available for urgent issues outside of office hours, we are not  available 24/7.   If you have an urgent issue and are unable to reach [place], you may choose to seek medical care at your doctor's office, retail clinic, urgent care center, or emergency room.  If you have a medical emergency, please immediately call 911 or go to the emergency department.  Pager Numbers  - Dr. Nehemiah Massed: [phone]  - Dr. Laurence Ferrari: [phone]  - Dr. Nicole Kindred: [phone]  In the event of inclement weather, please call our main line at [phone] for an update on the status of any delays or closures.  Dermatology Medication Tips: Please keep the boxes that topical medications come in in order to help keep track of the instructions about where and how to use these. Pharmacies typically print the medication instructions only on the boxes and not directly on the medication tubes.   If your medication is too expensive, please contact our office at [phone] option 4 or send [place] a message through [place].   We are unable to tell what your co-pay for medications will be in advance as this is different depending on your insurance coverage. However, we may be able to find a substitute medication at lower cost or fill out paperwork to get insurance to cover a needed medication.   If a prior authorization is required to get your medication covered by your insurance company, please allow [place] 1-2 business days to complete this process.  Drug prices often vary depending on where the prescription is filled and some pharmacies may offer  cheaper prices.  The website www.goodrx.com contains coupons for medications through different pharmacies. The prices here do not account for what the cost may be with help from insurance (it may be cheaper with your insurance), but the website can give you the price if you did not use any insurance.  - You can print the associated coupon and take it with your prescription to the pharmacy.  - You may also stop by our office during regular business hours  and pick up a GoodRx coupon card.  - If you need your prescription sent electronically to a different pharmacy, notify our office through [facility] or by phone at [phone] option 4.     Si Usted Necesita Algo Despus de Su Visita  Tambin puede enviarnos un mensaje a travs de Pharmacist, community. Por lo general respondemos a los mensajes de MyChart en el transcurso de 1 a 2 das hbiles.  Para renovar recetas, por favor pida a su farmacia que se ponga en contacto con nuestra oficina. Harland Dingwall de fax es [place] [phone].  Si tiene un asunto urgente cuando la clnica est cerrada y que no puede esperar hasta el siguiente da hbil, puede llamar/localizar a su doctor(a) al nmero que aparece a continuacin.   Por favor, tenga en cuenta que aunque hacemos todo lo posible para estar disponibles para asuntos urgentes fuera del horario de [place], no estamos disponibles las 24 horas del da, los 7 das de la [place].   Si tiene un problema urgente y no puede comunicarse con nosotros, puede optar por buscar atencin mdica  en el consultorio de su doctor(a), en una clnica privada, en un centro de atencin urgente o en una sala de emergencias.  Si tiene Engineering geologist, por favor llame inmediatamente al 911 o vaya a la sala de emergencias.  Nmeros de bper  - Dr. Nehemiah Massed: [phone]  - Dra. Moye: [phone]  - Dra. Nicole Kindred: [phone]  En caso de inclemencias del [place], por favor llame a Johnsie Kindred principal al [phone] para una actualizacin sobre el [place] de cualquier retraso o cierre.  Consejos para la medicacin en dermatologa: Por favor, guarde las cajas en las que vienen los medicamentos de uso tpico para ayudarle a seguir las instrucciones sobre dnde y cmo usarlos. Las farmacias generalmente imprimen las instrucciones del medicamento slo en las cajas y no directamente en los tubos del [place].   Si su medicamento es muy caro, por favor, pngase  en contacto con Zigmund Daniel llamando al [phone] y presione la opcin 4 o envenos un mensaje a travs de Pharmacist, community.   No podemos decirle cul ser su copago por los medicamentos por adelantado ya que esto es diferente dependiendo de la cobertura de su seguro. Sin embargo, es posible que podamos encontrar un medicamento sustituto a Electrical engineer un formulario para que el seguro cubra el medicamento que se considera necesario.   Si se requiere una autorizacin previa para que su compaa de seguros [place] su medicamento, por favor permtanos de 1 a 2 das hbiles para completar este proceso.  Los precios de los medicamentos varan con frecuencia dependiendo del Environmental consultant de dnde se surte la receta y alguna farmacias pueden ofrecer precios ms baratos.  El sitio web www.goodrx.com tiene cupones para medicamentos de Airline pilot. Los precios aqu no tienen en cuenta lo que podra costar con la ayuda del seguro (puede ser ms barato con su seguro), pero el sitio web puede darle el precio si no utiliz ningn  seguro.  - Puede imprimir el cupn correspondiente y llevarlo con su receta a la farmacia.  - Tambin puede pasar por nuestra oficina durante el horario de atencin regular y Charity fundraiser una tarjeta de cupones de GoodRx.  - Si necesita que su receta se enve electrnicamente a una farmacia diferente, informe a nuestra oficina a travs de MyChart de [place] o por telfono llamando al [phone] y presione la opcin 4.

## 2021-05-03 ENCOUNTER — Encounter: Payer: Self-pay | Admitting: Dermatology

## 2021-07-30 ENCOUNTER — Encounter: Payer: Self-pay | Admitting: Internal Medicine

## 2021-07-30 DIAGNOSIS — R7303 Prediabetes: Secondary | ICD-10-CM

## 2021-08-01 ENCOUNTER — Other Ambulatory Visit (INDEPENDENT_AMBULATORY_CARE_PROVIDER_SITE_OTHER): Payer: BC Managed Care – PPO

## 2021-08-01 DIAGNOSIS — R7303 Prediabetes: Secondary | ICD-10-CM | POA: Diagnosis not present

## 2021-08-01 LAB — HEMOGLOBIN A1C: Hgb A1c MFr Bld: 6 % (ref 4.6–6.5)

## 2021-08-06 ENCOUNTER — Ambulatory Visit: Payer: BC Managed Care – PPO | Admitting: Psychology

## 2021-08-23 ENCOUNTER — Ambulatory Visit: Payer: BC Managed Care – PPO | Admitting: Psychology

## 2021-11-12 ENCOUNTER — Telehealth: Payer: Self-pay | Admitting: *Deleted

## 2021-11-12 NOTE — Telephone Encounter (Signed)
Patient called c/o her pessary feels like some of the rubber is deteriorating. Patient said she had pessary for years, its still in placed but doesn't feel the same. Patient scheduled for annual exam on 12/19/21. I advised patient she can schedule sooner visit for you to exam her and pessary, she declined states its $90 dollars to come per visit and she doesn't have it for 2 visits. Patient said she will just wait until annual exam visit. Just FYI

## 2021-11-12 NOTE — Telephone Encounter (Signed)
Thank you for the update.  Encounter closed. 

## 2021-11-24 ENCOUNTER — Other Ambulatory Visit: Payer: Self-pay | Admitting: Internal Medicine

## 2021-12-17 ENCOUNTER — Other Ambulatory Visit: Payer: Self-pay | Admitting: Internal Medicine

## 2021-12-17 DIAGNOSIS — Z1231 Encounter for screening mammogram for malignant neoplasm of breast: Secondary | ICD-10-CM

## 2021-12-17 NOTE — Progress Notes (Signed)
56 y.o. G65P2002 Married Caucasian female here for annual exam.    Patient wants labs. She is concerned about prediabetes.  Patient had GDM.  She thinks pessary has gotten old and may need new one. The edges are worn on her current pessary. Wears it all the time.   It fits well and feels good to hold her prolapse  Using estrogen cream.   Dealing with constipation.   Dealing with urinary urgency and leakage.  Wears Poise pads.   Having treatment for depression with Dr. Toy Care.  Treating with Sprivato, and doing well.  Son married.   PCP: Pricilla Holm, MD    Patient's last menstrual period was 05/06/2009.           Sexually active: Yes.   Occ The current method of family planning is status post hysterectomy.    Exercising: Yes.     Walking, housework Smoker:  no  Health Maintenance: Pap:  08-01-14 Neg:Neg HR HPV, 09-27-10 Neg History of abnormal Pap:  yes, 2006 Hx LEEP MMG:  01-29-21 Neg/Birads1.  Has appointment.  Colonoscopy:   07/16/17 Normal f/u 10 years BMD:   n/a  Result  n/a TDaP:  2016 Gardasil:   no HIV: Neg in the past Hep C: no Screening Labs:  today.   reports that she has never smoked. She has never used smokeless tobacco. She reports that she does not drink alcohol and does not use drugs.  Past Medical History:  Diagnosis Date   Acne    Allergy    allergic rhinitis   Anxiety    Bladder stone 2018   Depression    Elevated hemoglobin A1c 2017   5.9   GERD (gastroesophageal reflux disease)    Hyperlipidemia    Kidney stone    Low serum vitamin D 2017   15    Past Surgical History:  Procedure Laterality Date   ABDOMINAL HYSTERECTOMY  07/11/2009    laparosccopically assisted vaginal hysterectomy with anterior and posterior colporrhaphy, transobturator Monarch sling, cystoscopy/ovaries remain   APPENDECTOMY     BLADDER SURGERY  07/2007   cystocele, pessary   bladder tack     2nd bladder surgery   CHOLECYSTECTOMY  2015   COLLAGEN INJECTION   07/07/2008   Periurethral injection - Dr. Matilde Sprang   CYST REMOVAL HAND     EXTRACORPOREAL SHOCK WAVE LITHOTRIPSY Right 08/06/2018   Procedure: EXTRACORPOREAL SHOCK WAVE LITHOTRIPSY (ESWL);  Surgeon: Billey Co, MD;  Location: [facility] ORS;  Service: Urology;  Laterality: Right;   INCONTINENCE SURGERY  08/04/2007   Spark miduethral sling, cystoscopy - Dr. Matilde Sprang   NASAL SINUS SURGERY  09/2015   Dr.Byers    Current Outpatient Medications  Medication Sig Dispense Refill   buPROPion (WELLBUTRIN XL) 150 MG 24 hr tablet Take 2 tablets (300 mg total) by mouth daily. 180 tablet 1   cholecalciferol (VITAMIN D3) 25 MCG (1000 UT) tablet Take 1,000 Units by mouth daily.     conjugated estrogens (PREMARIN) vaginal cream USE 1/2 G VAGINALLY TWICE OR THREE TIMES WEEKLY.     Dextromethorphan-Bupropion (AUVELITY PO) Take by mouth.     ibuprofen (ADVIL) 800 MG tablet TAKE 1 TABLET BY MOUTH EVERY 8 HOURS AS NEEDED 90 tablet 1   nitrofurantoin, macrocrystal-monohydrate, (MACROBID) 100 MG capsule Take 1 capsule (100 mg total) by mouth 2 (two) times daily. Take one capsule once with intercourse. 30 capsule 2   pantoprazole (PROTONIX) 40 MG tablet Take 1 tablet (40 mg total) by mouth daily.  Overdue for Annual appt w/labs must see provider for future refills 30 tablet 0   No current facility-administered medications for this visit.    Family History  Problem Relation Age of Onset   Diabetes Father    Diabetes Maternal Grandmother    Uterine cancer Paternal Aunt    Breast cancer Neg Hx    Colon cancer Neg Hx     Review of Systems  All other systems reviewed and are negative.   Exam:   BP 120/76   Pulse 79   Ht '5\' 2"'$  (1.575 m)   Wt 136 lb (61.7 kg)   LMP 05/06/2009   SpO2 98%   BMI 24.87 kg/m     General appearance: alert, cooperative and appears stated age Head: normocephalic, without obvious abnormality, atraumatic Neck: no adenopathy, supple, symmetrical, trachea midline and thyroid  normal to inspection and palpation Lungs: clear to auscultation bilaterally Breasts: normal appearance, no masses or tenderness, No nipple retraction or dimpling, No nipple discharge or bleeding, No axillary adenopathy Heart: regular rate and rhythm Abdomen: soft, non-tender; no masses, no organomegaly Extremities: extremities normal, atraumatic, no cyanosis or edema Skin: skin color, texture, turgor normal. No rashes or lesions Lymph nodes: cervical, supraclavicular, and axillary nodes normal. Neurologic: grossly normal  Pelvic: External genitalia:  no lesions              No abnormal inguinal nodes palpated.              Urethra:  normal appearing urethra with no masses, tenderness or lesions              Bartholins and Skenes: normal                 Vagina: normal appearing vagina with normal color and discharge, no lesions.  Good support.               Cervix: absent              Pap taken: no Bimanual Exam:  Uterus:  absent              Adnexa: no mass, fullness, tenderness              Rectal exam: yes.  Confirms.              Anus:  normal sphincter tone, no lesions  Pessary removed, cleansed, and given to the patient in a biobag.   Patient received new Premier pessary #3 ring with support, lot [id number], exp 04/05/31.  Pessary placed vaginally for patient.  Comfortable.  Chaperone was present for exam:  Estill Bamberg, CMA  Assessment:   Well woman visit with gynecologic exam. Cystocele. Treated with pessary.  Pessary maintenance. Vaginal atrophy.  Improved with vaginal estrogen cream.  Overactive bladder.  Hx recurrent UTIs.  Renal stones.  Hx elevated hemoglobin A1C.    Hx hyperlipidemia. Hx depression and anxiety.   Plan: Mammogram screening discussed. Self breast awareness reviewed. Pap and HR HPV as above. Guidelines for Calcium, Vitamin D, regular exercise program including cardiovascular and weight bearing exercise. Rx for estradiol cream.  Potential effect on breast  cancer reviewed.  Continue pessary care. Routine labs.  Start Myrbetriq 25 mg daily.  Side effects discussed.  FU in 6 weeks.  Follow up annually and prn.   After visit summary provided.   20  total time was spent for this patient encounter, including preparation, face-to-face counseling with the patient, coordination of care, and documentation of  the encounter in addition to doing her annual exam.  Patient was fitted for anew pessary and received treatment for overactive bladder with a new prescription medication.

## 2021-12-18 ENCOUNTER — Other Ambulatory Visit: Payer: Self-pay | Admitting: Internal Medicine

## 2021-12-19 ENCOUNTER — Other Ambulatory Visit: Payer: Self-pay | Admitting: Obstetrics and Gynecology

## 2021-12-19 ENCOUNTER — Encounter: Payer: Self-pay | Admitting: Obstetrics and Gynecology

## 2021-12-19 ENCOUNTER — Ambulatory Visit (INDEPENDENT_AMBULATORY_CARE_PROVIDER_SITE_OTHER): Payer: BC Managed Care – PPO | Admitting: Obstetrics and Gynecology

## 2021-12-19 VITALS — BP 120/76 | HR 79 | Ht 62.0 in | Wt 136.0 lb

## 2021-12-19 DIAGNOSIS — N8110 Cystocele, unspecified: Secondary | ICD-10-CM

## 2021-12-19 DIAGNOSIS — Z4689 Encounter for fitting and adjustment of other specified devices: Secondary | ICD-10-CM

## 2021-12-19 DIAGNOSIS — Z01419 Encounter for gynecological examination (general) (routine) without abnormal findings: Secondary | ICD-10-CM

## 2021-12-19 DIAGNOSIS — N3281 Overactive bladder: Secondary | ICD-10-CM | POA: Diagnosis not present

## 2021-12-19 DIAGNOSIS — R7303 Prediabetes: Secondary | ICD-10-CM | POA: Diagnosis not present

## 2021-12-19 MED ORDER — MIRABEGRON ER 25 MG PO TB24: 25.0000 mg | ORAL_TABLET | Freq: Every day | ORAL | 1 refills | Status: DC

## 2021-12-19 MED ORDER — ESTRADIOL 0.1 MG/GM VA CREA: TOPICAL_CREAM | VAGINAL | 2 refills | Status: DC

## 2021-12-20 ENCOUNTER — Encounter: Payer: Self-pay | Admitting: Internal Medicine

## 2021-12-20 LAB — COMPREHENSIVE METABOLIC PANEL
AG Ratio: 1.8 (calc) (ref 1.0–2.5)
ALT: 16 U/L (ref 6–29)
AST: 18 U/L (ref 10–35)
Albumin: 4.4 g/dL (ref 3.6–5.1)
Alkaline phosphatase (APISO): 93 U/L (ref 37–153)
BUN: 14 mg/dL (ref 7–25)
CO2: 29 mmol/L (ref 20–32)
Calcium: 9.7 mg/dL (ref 8.6–10.4)
Chloride: 105 mmol/L (ref 98–110)
Creat: 0.89 mg/dL (ref 0.50–1.03)
Globulin: 2.4 g/dL (calc) (ref 1.9–3.7)
Glucose, Bld: 78 mg/dL (ref 65–99)
Potassium: 4.5 mmol/L (ref 3.5–5.3)
Sodium: 142 mmol/L (ref 135–146)
Total Bilirubin: 0.3 mg/dL (ref 0.2–1.2)
Total Protein: 6.8 g/dL (ref 6.1–8.1)

## 2021-12-20 LAB — CBC
HCT: 39.5 % (ref 35.0–45.0)
Hemoglobin: 13.3 g/dL (ref 11.7–15.5)
MCH: 30.6 pg (ref 27.0–33.0)
MCHC: 33.7 g/dL (ref 32.0–36.0)
MCV: 90.8 fL (ref 80.0–100.0)
MPV: 9.9 fL (ref 7.5–12.5)
Platelets: 270 10*3/uL (ref 140–400)
RBC: 4.35 10*6/uL (ref 3.80–5.10)
RDW: 11.9 % (ref 11.0–15.0)
WBC: 5.8 10*3/uL (ref 3.8–10.8)

## 2021-12-20 LAB — LIPID PANEL
Cholesterol: 217 mg/dL — ABNORMAL HIGH (ref <200)
HDL: 57 mg/dL (ref >=50)
LDL Cholesterol (Calc): 131 mg/dL (calc) — ABNORMAL HIGH
Non-HDL Cholesterol (Calc): 160 mg/dL (calc) — ABNORMAL HIGH (ref <130)
Total CHOL/HDL Ratio: 3.8 (calc) (ref <5.0)
Triglycerides: 155 mg/dL — ABNORMAL HIGH (ref <150)

## 2021-12-20 LAB — HEMOGLOBIN A1C
Hgb A1c MFr Bld: 5.7 % of total Hgb — ABNORMAL HIGH (ref <5.7)
Mean Plasma Glucose: 117 mg/dL
eAG (mmol/L): 6.5 mmol/L

## 2021-12-21 NOTE — Patient Instructions (Signed)

## 2021-12-24 ENCOUNTER — Encounter: Payer: Self-pay | Admitting: Dietician

## 2021-12-24 ENCOUNTER — Encounter: Payer: BC Managed Care – PPO | Attending: Obstetrics and Gynecology | Admitting: Dietician

## 2021-12-24 VITALS — Ht 62.0 in | Wt 134.2 lb

## 2021-12-24 DIAGNOSIS — R7303 Prediabetes: Secondary | ICD-10-CM

## 2021-12-24 NOTE — Patient Instructions (Signed)
Keep carbohydrates to 45 grams or less with each meal. Try to choose some whole grain options such as whole wheat bread, brown rice, whole grain oats.  Use resources to help choose healthy restaurant meals, such as lordetclan.com; or look up an individual restaurant's site, or try calorieking.com Control portions of sweets, or choose healthier options, especially fruits -- dress up fruits with yogurt, extra small amount of ice cream, low sugar pudding, etc if needed.

## 2021-12-24 NOTE — Progress Notes (Signed)
Medical Nutrition Therapy: Visit start time: 1330  end time: 1430  Assessment:   Referral Diagnosis: prediabetes Other medical history/ diagnoses: none significant Psychosocial issues/ stress concerns: history of anxiety and depression; currently controlled with medication  Medications, supplements: reconciled list in medical record   Preferred learning method:  Visual   Current weight: 134.2lbs Height: 5'2" BMI: 24.55 Patient's personal weight goal: 125lbs  Progress and evaluation:  Reports history of HbA1C of 6% after she was eating ice cream often; more recently 5.7% Many meals are out, little cooking at home. 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   Frustration with cooking at home is that husband and son don't eat vegetables much, but patient enjoys veg, not as much meat Does not enjoy a lot of meats; loves fruits Food allergies: none Special diet practices: no Patient seeks help with maintaining healthy body weight, limiting sweets, controlling blood sugar Next PCP appt is 02/2022   Dietary Intake:  Usual eating pattern includes 3 meals and 1-2? snacks per day. Dining out frequency: most meals  Who plans meals/ buys groceries? Patient, spouse  Who prepares meals? Patient, spouse  Breakfast: ? Snack: ? Lunch: small portions Snack: pkg peanut butter crackers Supper: usually out at various restaurants; time varies Snack: something sweet ie ice cream, trying to decrease/ limit Beverages: water, diet soda  Physical activity: activitiy has increased since no longer working sedentary job   Intervention:   Nutrition Care Education:   Basic nutrition: basic food groups; appropriate nutrient balance; appropriate meal and snack schedule; general nutrition guidelines    Weight control: identifying healthy  weight; importance of low sugar and low fat choices; portion control; estimated energy needs for 5-10lbs weight loss at 1300 kcal, provided guidance for 45% CHO, 25% pro, 30% fat Advanced nutrition: cooking techniques; dining out; alternatives for sweets Pre-Diabetes: appropriate meal and snack schedule; appropriate carb intake and balance, healthy carb choices; role of fiber, protein, fat; physical activity   Other intervention notes: Patient has begun making healthy lifestyle changes; she is motivated to continue Established goals for additional change with input from patient. No follow up scheduled at this time; patient will schedule later if needed.   Nutritional Diagnosis:  Leon-2.2 Altered nutrition-related laboratory As related to prediabetes.  As evidenced by elevated HbA1C.   Education Materials given:  Museum/gallery conservator with food lists, sample meal pattern Recipes Sample menus Visit summary with goals/ instructions   Learner/ who was taught:  Patient   Level of understanding: Verbalizes/ demonstrates competency  Demonstrated degree of understanding via:   Teach back Learning barriers: None  Willingness to learn/ readiness for change: Eager, change in progress   Monitoring and Evaluation:  Dietary intake, exercise, BG control, and body weight      follow up: prn

## 2022-01-01 ENCOUNTER — Encounter: Payer: Self-pay | Admitting: Obstetrics and Gynecology

## 2022-01-14 ENCOUNTER — Ambulatory Visit: Payer: BC Managed Care – PPO | Admitting: Internal Medicine

## 2022-01-14 ENCOUNTER — Encounter: Payer: Self-pay | Admitting: Internal Medicine

## 2022-01-14 ENCOUNTER — Other Ambulatory Visit: Payer: Self-pay | Admitting: Obstetrics and Gynecology

## 2022-01-14 VITALS — BP 118/74 | HR 75 | Ht 62.0 in | Wt 139.0 lb

## 2022-01-14 DIAGNOSIS — R197 Diarrhea, unspecified: Secondary | ICD-10-CM

## 2022-01-14 DIAGNOSIS — Z0000 Encounter for general adult medical examination without abnormal findings: Secondary | ICD-10-CM

## 2022-01-14 DIAGNOSIS — K219 Gastro-esophageal reflux disease without esophagitis: Secondary | ICD-10-CM

## 2022-01-14 DIAGNOSIS — E7800 Pure hypercholesterolemia, unspecified: Secondary | ICD-10-CM

## 2022-01-14 DIAGNOSIS — N39498 Other specified urinary incontinence: Secondary | ICD-10-CM

## 2022-01-14 DIAGNOSIS — Z23 Encounter for immunization: Secondary | ICD-10-CM

## 2022-01-14 DIAGNOSIS — R7303 Prediabetes: Secondary | ICD-10-CM | POA: Diagnosis not present

## 2022-01-14 DIAGNOSIS — K582 Mixed irritable bowel syndrome: Secondary | ICD-10-CM

## 2022-01-14 LAB — TSH: TSH: 0.85 u[IU]/mL (ref 0.35–5.50)

## 2022-01-14 LAB — T4, FREE: Free T4: 0.81 ng/dL (ref 0.60–1.60)

## 2022-01-14 MED ORDER — PANTOPRAZOLE SODIUM 40 MG PO TBEC: 40.0000 mg | DELAYED_RELEASE_TABLET | Freq: Every day | ORAL | 3 refills | Status: DC

## 2022-01-14 NOTE — Patient Instructions (Signed)
We will check the labs and have done the flu shot.

## 2022-01-14 NOTE — Progress Notes (Unsigned)
   Subjective:   Patient ID: Sheila [name], female    DOB: [birth year], 56 y.o.   MRN: [id number]  Medication Refill Pertinent negatives include no abdominal pain, chest pain, coughing, nausea or vomiting.   The patient is here for physical.  PMH, [place], social history reviewed and updated  Review of Systems  Constitutional: Negative.   HENT: Negative.    Eyes: Negative.   Respiratory:  Negative for cough, chest tightness and shortness of breath.   Cardiovascular:  Negative for chest pain, palpitations and leg swelling.  Gastrointestinal:  Negative for abdominal distention, abdominal pain, constipation, diarrhea, nausea and vomiting.  Musculoskeletal: Negative.   Skin: Negative.   Neurological: Negative.   Psychiatric/Behavioral: Negative.      Objective:  Physical Exam Constitutional:      Appearance: She is well-developed.  HENT:     Head: Normocephalic and atraumatic.  Cardiovascular:     Rate and Rhythm: Normal rate and regular rhythm.  Pulmonary:     Effort: Pulmonary effort is normal. No respiratory distress.     Breath sounds: Normal breath sounds. No wheezing or rales.  Abdominal:     General: Bowel sounds are normal. There is no distension.     Palpations: Abdomen is soft.     Tenderness: There is no abdominal tenderness. There is no rebound.  Musculoskeletal:     Cervical back: Normal range of motion.  Skin:    General: Skin is warm and dry.  Neurological:     Mental Status: She is alert and oriented to person, place, and time.     Coordination: Coordination normal.     Vitals:   01/14/22 1402  BP: 118/74  Pulse: 75  SpO2: 99%  Weight: 139 lb (63 kg)  Height: '5\' 2"'$  (1.575 m)    Assessment & Plan:  Flu shot given at visit

## 2022-01-15 DIAGNOSIS — Z0000 Encounter for general adult medical examination without abnormal findings: Secondary | ICD-10-CM | POA: Insufficient documentation

## 2022-01-15 LAB — GLIADIN ANTIBODIES, SERUM
Gliadin IgA: <1 U/mL
Gliadin IgG: <1 U/mL

## 2022-01-15 LAB — TISSUE TRANSGLUTAMINASE, IGA: (tTG) Ab, IgA: <1 U/mL

## 2022-01-15 NOTE — Assessment & Plan Note (Signed)
Recently checked and slightly lower. Encouraged continued diet and lifestyle changes.

## 2022-01-15 NOTE — Assessment & Plan Note (Signed)
Flu shot given. Covid-19 counseled. Shingrix complete. Tetanus up to date. Colonoscopy up to date. Mammogram up to date, pap smear up to date. Counseled about sun safety and mole surveillance. Counseled about the dangers of distracted driving. Given 10 year screening recommendations.   

## 2022-01-15 NOTE — Assessment & Plan Note (Signed)
Reviewed recent labs stable.

## 2022-01-15 NOTE — Assessment & Plan Note (Signed)
Taking gemtesa and working well.

## 2022-01-15 NOTE — Assessment & Plan Note (Signed)
Taking protonix and well controlled. Refilled 40 mg daily today.

## 2022-01-15 NOTE — Assessment & Plan Note (Signed)
Stable, up to date on colonoscopy. Never had celiac testing which is ordered today.

## 2022-01-17 LAB — RETICULIN ANTIBODIES, IGA W TITER: Reticulin Ab, IgA: NEGATIVE titer (ref <2.5)

## 2022-01-22 ENCOUNTER — Encounter: Payer: Self-pay | Admitting: Obstetrics and Gynecology

## 2022-01-23 ENCOUNTER — Ambulatory Visit
Admission: RE | Admit: 2022-01-23 | Discharge: 2022-01-23 | Disposition: A | Discharge status: Home / Self Care | Payer: BC Managed Care – PPO | Source: Ambulatory Visit | Attending: Urgent Care | Admitting: Urgent Care

## 2022-01-23 ENCOUNTER — Encounter: Payer: Self-pay | Admitting: Obstetrics and Gynecology

## 2022-01-23 VITALS — BP 122/75 | HR 69 | Temp 97.9°F | Resp 16

## 2022-01-23 DIAGNOSIS — R8290 Unspecified abnormal findings in urine: Secondary | ICD-10-CM | POA: Insufficient documentation

## 2022-01-23 DIAGNOSIS — N3001 Acute cystitis with hematuria: Secondary | ICD-10-CM | POA: Insufficient documentation

## 2022-01-23 LAB — POCT URINALYSIS DIP (MANUAL ENTRY)
Bilirubin, UA: NEGATIVE
Glucose, UA: NEGATIVE mg/dL
Nitrite, UA: POSITIVE — AB
Protein Ur, POC: NEGATIVE mg/dL
Spec Grav, UA: 1.025 (ref 1.010–1.025)
Urobilinogen, UA: 0.2 E.U./dL
pH, UA: 5.5 (ref 5.0–8.0)

## 2022-01-23 MED ORDER — NITROFURANTOIN MONOHYD MACRO 100 MG PO CAPS: 100.0000 mg | ORAL_CAPSULE | Freq: Two times a day (BID) | ORAL | 0 refills | Status: DC

## 2022-01-23 NOTE — ED Triage Notes (Signed)
Pt. States that she has foul smelling urine that started yesterday.

## 2022-01-23 NOTE — ED Provider Notes (Addendum)
Sheila [name]    CSN: [id number] Arrival date & time: 01/23/22  8338      History   Chief Complaint Chief Complaint  Patient presents with   URI    I have a bladder infection. - Entered by patient    HPI Sheila [name] is a 56 y.o. female.    URI   Presents to UC with complaint of foul-smelling urine starting yesterday.  Denies dysuria, hematuria, abdominal pain, frequency/urgency new back/flank pain, fever/chills.  Patient endorses chronic lower back pain.  Patient endorses frequent UTIs with last about 6 or 7 months ago.  Has received treatment by urogynecology who recommended supplement D-Mannose which she has been taking daily since.  Patient endorses sexual intercourse with her husband a couple of days ago and states she generally gets UTIs afterward even if she urinates or showers following.  Past Medical History:  Diagnosis Date   Acne    Allergy    allergic rhinitis   Anxiety    Bladder stone 2018   Depression    Elevated hemoglobin A1c 2017   5.9   GERD (gastroesophageal reflux disease)    Hyperlipidemia    Kidney stone    Low serum vitamin D 2017   15    Patient Active Problem List   Diagnosis Date Noted   Routine general medical examination at a health care facility 01/15/2022   Periodic limb movements of sleep 03/23/2019   Sleep-disordered breathing 12/28/2018   Sleep apnea 12/20/2018   Ureteral stone with hydronephrosis 08/05/2018   Vaginal adhesions 07/28/2017   Female bladder prolapse 07/28/2017   Pre-diabetes 12/05/2015   IBS (irritable bowel syndrome) 06/15/2013   Migraine headache 02/21/2013   Depression 10/13/2012   GERD (gastroesophageal reflux disease) 03/23/2007   HYPERCHOLESTEROLEMIA, PURE 10/14/2006   GAD (generalized anxiety disorder) 10/13/2006   STRESS INCONTINENCE 10/13/2006    Past Surgical History:  Procedure Laterality Date   ABDOMINAL HYSTERECTOMY  07/11/2009    laparosccopically assisted vaginal  hysterectomy with anterior and posterior colporrhaphy, transobturator Monarch sling, cystoscopy/ovaries remain   APPENDECTOMY     BLADDER SURGERY  07/2007   cystocele, pessary   bladder tack     2nd bladder surgery   CHOLECYSTECTOMY  2015   COLLAGEN INJECTION  07/07/2008   Periurethral injection - Dr. Matilde Sprang   CYST REMOVAL HAND     EXTRACORPOREAL SHOCK WAVE LITHOTRIPSY Right 08/06/2018   Procedure: EXTRACORPOREAL SHOCK WAVE LITHOTRIPSY (ESWL);  Surgeon: Billey Co, MD;  Location: [facility] ORS;  Service: Urology;  Laterality: Right;   INCONTINENCE SURGERY  08/04/2007   Spark miduethral sling, cystoscopy - Dr. Matilde Sprang   NASAL SINUS SURGERY  09/2015   Dr.Byers    OB History     Gravida  2   Para  2   Term  2   Preterm      AB      Living  2      SAB      IAB      Ectopic      Multiple      Live Births               Home Medications    Prior to Admission medications   Medication Sig Start Date End Date Taking? Authorizing Provider  cholecalciferol (VITAMIN D3) 25 MCG (1000 UT) tablet Take 1,000 Units by mouth daily.    [provider]  D-MANNOSE PO Take 1 tablet by mouth daily. For prevention of UTIs  [provider]  Dextromethorphan-Bupropion (AUVELITY PO) Take by mouth.    [provider]  estradiol (ESTRACE) 0.1 MG/GM vaginal cream Place 1/2 g vaginally two or three times per week as needed to maintain symptom relief. 12/19/21   Nunzio Cobbs, MD  ibuprofen (ADVIL) 800 MG tablet TAKE 1 TABLET BY MOUTH EVERY 8 HOURS AS NEEDED 01/31/21   Hoyt Koch, MD  Multiple Vitamins-Minerals (MULTIVITAMIN WOMEN PO) Take 1 tablet by mouth daily.    [provider]  nitrofurantoin, macrocrystal-monohydrate, (MACROBID) 100 MG capsule Take 1 capsule (100 mg total) by mouth 2 (two) times daily. Take one capsule once with intercourse. 12/18/20   Nunzio Cobbs, MD  pantoprazole (PROTONIX) 40 MG tablet  Take 1 tablet (40 mg total) by mouth daily. 01/14/22   Hoyt Koch, MD  Vibegron (GEMTESA) 75 MG TABS Take 75 mg by mouth daily. 12/20/21   Nunzio Cobbs, MD    Family History Family History  Problem Relation Age of Onset   Diabetes Father    Diabetes Maternal Grandmother    Uterine cancer Paternal Aunt    Breast cancer Neg Hx    Colon cancer Neg Hx     Social History Social History   Tobacco Use   Smoking status: Never   Smokeless tobacco: Never  Vaping Use   Vaping Use: Never used  Substance Use Topics   Alcohol use: No    Alcohol/week: 0.0 standard drinks of alcohol   Drug use: No     Allergies   Patient has no known allergies.   Review of Systems Review of Systems   Physical Exam Triage Vital Signs ED Triage Vitals  Enc Vitals Group     BP      Pulse      Resp      Temp      Temp src      SpO2      Weight      Height      Head Circumference      Peak Flow      Pain Score      Pain Loc      Pain Edu?      Excl. in Sheila [name]?    No data found.  Updated Vital Signs LMP 05/06/2009   Visual Acuity Right Eye Distance:   Left Eye Distance:   Bilateral Distance:    Right Eye Near:   Left Eye Near:    Bilateral Near:     Physical Exam Vitals reviewed.  Constitutional:      Appearance: Normal appearance. She is not ill-appearing.  HENT:     Head: Normocephalic and atraumatic.  Skin:    General: Skin is warm and dry.  Neurological:     General: No focal deficit present.     Mental Status: She is alert and oriented to person, place, and time.  Psychiatric:        Mood and Affect: Mood normal.        Behavior: Behavior normal.      UC Treatments / Results  Labs (all labs ordered are listed, but only abnormal results are displayed) Labs Reviewed - No data to display  EKG   Radiology No results found.  Procedures Procedures (including critical care time)  Medications Ordered in UC Medications - No data to  display  Initial Impression / Assessment and Plan / UC Course  I have reviewed the triage  vital signs and the nursing notes.  Pertinent labs & imaging results that were available during my care of the patient were reviewed by me and considered in my medical decision making (see chart for details).   UA positive for moderate blood, 1+ leuks and positive for nitrites. No fever/chills, flank pain and no concern for Pyelo.  Treating for acute cystitis with Macrobid.   Final Clinical Impressions(s) / UC Diagnoses   Final diagnoses:  None   Discharge Instructions   None    ED Prescriptions   None    PDMP not reviewed this encounter.   Sheila [name], [place] 01/23/22 [place], [place], [place] 01/23/22 [phone]

## 2022-01-23 NOTE — Discharge Instructions (Addendum)
Follow-up here or with your primary care provider if your symptoms do not respond to treatment.  We have ordered a urine culture to verify that the bacteria is susceptible to the antibiotic that you are being treated with.  If the results indicate a need to change antibiotic, someone will contact you.

## 2022-01-24 ENCOUNTER — Other Ambulatory Visit: Payer: Self-pay

## 2022-01-24 LAB — URINE CULTURE: Culture: NO GROWTH

## 2022-01-24 MED ORDER — GEMTESA 75 MG PO TABS: 75.0000 mg | ORAL_TABLET | Freq: Every day | ORAL | 9 refills | Status: DC

## 2022-01-24 NOTE — Telephone Encounter (Signed)
Nunzio Cobbs, MD  You 25 minutes ago (1:36 PM)   Patient does not need to return for a recheck.  Please make sure she has refills until her annual exam is due next year.

## 2022-01-30 ENCOUNTER — Ambulatory Visit: Payer: BC Managed Care – PPO | Admitting: Obstetrics and Gynecology

## 2022-01-31 ENCOUNTER — Ambulatory Visit
Admission: RE | Admit: 2022-01-31 | Discharge: 2022-01-31 | Disposition: A | Discharge status: Home / Self Care | Payer: BC Managed Care – PPO | Source: Ambulatory Visit | Attending: Internal Medicine | Admitting: Internal Medicine

## 2022-01-31 DIAGNOSIS — Z1231 Encounter for screening mammogram for malignant neoplasm of breast: Secondary | ICD-10-CM

## 2022-02-07 ENCOUNTER — Telehealth: Payer: Self-pay

## 2022-02-07 NOTE — Telephone Encounter (Signed)
Left message on voicemail to contact our office to reschedule her appointment.

## 2022-04-10 ENCOUNTER — Encounter: Payer: Self-pay | Admitting: Internal Medicine

## 2022-04-11 NOTE — Telephone Encounter (Signed)
Patient has been scheduled for in OV.

## 2022-04-16 ENCOUNTER — Ambulatory Visit: Payer: BC Managed Care – PPO | Admitting: Internal Medicine

## 2022-04-16 ENCOUNTER — Encounter: Payer: Self-pay | Admitting: Internal Medicine

## 2022-04-16 VITALS — BP 118/80 | HR 72 | Temp 97.7°F | Ht 62.0 in | Wt 138.0 lb

## 2022-04-16 DIAGNOSIS — K582 Mixed irritable bowel syndrome: Secondary | ICD-10-CM | POA: Diagnosis not present

## 2022-04-16 DIAGNOSIS — R1032 Left lower quadrant pain: Secondary | ICD-10-CM | POA: Diagnosis not present

## 2022-04-16 NOTE — Progress Notes (Signed)
   Subjective:   Patient ID: Sheila [name], female    DOB: 25-Jan-1966, 56 y.o.   MRN: [id number]  HPI The patient is a 56 YO female coming in for worsening symptoms of IBS. Pressure low pelvis after eating. Mixed constipation/diarrhea. No .ectal bleeding.  Review of Systems  Constitutional: Negative.   HENT: Negative.    Eyes: Negative.   Respiratory:  Negative for cough, chest tightness and shortness of breath.   Cardiovascular:  Negative for chest pain, palpitations and leg swelling.  Gastrointestinal:  Positive for abdominal pain. Negative for abdominal distention, constipation, diarrhea, nausea and vomiting.  Musculoskeletal: Negative.   Skin: Negative.   Neurological: Negative.   Psychiatric/Behavioral: Negative.      Objective:  Physical Exam Constitutional:      Appearance: She is well-developed.  HENT:     Head: Normocephalic and atraumatic.  Cardiovascular:     Rate and Rhythm: Normal rate and regular rhythm.  Pulmonary:     Effort: Pulmonary effort is normal. No respiratory distress.     Breath sounds: Normal breath sounds. No wheezing or rales.  Abdominal:     General: Bowel sounds are normal. There is no distension.     Palpations: Abdomen is soft.     Tenderness: There is abdominal tenderness. There is no rebound.  Musculoskeletal:     Cervical back: Normal range of motion.  Skin:    General: Skin is warm and dry.  Neurological:     Mental Status: She is alert and oriented to person, place, and time.     Coordination: Coordination normal.     Vitals:   04/16/22 1303  BP: 118/80  Pulse: 72  Temp: 97.7 F (36.5 C)  TempSrc: Oral  SpO2: 98%  Weight: 138 lb (62.6 kg)  Height: '5\' 2"'$  (1.575 m)    Assessment & Plan:  Visit time 20 minutes in face to face communication with patient and coordination of care, additional 10 minutes spent in record review, coordination or care, ordering tests, communicating/referring to other healthcare professionals,  documenting in medical records all on the same day of the visit for total time 30 minutes spent on the visit.

## 2022-04-16 NOTE — Patient Instructions (Signed)
We will get the CT scan of the stomach.

## 2022-04-19 NOTE — Assessment & Plan Note (Signed)
Suspect constipation could be causing discomfort. Advised to start metamucil or benefiber daily to help with more regular BM. If no resolution let us know and we can order CT abdomen/pelvis to ensure no changes. Colonoscopy is up to date.

## 2022-04-24 ENCOUNTER — Telehealth: Payer: Self-pay

## 2022-04-24 NOTE — Telephone Encounter (Signed)
Ct Scan has been approved

## 2022-05-02 ENCOUNTER — Ambulatory Visit: Payer: BC Managed Care – PPO | Admitting: Dermatology

## 2022-05-03 ENCOUNTER — Ambulatory Visit
Admission: RE | Admit: 2022-05-03 | Discharge: 2022-05-03 | Disposition: A | Discharge status: Home / Self Care | Payer: BC Managed Care – PPO | Source: Ambulatory Visit | Attending: Internal Medicine | Admitting: Internal Medicine

## 2022-05-03 DIAGNOSIS — R1032 Left lower quadrant pain: Secondary | ICD-10-CM

## 2022-05-08 ENCOUNTER — Encounter: Payer: Self-pay | Admitting: Dermatology

## 2022-05-08 ENCOUNTER — Ambulatory Visit: Payer: BC Managed Care – PPO | Admitting: Dermatology

## 2022-05-08 DIAGNOSIS — B353 Tinea pedis: Secondary | ICD-10-CM

## 2022-05-08 DIAGNOSIS — Z1283 Encounter for screening for malignant neoplasm of skin: Secondary | ICD-10-CM | POA: Diagnosis not present

## 2022-05-08 DIAGNOSIS — L820 Inflamed seborrheic keratosis: Secondary | ICD-10-CM | POA: Diagnosis not present

## 2022-05-08 DIAGNOSIS — L988 Other specified disorders of the skin and subcutaneous tissue: Secondary | ICD-10-CM | POA: Diagnosis not present

## 2022-05-08 DIAGNOSIS — L821 Other seborrheic keratosis: Secondary | ICD-10-CM

## 2022-05-08 DIAGNOSIS — D2372 Other benign neoplasm of skin of left lower limb, including hip: Secondary | ICD-10-CM

## 2022-05-08 DIAGNOSIS — Z85828 Personal history of other malignant neoplasm of skin: Secondary | ICD-10-CM

## 2022-05-08 DIAGNOSIS — D369 Benign neoplasm, unspecified site: Secondary | ICD-10-CM

## 2022-05-08 DIAGNOSIS — L565 Disseminated superficial actinic porokeratosis (DSAP): Secondary | ICD-10-CM | POA: Diagnosis not present

## 2022-05-08 DIAGNOSIS — D229 Melanocytic nevi, unspecified: Secondary | ICD-10-CM

## 2022-05-08 DIAGNOSIS — L814 Other melanin hyperpigmentation: Secondary | ICD-10-CM

## 2022-05-08 DIAGNOSIS — L578 Other skin changes due to chronic exposure to nonionizing radiation: Secondary | ICD-10-CM

## 2022-05-08 MED ORDER — CICLOPIROX OLAMINE 0.77 % EX CREA: TOPICAL_CREAM | Freq: Two times a day (BID) | CUTANEOUS | 2 refills | Status: AC

## 2022-05-08 NOTE — Progress Notes (Signed)
Follow-Up Visit   Subjective  Sheila [name] is a 57 y.o. female who presents for the following: Annual Exam (Patient has a spot at right cheek that was treated prior with LN2 but has come back. She also has some itching at left foot, she has been using over the counter lamisil. Patient advises she has had a skin cancer at back, possibly BCC, treated in [place] > 15 years ago. Hx DSAP).  The patient presents for Total-Body Skin Exam (TBSE) for skin cancer screening and mole check.  The patient has spots, moles and lesions to be evaluated, some may be new or changing and the patient has concerns that these could be cancer.  The following portions of the chart were reviewed this encounter and updated as appropriate:  Tobacco  Allergies  Meds  Problems  Med Hx  Surg Hx  Fam Hx      Review of Systems: No other skin or systemic complaints except as noted in HPI or Assessment and Plan.   Objective  Well appearing patient in no apparent distress; mood and affect are within normal limits.  A full examination was performed including scalp, head, eyes, ears, nose, lips, neck, chest, axillae, abdomen, back, buttocks, bilateral upper extremities, bilateral lower extremities, hands, feet, fingers, toes, fingernails, and toenails. All findings within normal limits unless otherwise noted below.  left lateral thigh Erythematous thin pink papule with peripheral scale  arms, legs, chest, back Thin scaly pink papules  right cheek Erythematous stuck-on, waxy papule or plaque  Head - Anterior (Face) Rhytides and volume loss.   Left 3rd Metatarsal Plantar Area Scaly patches   Assessment & Plan  Benign neoplasm left lateral thigh  C/w porokeratosis  Symptomatic, irritating, patient would like treated.  Prior to procedure, discussed risks of blister formation, small wound, skin dyspigmentation, or rare scar following cryotherapy. Recommend Vaseline ointment to treated areas while  healing.   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 Currently well-controlled.   Continue Prescription [facility] Cholesterol-Lovastatin Ointment twice daily as needed.  Can start niacinamide 500 mg twice a day   Monitor for any changing lesions.  Inflamed seborrheic keratosis right cheek  Destruction of lesion - right cheek  Destruction method: electrodesiccation and curettage   Destruction method comment:  Electrodessication only Informed consent: discussed and consent obtained   Timeout:  patient name, date of birth, surgical site, and procedure verified Anesthesia: the lesion was anesthetized in a standard fashion   Anesthetic:  1% lidocaine w/ epinephrine 1-100,000 buffered w/ 8.4% NaHCO3 Hemostasis achieved with:  electrodesiccation Outcome: patient tolerated procedure well with no complications   Post-procedure details: sterile dressing applied and wound care instructions given   Dressing type: petrolatum    Elastosis of skin Head - Anterior (Face)  Will prescribe [facility] Anti-Aging Tretinoin 0.025%/Niacinamide/Vitamin C/Vitamin E/Turmeric/Resveratrol with Hyaluronic Acid. Apply pea sized amount nightly to the entire face.  The patient was advised this is not covered by insurance since it is made by a compounding pharmacy. They will receive an email to check out and the medication will be mailed to their home.   Topical retinoid  medications like tretinoin can cause dryness and irritation when first started. Only apply a pea-sized amount to the entire  affected area. Avoid applying it around the eyes, edges of mouth and creases at the nose. If you experience irritation, use a good moisturizer first and/or apply the medicine less often. If you are doing well with the medicine, you can increase how often you use it until you are applying every night. Be careful with sun protection while using this medication as it can make you sensitive to the sun. This medicine should not be used by pregnant women.    Tinea pedis of left foot Left 3rd Metatarsal Plantar Area  She has failed OTC lamisil cream  Start ciclopirox cream twice a day for 6 weeks to affected areas    ciclopirox (LOPROX) 0.77 % cream - Left 3rd Metatarsal Plantar Area Apply topically 2 (two) times daily. For 6 weeks   Lentigines - Scattered tan macules - Due to sun exposure - Benign-appearing, observe - Recommend daily broad spectrum sunscreen SPF 30+ to sun-exposed areas, reapply every 2 hours as needed. - Call for any changes  Seborrheic Keratoses - Stuck-on, waxy, tan-brown papules and/or plaques  - Benign-appearing - Discussed benign etiology and prognosis. - Observe - Call for any changes  Melanocytic Nevi - Tan-brown and/or pink-flesh-colored symmetric macules and papules - Benign appearing on exam today - Observation - Call clinic for new or changing moles - Recommend daily use of broad spectrum spf 30+ sunscreen to sun-exposed areas.   Hemangiomas - Red papules - Discussed benign nature - Observe - Call for any changes  Actinic Damage - Chronic condition, secondary to cumulative UV/sun exposure - diffuse scaly erythematous macules with underlying dyspigmentation - Recommend daily broad spectrum sunscreen SPF 30+ to sun-exposed areas, reapply every 2 hours as needed.  - Staying in the shade or wearing long sleeves, sun glasses (UVA+UVB protection) and wide brim hats (4-inch brim around the entire circumference of the hat) are also recommended for sun  protection.  - Call for new or changing lesions.  Skin cancer screening performed today.  History of Skin Cancer   Clear. Observe for recurrence.  Call clinic for new or changing lesions.   Recommend regular skin exams, daily broad-spectrum spf 30+ sunscreen use, and photoprotection.     Return in about 1 year (around 05/09/2023) for TBSE, Hx BCC.  Graciella Belton, RMA, am acting as scribe for Forest Gleason, MD .  Documentation: I have reviewed the above documentation for accuracy and completeness, and I agree with the above.  Forest Gleason, MD

## 2022-05-08 NOTE — Patient Instructions (Addendum)
Recommend Niacinamide '500mg'$  twice per day for DSAP. [employer]  Alastin Restorative Skin Complex for the face twice daily.   [employer]M[facility] Fragile Skin for neck and body. Can try to use tretinoin from [facility] at neck if tolerated.  Will prescribe [facility] Anti-Aging Tretinoin 0.025%/Niacinamide/Vitamin C/Vitamin E/Turmeric/Resveratrol with Hyaluronic Acid. Apply pea sized amount nightly to the entire face.  The patient was advised this is not covered by insurance since it is made by a compounding pharmacy. They will receive an email to check out and the medication will be mailed to their home.   Topical retinoid medications like tretinoin can cause dryness and irritation when first started. Only apply a pea-sized amount to the entire affected area. Avoid applying it around the eyes, edges of mouth and creases at the nose. If you experience irritation, use a good moisturizer first and/or apply the medicine less often. If you are doing well with the medicine, you can increase how often you use it until you are applying every night. Be careful with sun protection while using this medication as it can make you sensitive to the sun. This medicine should not be used by pregnant women.   Instructions for [facility] Medications  One or more of your medications was sent to the [facility] mail order compounding pharmacy. You will receive an email from them and can purchase the medicine through that link. It will then be mailed to your home at the address you confirmed. If for any reason you do not receive an email from them, please check your spam folder. If you still do not find the email, please let us know. [facility] phone number is [phone].   Recommend taking Heliocare sun protection supplement daily in sunny weather for additional sun protection. For maximum protection on the sunniest days, you can take up to 2 capsules of regular Heliocare OR take 1 capsule of Heliocare  Ultra. For prolonged exposure (such as a full day in the sun), you can repeat your dose of the supplement 4 hours after your first dose. Heliocare can be purchased at [employer], at some [facility] or at [email or web address].    Melanoma ABCDEs  Melanoma is the most dangerous type of skin cancer, and is the leading cause of death from skin disease.  You are more likely to develop melanoma if you: Have light-colored skin, light-colored eyes, or red or blond hair Spend a lot of time in the sun Tan regularly, either outdoors or in a tanning bed Have had blistering sunburns, especially during childhood Have a close family member who has had a melanoma Have atypical moles or large birthmarks  Early detection of melanoma is key since treatment is typically straightforward and cure rates are extremely high if we catch it early.   The first sign of melanoma is often a change in a mole or a new dark spot.  The ABCDE system is a way of remembering the signs of melanoma.  A for asymmetry:  The two halves do not match. B for border:  The edges of the growth are irregular. C for color:  A mixture of colors are present instead of an even brown color. D for diameter:  Melanomas are usually (but not always) greater than 64m - the size of a pencil eraser. E for evolution:  The spot keeps changing in size, shape, and color.  Please check your skin once per month between visits. You can use a small mirror in front and a  large mirror behind you to keep an eye on the back side or your body.   If you see any new or changing lesions before your next follow-up, please call to schedule a visit.  Please continue daily skin protection including broad spectrum sunscreen SPF 30+ to sun-exposed areas, reapplying every 2 hours as needed when you're outdoors.    Due to recent changes in healthcare laws, you may see results of your pathology and/or laboratory studies on MyChart before the doctors have had a chance  to review them. We understand that in some cases there may be results that are confusing or concerning to you. Please understand that not all results are received at the same time and often the doctors may need to interpret multiple results in order to provide you with the best plan of care or course of treatment. Therefore, we ask that you please give [place] 2 business days to thoroughly review all your results before contacting the office for clarification. Should we see a critical lab result, you will be contacted sooner.   If You Need Anything After Your Visit  If you have any questions or concerns for your doctor, please call our main line at [phone] and press option 4 to reach your doctor's medical assistant. If no one answers, please leave a voicemail as directed and we will return your call as soon as possible. Messages left after 4 pm will be answered the following business day.   You may also send [place] a message via [place]. We typically respond to MyChart messages within 1-2 business days.  For prescription refills, please ask your pharmacy to contact our office. Our fax number is [phone].  If you have an urgent issue when the clinic is closed that cannot wait until the next business day, you can page your doctor at the number below.    Please note that while we do our best to be available for urgent issues outside of office hours, we are not available 24/7.   If you have an urgent issue and are unable to reach [place], you may choose to seek medical care at your doctor's office, retail clinic, urgent care center, or emergency room.  If you have a medical emergency, please immediately call 911 or go to the emergency department.  Pager Numbers  - Dr. Nehemiah Massed: [phone]  - Dr. Laurence Ferrari: [phone]  - Dr. Nicole Kindred: [phone]  In the event of inclement weather, please call our main line at [phone] for an update on the status of any delays or closures.  Dermatology  Medication Tips: Please keep the boxes that topical medications come in in order to help keep track of the instructions about where and how to use these. Pharmacies typically print the medication instructions only on the boxes and not directly on the medication tubes.   If your medication is too expensive, please contact our office at [phone] option 4 or send [place] a message through [place].   We are unable to tell what your co-pay for medications will be in advance as this is different depending on your insurance coverage. However, we may be able to find a substitute medication at lower cost or fill out paperwork to get insurance to cover a needed medication.   If a prior authorization is required to get your medication covered by your insurance company, please allow [place] 1-2 business days to complete this process.  Drug prices often vary depending on where the prescription is filled and some pharmacies may offer  cheaper prices.  The website www.goodrx.com contains coupons for medications through different pharmacies. The prices here do not account for what the cost may be with help from insurance (it may be cheaper with your insurance), but the website can give you the price if you did not use any insurance.  - You can print the associated coupon and take it with your prescription to the pharmacy.  - You may also stop by our office during regular business hours and pick up a GoodRx coupon card.  - If you need your prescription sent electronically to a different pharmacy, notify our office through [facility] or by phone at [phone] option 4.     Si Usted Necesita Algo Despus de Su Visita  Tambin puede enviarnos un mensaje a travs de Pharmacist, community. Por lo general respondemos a los mensajes de MyChart en el transcurso de 1 a 2 das hbiles.  Para renovar recetas, por favor pida a su farmacia que se ponga en contacto con nuestra oficina. Harland Dingwall de fax es [employer] [phone].  Si  tiene un asunto urgente cuando la clnica est cerrada y que no puede esperar hasta el siguiente da hbil, puede llamar/localizar a su doctor(a) al nmero que aparece a continuacin.   Por favor, tenga en cuenta que aunque hacemos todo lo posible para estar disponibles para asuntos urgentes fuera del horario de [place], no estamos disponibles las 24 horas del da, los 7 das de la [place].   Si tiene un problema urgente y no puede comunicarse con nosotros, puede optar por buscar atencin mdica  en el consultorio de su doctor(a), en una clnica privada, en un centro de atencin urgente o en una sala de emergencias.  Si tiene Engineering geologist, por favor llame inmediatamente al 911 o vaya a la sala de emergencias.  Nmeros de bper  - Dr. Nehemiah Massed: [phone]  - Dra. Moye: [phone]  - Dra. Nicole Kindred: [phone]  En caso de inclemencias del [place], por favor llame a Johnsie Kindred principal al [phone] para una actualizacin sobre el [place] de cualquier retraso o cierre.  Consejos para la medicacin en dermatologa: Por favor, guarde las cajas en las que vienen los medicamentos de uso tpico para ayudarle a seguir las instrucciones sobre dnde y cmo usarlos. Las farmacias generalmente imprimen las instrucciones del medicamento slo en las cajas y no directamente en los tubos del [place].   Si su medicamento es muy caro, por favor, pngase en contacto con Zigmund Daniel llamando al [phone] y presione la opcin 4 o envenos un mensaje a travs de Pharmacist, community.   No podemos decirle cul ser su copago por los medicamentos por adelantado ya que esto es diferente dependiendo de la cobertura de su seguro. Sin embargo, es posible que podamos encontrar un medicamento sustituto a Electrical engineer un formulario para que el seguro cubra el medicamento que se considera necesario.   Si se requiere una autorizacin previa para que su compaa de seguros [place] su medicamento, por favor  permtanos de 1 a 2 das hbiles para completar este proceso.  Los precios de los medicamentos varan con frecuencia dependiendo del Environmental consultant de dnde se surte la receta y alguna farmacias pueden ofrecer precios ms baratos.  El sitio web www.goodrx.com tiene cupones para medicamentos de Airline pilot. Los precios aqu no tienen en cuenta lo que podra costar con la ayuda del seguro (puede ser ms barato con su seguro), pero el sitio web puede darle el precio si no utiliz ningn  seguro.  - Puede imprimir el cupn correspondiente y llevarlo con su receta a la farmacia.  - Tambin puede pasar por nuestra oficina durante el horario de atencin regular y Charity fundraiser una tarjeta de cupones de GoodRx.  - Si necesita que su receta se enve electrnicamente a una farmacia diferente, informe a nuestra oficina a travs de MyChart de [place] o por telfono llamando al [phone] y presione la opcin 4.

## 2022-05-16 ENCOUNTER — Encounter: Payer: Self-pay | Admitting: Dermatology

## 2022-07-20 ENCOUNTER — Other Ambulatory Visit: Payer: Self-pay | Admitting: Internal Medicine

## 2022-07-23 ENCOUNTER — Encounter: Payer: Self-pay | Admitting: Emergency Medicine

## 2022-07-23 ENCOUNTER — Ambulatory Visit: Payer: BC Managed Care – PPO | Admitting: Emergency Medicine

## 2022-07-23 VITALS — BP 110/70 | HR 68 | Temp 98.1°F | Ht 62.0 in | Wt 137.0 lb

## 2022-07-23 DIAGNOSIS — N390 Urinary tract infection, site not specified: Secondary | ICD-10-CM | POA: Diagnosis not present

## 2022-07-23 DIAGNOSIS — R300 Dysuria: Secondary | ICD-10-CM | POA: Diagnosis not present

## 2022-07-23 DIAGNOSIS — Z87440 Personal history of urinary (tract) infections: Secondary | ICD-10-CM | POA: Diagnosis not present

## 2022-07-23 LAB — POC URINALSYSI DIPSTICK (AUTOMATED)
Bilirubin, UA: NEGATIVE
Blood, UA: NEGATIVE
Glucose, UA: NEGATIVE
Ketones, UA: NEGATIVE
Nitrite, UA: NEGATIVE
Protein, UA: NEGATIVE
Spec Grav, UA: 1.02 (ref 1.010–1.025)
Urobilinogen, UA: 0.2 E.U./dL
pH, UA: 6 (ref 5.0–8.0)

## 2022-07-23 MED ORDER — CEFUROXIME AXETIL 250 MG PO TABS: 250.0000 mg | ORAL_TABLET | Freq: Two times a day (BID) | ORAL | 0 refills | Status: AC

## 2022-07-23 NOTE — Patient Instructions (Signed)
Urinary Tract Infection, Adult A urinary tract infection (UTI) is an infection of any part of the urinary tract. The urinary tract includes: The kidneys. The ureters. The bladder. The urethra. These organs make, store, and get rid of pee (urine) in the body. What are the causes? This infection is caused by germs (bacteria) in your genital area. These germs grow and cause swelling (inflammation) of your urinary tract. What increases the risk? The following factors may make you more likely to develop this condition: Using a small, thin tube (catheter) to drain pee. Not being able to control when you pee or poop (incontinence). Being female. If you are female, these things can increase the risk: Using these methods to prevent pregnancy: A medicine that kills sperm (spermicide). A device that blocks sperm (diaphragm). Having low levels of a female hormone (estrogen). Being pregnant. You are more likely to develop this condition if: You have genes that add to your risk. You are sexually active. You take antibiotic medicines. You have trouble peeing because of: A prostate that is bigger than normal, if you are female. A blockage in the part of your body that drains pee from the bladder. A kidney stone. A nerve condition that affects your bladder. Not getting enough to drink. Not peeing often enough. You have other conditions, such as: Diabetes. A weak disease-fighting system (immune system). Sickle cell disease. Gout. Injury of the spine. What are the signs or symptoms? Symptoms of this condition include: Needing to pee right away. Peeing small amounts often. Pain or burning when peeing. Blood in the pee. Pee that smells bad or not like normal. Trouble peeing. Pee that is cloudy. Fluid coming from the vagina, if you are female. Pain in the belly or lower back. Other symptoms include: Vomiting. Not feeling hungry. Feeling mixed up (confused). This may be the first symptom in  older adults. Being tired and grouchy (irritable). A fever. Watery poop (diarrhea). How is this treated? Taking antibiotic medicine. Taking other medicines. Drinking enough water. In some cases, you may need to see a specialist. Follow these instructions at home:  Medicines Take over-the-counter and prescription medicines only as told by your doctor. If you were prescribed an antibiotic medicine, take it as told by your doctor. Do not stop taking it even if you start to feel better. General instructions Make sure you: Pee until your bladder is empty. Do not hold pee for a long time. Empty your bladder after sex. Wipe from front to back after peeing or pooping if you are a female. Use each tissue one time when you wipe. Drink enough fluid to keep your pee pale yellow. Keep all follow-up visits. Contact a doctor if: You do not get better after 1-2 days. Your symptoms go away and then come back. Get help right away if: You have very bad back pain. You have very bad pain in your lower belly. You have a fever. You have chills. You feeling like you will vomit or you vomit. Summary A urinary tract infection (UTI) is an infection of any part of the urinary tract. This condition is caused by germs in your genital area. There are many risk factors for a UTI. Treatment includes antibiotic medicines. Drink enough fluid to keep your pee pale yellow. This information is not intended to replace advice given to you by your health care provider. Make sure you discuss any questions you have with your health care provider. Document Revised: 12/03/2019 Document Reviewed: 12/03/2019 Elsevier Patient Education    2023 Elsevier Inc.  

## 2022-07-23 NOTE — Progress Notes (Signed)
Sheila [name] 57 y.o.   Chief Complaint  Patient presents with   Dysuria    2-3 days having a smell, and burning when urinating     HISTORY OF PRESENT ILLNESS: Acute problem visit today.  Patient of Dr. Pricilla Holm This is a 57 y.o. female complaining of UTI symptoms for 2 to 3 days Smelly urine with burning on urination Denies fever or chills.  Denies nausea or vomiting.  Denies flank pain Otherwise doing well. Has history of recurrent urinary tract infections  Dysuria  Pertinent negatives include no chills, flank pain, hematuria, nausea or vomiting.     Prior to Admission medications   Medication Sig Start Date End Date Taking? Authorizing Provider  cholecalciferol (VITAMIN D3) 25 MCG (1000 UT) tablet Take 1,000 Units by mouth daily.   Yes [provider]  ciclopirox (LOPROX) 0.77 % cream Apply topically 2 (two) times daily. For 6 weeks 05/08/22  Yes Moye, [place], MD  conjugated estrogens (PREMARIN) vaginal cream Place 1 applicator vaginally daily. 07/11/17  Yes [provider]  D-MANNOSE PO Take 1 tablet by mouth daily. For prevention of UTIs   Yes [provider]  Dextromethorphan-Bupropion (AUVELITY PO) Take by mouth.   Yes [provider]  estradiol (ESTRACE) 0.1 MG/GM vaginal cream Place 1/2 g vaginally two or three times per week as needed to maintain symptom relief. 12/19/21  Yes Amundson Raliegh Ip, MD  ibuprofen (ADVIL) 800 MG tablet TAKE 1 TABLET BY MOUTH EVERY 8 HOURS AS NEEDED 01/31/21  Yes Hoyt Koch, MD  influenza vac split quadrivalent PF (FLUARIX) 0.5 ML injection Inject 0.5 mLs into the muscle once. 04/16/19  Yes [provider]  Multiple Vitamins-Minerals (MULTIVITAMIN WOMEN PO) Take 1 tablet by mouth daily.   Yes [provider]  nitrofurantoin (MACRODANTIN) 50 MG capsule Take 50 mg by mouth as needed. 05/19/19  Yes [provider]  pantoprazole (PROTONIX) 40 MG tablet TAKE 1  TABLET BY MOUTH EVERY DAY 07/22/22  Yes Hoyt Koch, MD  sulfamethoxazole-trimethoprim (BACTRIM DS) 800-160 MG tablet Take 1 tablet by mouth as needed. 05/19/19  Yes [provider]  Vibegron (GEMTESA) 75 MG TABS Take 75 mg by mouth daily. 01/24/22  Yes Nunzio Cobbs, MD  Zoster Vaccine Adjuvanted [facility]) injection Inject 0.5 mLs into the muscle once. 04/16/19  Yes [provider]    No Known Allergies  Patient Active Problem List   Diagnosis Date Noted   Periodic limb movements of sleep 03/23/2019   Sleep-disordered breathing 12/28/2018   Sleep apnea 12/20/2018   Ureteral stone with hydronephrosis 08/05/2018   Vaginal adhesions 07/28/2017   Female bladder prolapse 07/28/2017   Chronic pain of left knee 07/18/2016   Pre-diabetes 12/05/2015   Chronic pansinusitis 09/15/2015   Deviated septum 09/15/2015   Hypertrophy, nasal, turbinate 09/15/2015   IBS (irritable bowel syndrome) 06/15/2013   Migraine headache 02/21/2013   Depression 10/13/2012   GERD (gastroesophageal reflux disease) 03/23/2007   HYPERCHOLESTEROLEMIA, PURE 10/14/2006   GAD (generalized anxiety disorder) 10/13/2006   STRESS INCONTINENCE 10/13/2006    Past Medical History:  Diagnosis Date   Acne    Allergy    allergic rhinitis   Anxiety    Bladder stone 2018   Depression    Elevated hemoglobin A1c 2017   5.9   GERD (gastroesophageal reflux disease)    Hyperlipidemia    Kidney stone    Low serum vitamin D 2017   15  Skin cancer 2009   possible BCC tx's in [place] at back > 15 years ago    Past Surgical History:  Procedure Laterality Date   ABDOMINAL HYSTERECTOMY  07/11/2009    laparosccopically assisted vaginal hysterectomy with anterior and posterior colporrhaphy, transobturator Monarch sling, cystoscopy/ovaries remain   APPENDECTOMY     BLADDER SURGERY  07/2007   cystocele, pessary   bladder tack     2nd bladder surgery   CHOLECYSTECTOMY  2015   COLLAGEN  INJECTION  07/07/2008   Periurethral injection - Dr. Matilde Sprang   CYST REMOVAL HAND     EXTRACORPOREAL SHOCK WAVE LITHOTRIPSY Right 08/06/2018   Procedure: EXTRACORPOREAL SHOCK WAVE LITHOTRIPSY (ESWL);  Surgeon: Billey Co, MD;  Location: [facility] ORS;  Service: Urology;  Laterality: Right;   INCONTINENCE SURGERY  08/04/2007   Spark miduethral sling, cystoscopy - Dr. Matilde Sprang   NASAL SINUS SURGERY  09/2015   Dr.Byers    Social History   Socioeconomic History   Marital status: Married    Spouse name: Richard Print production planner"   Number of children: Not on file   Years of education: Not on file   Highest education level: Not on file  Occupational History    Comment: retired  Tobacco Use   Smoking status: Never   Smokeless tobacco: Never  Vaping Use   Vaping Use: Never used  Substance and Sexual Activity   Alcohol use: No    Alcohol/week: 0.0 standard drinks of alcohol   Drug use: No   Sexual activity: Yes    Partners: Male    Birth control/protection: Surgical    Comment: TVH--ovaries remain  Other Topics Concern   Not on file  Social History Narrative   Lives with husband   Caffeine use: 24-32 oz per day    [place]   2 kids   Married 1989   Social Determinants of Health   Financial Resource Strain: Not on file  Food Insecurity: Not on file  Transportation Needs: Not on file  Physical Activity: Not on file  Stress: Not on file  Social Connections: Not on file  Intimate Partner Violence: Not on file    Family History  Problem Relation Age of Onset   Diabetes Father    Diabetes Maternal Grandmother    Uterine cancer Paternal Aunt    Breast cancer Neg Hx    Colon cancer Neg Hx      Review of Systems  Constitutional: Negative.  Negative for chills and fever.  HENT: Negative.  Negative for congestion and sore throat.   Respiratory: Negative.  Negative for cough and shortness of breath.   Cardiovascular: Negative.  Negative for chest pain  and palpitations.  Gastrointestinal:  Negative for abdominal pain, nausea and vomiting.  Genitourinary:  Positive for dysuria. Negative for flank pain and hematuria.  Skin: Negative.  Negative for rash.  Neurological: Negative.  Negative for dizziness and headaches.  All other systems reviewed and are negative.   Vitals:   07/23/22 0837  BP: 110/70  Pulse: 68  Temp: 98.1 F (36.7 C)  SpO2: 98%    Physical Exam Vitals reviewed.  Constitutional:      Appearance: Normal appearance.  HENT:     Head: Normocephalic.  Eyes:     Extraocular Movements: Extraocular movements intact.  Cardiovascular:     Rate and Rhythm: Normal rate.  Pulmonary:     Effort: Pulmonary effort is normal.  Abdominal:     Palpations: Abdomen is soft.  Tenderness: There is no abdominal tenderness. There is no right CVA tenderness or left CVA tenderness.  Skin:    General: Skin is warm and dry.  Neurological:     Mental Status: She is alert and oriented to person, place, and time.  Psychiatric:        Mood and Affect: Mood normal.        Behavior: Behavior normal.    Results for orders placed or performed in visit on 07/23/22 (from the past 24 hour(s))  POCT Urinalysis Dipstick (Automated)     Status: Abnormal   Collection Time: 07/23/22  9:56 AM  Result Value Ref Range   Color, UA yellow    Clarity, UA clear    Glucose, UA Negative Negative   Bilirubin, UA negative    Ketones, UA negative    Spec Grav, UA 1.020 1.010 - 1.025   Blood, UA negative    pH, UA 6.0 5.0 - 8.0   Protein, UA Negative Negative   Urobilinogen, UA 0.2 0.2 or 1.0 E.U./dL   Nitrite, UA negative    Leukocytes, UA Trace (A) Negative     ASSESSMENT & PLAN: Problem List Items Addressed This Visit       Genitourinary   Acute UTI - Primary    Positive urinalysis.  Sent for culture. History of recurring UTIs. Recommend to stay well-hydrated Start antibiotic Ceftin 250 mg twice a day for 7 days ED precautions  given Advised to contact the office if no better or worse during the next several days.      Relevant Medications   nitrofurantoin (MACRODANTIN) 50 MG capsule   sulfamethoxazole-trimethoprim (BACTRIM DS) 800-160 MG tablet   influenza vac split quadrivalent PF (FLUARIX) 0.5 ML injection   Zoster Vaccine Adjuvanted [facility]) injection   cefUROXime (CEFTIN) 250 MG tablet   Other Relevant Orders   POCT Urinalysis Dipstick (Automated)   Urine Culture     Other   Dysuria    Secondary to UTI. Advised to stay well-hydrated Start antibiotics.  May take Azo over-the-counter for burning      Relevant Orders   POCT Urinalysis Dipstick (Automated)   Urine Culture   History of recurrent UTIs    No recent positive culture available to check on organisms and sensitivity      Relevant Orders   POCT Urinalysis Dipstick (Automated)   Urine Culture   Patient Instructions  Urinary Tract Infection, Adult A urinary tract infection (UTI) is an infection of any part of the urinary tract. The urinary tract includes: The kidneys. The ureters. The bladder. The urethra. These organs make, store, and get rid of pee (urine) in the body. What are the causes? This infection is caused by germs (bacteria) in your genital area. These germs grow and cause swelling (inflammation) of your urinary tract. What increases the risk? The following factors may make you more likely to develop this condition: Using a small, thin tube (catheter) to drain pee. Not being able to control when you pee or poop (incontinence). Being female. If you are female, these things can increase the risk: Using these methods to prevent pregnancy: A medicine that kills sperm (spermicide). A device that blocks sperm (diaphragm). Having low levels of a female hormone (estrogen). Being pregnant. You are more likely to develop this condition if: You have genes that add to your risk. You are sexually active. You take antibiotic  medicines. You have trouble peeing because of: A prostate that is bigger than normal, if you  are female. A blockage in the part of your body that drains pee from the bladder. A kidney stone. A nerve condition that affects your bladder. Not getting enough to drink. Not peeing often enough. You have other conditions, such as: Diabetes. A weak disease-fighting system (immune system). Sickle cell disease. Gout. Injury of the spine. What are the signs or symptoms? Symptoms of this condition include: Needing to pee right away. Peeing small amounts often. Pain or burning when peeing. Blood in the pee. Pee that smells bad or not like normal. Trouble peeing. Pee that is cloudy. Fluid coming from the vagina, if you are female. Pain in the belly or lower back. Other symptoms include: Vomiting. Not feeling hungry. Feeling mixed up (confused). This may be the first symptom in older adults. Being tired and grouchy (irritable). A fever. Watery poop (diarrhea). How is this treated? Taking antibiotic medicine. Taking other medicines. Drinking enough water. In some cases, you may need to see a specialist. Follow these instructions at home:  Medicines Take over-the-counter and prescription medicines only as told by your doctor. If you were prescribed an antibiotic medicine, take it as told by your doctor. Do not stop taking it even if you start to feel better. General instructions Make sure you: Pee until your bladder is empty. Do not hold pee for a long time. Empty your bladder after sex. Wipe from front to back after peeing or pooping if you are a female. Use each tissue one time when you wipe. Drink enough fluid to keep your pee pale yellow. Keep all follow-up visits. Contact a doctor if: You do not get better after 1-2 days. Your symptoms go away and then come back. Get help right away if: You have very bad back pain. You have very bad pain in your lower belly. You have a  fever. You have chills. You feeling like you will vomit or you vomit. Summary A urinary tract infection (UTI) is an infection of any part of the urinary tract. This condition is caused by germs in your genital area. There are many risk factors for a UTI. Treatment includes antibiotic medicines. Drink enough fluid to keep your pee pale yellow. This information is not intended to replace advice given to you by your health care provider. Make sure you discuss any questions you have with your health care provider. Document Revised: 12/03/2019 Document Reviewed: 12/03/2019 Elsevier Patient Education  [place], MD [facility]

## 2022-07-23 NOTE — Assessment & Plan Note (Signed)
No recent positive culture available to check on organisms and sensitivity

## 2022-07-23 NOTE — Assessment & Plan Note (Signed)
Secondary to UTI. Advised to stay well-hydrated Start antibiotics.  May take Azo over-the-counter for burning

## 2022-07-23 NOTE — Assessment & Plan Note (Signed)
Positive urinalysis.  Sent for culture. History of recurring UTIs. Recommend to stay well-hydrated Start antibiotic Ceftin 250 mg twice a day for 7 days ED precautions given Advised to contact the office if no better or worse during the next several days.

## 2022-07-24 LAB — URINE CULTURE

## 2022-08-07 ENCOUNTER — Encounter: Payer: Self-pay | Admitting: Dermatology

## 2022-08-07 MED ORDER — BIMATOPROST 0.03 % EX SOLN: CUTANEOUS | 12 refills | Status: DC

## 2022-08-07 NOTE — Addendum Note (Signed)
Addended by: Rudell Cobb A on: 08/07/2022 05:07 PM   Modules accepted: Orders

## 2022-08-07 NOTE — Telephone Encounter (Signed)
Okay to send in to pharmacy.  Please review side effects. I also recommend using the goodrx coupon card to get the best price.   Bimatoprost (latisse) can rarely cause permanent darkening of the iris (colored part) of the eye. It can also irritation or darkening of the skin of the eyelids. It can also cause loss of fat in the fat pad around the eye appears to be mostly reversible if the medication is stopped. It is not recommended during pregnancy.  Thank you!

## 2022-10-16 ENCOUNTER — Other Ambulatory Visit: Payer: Self-pay | Admitting: Obstetrics and Gynecology

## 2022-10-18 NOTE — Telephone Encounter (Addendum)
Med refill request:estradiol vag cream Last AEX: 12/19/21 -BS Next AEX:12/25/22 -BS Last MMG (if hormonal med) 01/31/22 BiRads 1 neg  Rx refused. Rx refused. Rx sent 12/19/21 with 2 RF.   Rx refused.   Routing to covering provider FYI.   Encounter closed.

## 2022-12-09 ENCOUNTER — Other Ambulatory Visit: Payer: Self-pay | Admitting: Obstetrics and Gynecology

## 2022-12-10 NOTE — Telephone Encounter (Signed)
Medication refill request: Gemtesa 75mg  Last AEX:  12-19-21 Next AEX: 12-25-22 Last MMG (if hormonal medication request): 01-31-22 category c density birads 1:neg Refill authorized: please approve if appropriate

## 2022-12-11 NOTE — Progress Notes (Signed)
57 y.o. [id number] Married Caucasian female here for annual exam.    Occasional pelvic pressure.  Uses a pessary and it is comfortable.  Uses estradiol vaginal cream with pessary cleansing every couple of weeks.  Has irritable bowel and is asking about treatments for this.  Her bowel function is limiting her ability to do activities due to necessity of being near a bathroom.  CT abdomen done 05/03/22 - few right renal calculi.   Gemtesa helps bladder control to control urgency. No urinary incontinence.    Taking care of her parents, who are in their 66s.  Husband is retired.   PCP:   Dr. Okey Dupre  Patient's last menstrual period was 05/06/2009.           Sexually active: No.  The current method of family planning is status post hysterectomy.    Exercising: No.     Smoker:  no  Health Maintenance: Pap:  08-01-14 Neg:Neg HR HPV, 09/27/10 neg.  Hysterectomy 07/12/19 - benign cervix.  History of abnormal Pap:  yes, 2006 hx of LEEP MMG:  01/31/22 Breast Density Cat C, BI-RADS CAT 1 neg Colonoscopy:  07/16/17.  Due in 2029.  Dr. Loreta Ave.  BMD:   n/a  Result  n/a TDaP:  08/01/14 Gardasil:   no HIV: 08/17/15 NR Hep C: 08/17/15 neg Screening Labs:  PCP   reports that she has never smoked. She has never used smokeless tobacco. She reports that she does not drink alcohol and does not use drugs.  Past Medical History:  Diagnosis Date   Acne    Allergy    allergic rhinitis   Anxiety    Bladder stone 2018   Depression    Elevated hemoglobin A1c 2017   5.9   GERD (gastroesophageal reflux disease)    Hyperlipidemia    Kidney stone    Low serum vitamin D 2017   15   Skin cancer 2009   possible BCC tx's in [place] at back > 15 years ago    Past Surgical History:  Procedure Laterality Date   ABDOMINAL HYSTERECTOMY  07/11/2009    laparosccopically assisted vaginal hysterectomy with anterior and posterior colporrhaphy, transobturator Monarch sling, cystoscopy/ovaries remain   APPENDECTOMY      BLADDER SURGERY  07/2007   cystocele, pessary   bladder tack     2nd bladder surgery   CHOLECYSTECTOMY  2015   COLLAGEN INJECTION  07/07/2008   Periurethral injection - Dr. Sherron Monday   CYST REMOVAL HAND     EXTRACORPOREAL SHOCK WAVE LITHOTRIPSY Right 08/06/2018   Procedure: EXTRACORPOREAL SHOCK WAVE LITHOTRIPSY (ESWL);  Surgeon: Sondra Come, MD;  Location: [facility] ORS;  Service: Urology;  Laterality: Right;   INCONTINENCE SURGERY  08/04/2007   Spark miduethral sling, cystoscopy - Dr. Sherron Monday   NASAL SINUS SURGERY  09/2015   Dr.Byers    Current Outpatient Medications  Medication Sig Dispense Refill   bimatoprost (LATISSE) 0.03 % ophthalmic solution Place one drop on applicator and apply evenly along the skin of the upper eyelid at base of eyelashes once daily at bedtime; repeat procedure for second eye (use a clean applicator). 3 mL 12   cholecalciferol (VITAMIN D3) 25 MCG (1000 UT) tablet Take 1,000 Units by mouth daily.     ciclopirox (LOPROX) 0.77 % cream Apply topically 2 (two) times daily. For 6 weeks 90 g 2   conjugated estrogens (PREMARIN) vaginal cream Place 1 applicator vaginally daily.     D-MANNOSE PO Take 1 tablet by mouth daily.  For prevention of UTIs     Dextromethorphan-Bupropion (AUVELITY PO) Take by mouth.     estradiol (ESTRACE) 0.1 MG/GM vaginal cream Place 1/2 g vaginally two or three times per week as needed to maintain symptom relief. 42.5 g 2   GEMTESA 75 MG TABS TAKE ONE (1) TABLET BY MOUTH EVERY DAY 30 tablet 0   ibuprofen (ADVIL) 800 MG tablet TAKE 1 TABLET BY MOUTH EVERY 8 HOURS AS NEEDED 90 tablet 1   influenza vac split quadrivalent PF (FLUARIX) 0.5 ML injection Inject 0.5 mLs into the muscle once.     Multiple Vitamins-Minerals (MULTIVITAMIN WOMEN PO) Take 1 tablet by mouth daily.     nitrofurantoin (MACRODANTIN) 50 MG capsule Take 50 mg by mouth as needed.     pantoprazole (PROTONIX) 40 MG tablet TAKE 1 TABLET BY MOUTH EVERY DAY 90 tablet 3    sulfamethoxazole-trimethoprim (BACTRIM DS) 800-160 MG tablet Take 1 tablet by mouth as needed.     Zoster Vaccine Adjuvanted [facility]) injection Inject 0.5 mLs into the muscle once.     No current facility-administered medications for this visit.    Family History  Problem Relation Age of Onset   Diabetes Father    Diabetes Maternal Grandmother    Uterine cancer Paternal Aunt    Breast cancer Neg Hx    Colon cancer Neg Hx     Review of Systems  All other systems reviewed and are negative.   Exam:   BP 124/84 (BP Location: Left Arm, Patient Position: Sitting, Cuff Size: Normal)   Pulse 70   Ht 5' 2.5" (1.588 m)   Wt 138 lb (62.6 kg)   LMP 05/06/2009   SpO2 99%   BMI 24.84 kg/m     General appearance: alert, cooperative and appears stated age Head: normocephalic, without obvious abnormality, atraumatic Neck: no adenopathy, supple, symmetrical, trachea midline and thyroid normal to inspection and palpation Lungs: clear to auscultation bilaterally Breasts: normal appearance, no masses or tenderness, No nipple retraction or dimpling, No nipple discharge or bleeding, No axillary adenopathy Heart: regular rate and rhythm Abdomen: soft, non-tender; no masses, no organomegaly Extremities: extremities normal, atraumatic, no cyanosis or edema Skin: skin color, texture, turgor normal. No rashes or lesions Lymph nodes: cervical, supraclavicular, and axillary nodes normal. Neurologic: grossly normal  Pelvic: External genitalia:  no lesions              No abnormal inguinal nodes palpated.              Urethra:  normal appearing urethra with no masses, tenderness or lesions              Bartholins and Skenes: normal                 Vagina: normal appearing vagina with normal color and discharge, no lesions              Cervix: absent              Pap taken: no Bimanual Exam:  Uterus:  absent              Adnexa: no mass, fullness, tenderness              Rectal exam: yes.   Confirms.              Anus:  normal sphincter tone, no lesions  Pessary removed, cleansed, and replaced.   Chaperone was present for exam:  Warren Lacy, CMA  Assessment:  Well woman visit with gynecologic exam. Status post TAH.  Ovaries remain.  Cystocele. Treated with pessary.  Pessary maintenance. Vaginal atrophy.  Improved with vaginal estrogen cream.  Overactive bladder.  On Gemtesa.  Hx recurrent UTIs.  Renal stones.  Hx elevated hemoglobin A1C.    Hx hyperlipidemia. Hx depression and anxiety.  Irritable bowel.   Plan: Mammogram screening discussed. Self breast awareness reviewed. Pap and HR HPV not indicated.  Guidelines for Calcium, Vitamin D, regular exercise program including cardiovascular and weight bearing exercise. Refill vaginal estradiol cream.  I discussed potential effect on breast cancer.  Refill Gemtesa.  Continue pessary use.  She will make an appointment to see her GI.  Follow up annually and prn.

## 2022-12-19 ENCOUNTER — Other Ambulatory Visit: Payer: Self-pay | Admitting: Obstetrics and Gynecology

## 2022-12-19 DIAGNOSIS — Z1231 Encounter for screening mammogram for malignant neoplasm of breast: Secondary | ICD-10-CM

## 2022-12-25 ENCOUNTER — Ambulatory Visit (INDEPENDENT_AMBULATORY_CARE_PROVIDER_SITE_OTHER): Payer: BC Managed Care – PPO | Admitting: Obstetrics and Gynecology

## 2022-12-25 ENCOUNTER — Encounter: Payer: Self-pay | Admitting: Obstetrics and Gynecology

## 2022-12-25 VITALS — BP 124/84 | HR 70 | Ht 62.5 in | Wt 138.0 lb

## 2022-12-25 DIAGNOSIS — Z01419 Encounter for gynecological examination (general) (routine) without abnormal findings: Secondary | ICD-10-CM | POA: Diagnosis not present

## 2022-12-25 DIAGNOSIS — Z4689 Encounter for fitting and adjustment of other specified devices: Secondary | ICD-10-CM | POA: Diagnosis not present

## 2022-12-25 MED ORDER — ESTRADIOL 0.1 MG/GM VA CREA: TOPICAL_CREAM | VAGINAL | 3 refills | Status: DC

## 2022-12-25 MED ORDER — GEMTESA 75 MG PO TABS: 75.0000 mg | ORAL_TABLET | Freq: Every day | ORAL | 3 refills | Status: DC

## 2022-12-25 NOTE — Patient Instructions (Signed)

## 2022-12-26 ENCOUNTER — Encounter: Payer: Self-pay | Admitting: Internal Medicine

## 2023-02-03 ENCOUNTER — Ambulatory Visit
Admission: RE | Admit: 2023-02-03 | Discharge: 2023-02-03 | Disposition: A | Discharge status: Home / Self Care | Payer: BC Managed Care – PPO | Source: Ambulatory Visit | Attending: Obstetrics and Gynecology | Admitting: Obstetrics and Gynecology

## 2023-02-03 DIAGNOSIS — Z1231 Encounter for screening mammogram for malignant neoplasm of breast: Secondary | ICD-10-CM | POA: Diagnosis present

## 2023-02-10 ENCOUNTER — Encounter: Payer: Self-pay | Admitting: Internal Medicine

## 2023-02-10 ENCOUNTER — Ambulatory Visit: Payer: BC Managed Care – PPO | Admitting: Internal Medicine

## 2023-02-10 VITALS — BP 114/80 | HR 74 | Temp 98.2°F | Ht 62.5 in | Wt 139.0 lb

## 2023-02-10 DIAGNOSIS — K582 Mixed irritable bowel syndrome: Secondary | ICD-10-CM | POA: Diagnosis not present

## 2023-02-10 DIAGNOSIS — E7800 Pure hypercholesterolemia, unspecified: Secondary | ICD-10-CM | POA: Diagnosis not present

## 2023-02-10 DIAGNOSIS — Z0000 Encounter for general adult medical examination without abnormal findings: Secondary | ICD-10-CM

## 2023-02-10 DIAGNOSIS — Z87440 Personal history of urinary (tract) infections: Secondary | ICD-10-CM

## 2023-02-10 DIAGNOSIS — R7303 Prediabetes: Secondary | ICD-10-CM | POA: Diagnosis not present

## 2023-02-10 DIAGNOSIS — K219 Gastro-esophageal reflux disease without esophagitis: Secondary | ICD-10-CM

## 2023-02-10 LAB — CBC
HCT: 40.2 % (ref 36.0–46.0)
Hemoglobin: 13 g/dL (ref 12.0–15.0)
MCHC: 32.4 g/dL (ref 30.0–36.0)
MCV: 90.9 fL (ref 78.0–100.0)
Platelets: 265 10*3/uL (ref 150.0–400.0)
RBC: 4.43 Mil/uL (ref 3.87–5.11)
RDW: 13.2 % (ref 11.5–15.5)
WBC: 5.7 10*3/uL (ref 4.0–10.5)

## 2023-02-10 LAB — COMPREHENSIVE METABOLIC PANEL
ALT: 15 U/L (ref 0–35)
AST: 19 U/L (ref 0–37)
Albumin: 4.3 g/dL (ref 3.5–5.2)
Alkaline Phosphatase: 96 U/L (ref 39–117)
BUN: 13 mg/dL (ref 6–23)
CO2: 29 meq/L (ref 19–32)
Calcium: 9.5 mg/dL (ref 8.4–10.5)
Chloride: 104 meq/L (ref 96–112)
Creatinine, Ser: 0.86 mg/dL (ref 0.40–1.20)
GFR: 74.52 mL/min (ref >60.00)
Glucose, Bld: 99 mg/dL (ref 70–99)
Potassium: 3.8 meq/L (ref 3.5–5.1)
Sodium: 140 meq/L (ref 135–145)
Total Bilirubin: 0.3 mg/dL (ref 0.2–1.2)
Total Protein: 6.8 g/dL (ref 6.0–8.3)

## 2023-02-10 LAB — TSH: TSH: 0.74 u[IU]/mL (ref 0.35–5.50)

## 2023-02-10 LAB — T4, FREE: Free T4: 0.79 ng/dL (ref 0.60–1.60)

## 2023-02-10 LAB — LIPID PANEL
Cholesterol: 219 mg/dL — ABNORMAL HIGH (ref 0–200)
HDL: 61.8 mg/dL (ref >39.00)
LDL Cholesterol: 126 mg/dL — ABNORMAL HIGH (ref 0–99)
NonHDL: 156.97
Total CHOL/HDL Ratio: 4
Triglycerides: 153 mg/dL — ABNORMAL HIGH (ref 0.0–149.0)
VLDL: 30.6 mg/dL (ref 0.0–40.0)

## 2023-02-10 LAB — HEMOGLOBIN A1C: Hgb A1c MFr Bld: 6 % (ref 4.6–6.5)

## 2023-02-10 NOTE — Progress Notes (Unsigned)
Subjective:   Patient ID: Sheila [name], female    DOB: 06/12/1965, 57 y.o.   MRN: [id number]  HPI The patient is here for physical.  PMH, [facility], social history reviewed and updated  Review of Systems  Constitutional: Negative.   HENT: Negative.    Eyes: Negative.   Respiratory:  Negative for cough, chest tightness and shortness of breath.   Cardiovascular:  Negative for chest pain, palpitations and leg swelling.  Gastrointestinal:  Positive for constipation and diarrhea. Negative for abdominal distention, abdominal pain, nausea and vomiting.  Musculoskeletal: Negative.   Skin: Negative.   Neurological: Negative.   Psychiatric/Behavioral: Negative.      Objective:  Physical Exam Constitutional:      Appearance: She is well-developed.  HENT:     Head: Normocephalic and atraumatic.  Cardiovascular:     Rate and Rhythm: Normal rate and regular rhythm.  Pulmonary:     Effort: Pulmonary effort is normal. No respiratory distress.     Breath sounds: Normal breath sounds. No wheezing or rales.  Abdominal:     General: Bowel sounds are normal. There is no distension.     Palpations: Abdomen is soft.     Tenderness: There is no abdominal tenderness. There is no rebound.  Musculoskeletal:     Cervical back: Normal range of motion.  Skin:    General: Skin is warm and dry.  Neurological:     Mental Status: She is alert and oriented to person, place, and time.     Coordination: Coordination normal.     Vitals:   02/10/23 1317  BP: 114/80  Pulse: 74  Temp: 98.2 F (36.8 C)  TempSrc: Oral  SpO2: 98%  Weight: 139 lb (63 kg)  Height: 5' 2.5" (1.588 m)    Assessment & Plan:

## 2023-02-13 ENCOUNTER — Encounter: Payer: Self-pay | Admitting: Internal Medicine

## 2023-02-13 NOTE — Assessment & Plan Note (Signed)
Flu shot complete. Shingrix complete. Tetanus up to date. Colonoscopy up to date. Mammogram up to date, pap smear up to date. Counseled about sun safety and mole surveillance. Counseled about the dangers of distracted driving. Given 10 year screening recommendations.

## 2023-02-13 NOTE — Assessment & Plan Note (Signed)
Wants referral to GI close to her to discuss IBS treatments. Is up to date on colonoscopy and this is not needed at this time.

## 2023-02-13 NOTE — Assessment & Plan Note (Signed)
Taking gemtessa and still having some symptoms of frequency and urgency.

## 2023-02-13 NOTE — Assessment & Plan Note (Signed)
Taking protonix 40 mg daily and continue.

## 2023-02-13 NOTE — Assessment & Plan Note (Signed)
Checking HgA1c and adjust as needed.  

## 2023-02-13 NOTE — Assessment & Plan Note (Signed)
Checking lipid panel and adjust as needed.  

## 2023-02-17 ENCOUNTER — Other Ambulatory Visit: Payer: Self-pay

## 2023-02-17 MED ORDER — IBUPROFEN 800 MG PO TABS: 800.0000 mg | ORAL_TABLET | Freq: Three times a day (TID) | ORAL | 1 refills | Status: DC | PRN

## 2023-03-24 ENCOUNTER — Other Ambulatory Visit: Payer: Self-pay | Admitting: Internal Medicine

## 2023-04-11 ENCOUNTER — Other Ambulatory Visit: Payer: Self-pay | Admitting: Obstetrics and Gynecology

## 2023-04-11 NOTE — Telephone Encounter (Signed)
Med refill request: Gemtesa Last AEX: 12/25/22 Next AEX: 12/29/23 Last MMG (if hormonal med) 02/03/23 Refill authorized: Please Advise, #90, 3 RF

## 2023-05-08 ENCOUNTER — Encounter: Payer: BC Managed Care – PPO | Admitting: Dermatology

## 2023-05-19 ENCOUNTER — Ambulatory Visit: Payer: 59 | Admitting: Dermatology

## 2023-05-19 DIAGNOSIS — L814 Other melanin hyperpigmentation: Secondary | ICD-10-CM

## 2023-05-19 DIAGNOSIS — L578 Other skin changes due to chronic exposure to nonionizing radiation: Secondary | ICD-10-CM

## 2023-05-19 DIAGNOSIS — Z1283 Encounter for screening for malignant neoplasm of skin: Secondary | ICD-10-CM | POA: Diagnosis not present

## 2023-05-19 DIAGNOSIS — D2262 Melanocytic nevi of left upper limb, including shoulder: Secondary | ICD-10-CM

## 2023-05-19 DIAGNOSIS — L565 Disseminated superficial actinic porokeratosis (DSAP): Secondary | ICD-10-CM

## 2023-05-19 DIAGNOSIS — D1801 Hemangioma of skin and subcutaneous tissue: Secondary | ICD-10-CM

## 2023-05-19 DIAGNOSIS — L820 Inflamed seborrheic keratosis: Secondary | ICD-10-CM | POA: Diagnosis not present

## 2023-05-19 DIAGNOSIS — D229 Melanocytic nevi, unspecified: Secondary | ICD-10-CM

## 2023-05-19 DIAGNOSIS — L988 Other specified disorders of the skin and subcutaneous tissue: Secondary | ICD-10-CM

## 2023-05-19 DIAGNOSIS — L821 Other seborrheic keratosis: Secondary | ICD-10-CM

## 2023-05-19 DIAGNOSIS — W908XXA Exposure to other nonionizing radiation, initial encounter: Secondary | ICD-10-CM | POA: Diagnosis not present

## 2023-05-19 DIAGNOSIS — Z85828 Personal history of other malignant neoplasm of skin: Secondary | ICD-10-CM

## 2023-05-19 NOTE — Progress Notes (Signed)
 Follow-Up Visit   Subjective  Sheila [name] is a 58 y.o. female who presents for the following: Skin Cancer Screening and Full Body Skin Exam, hx of BCC.    The patient presents for Total-Body Skin Exam (TBSE) for skin cancer screening and mole check. The patient has spots, moles and lesions to be evaluated, some may be new or changing and the patient may have concern these could be cancer.  She has several spots to check that are irritated.    The following portions of the chart were reviewed this encounter and updated as appropriate: medications, allergies, medical history  Review of Systems:  No other skin or systemic complaints except as noted in HPI or Assessment and Plan.  Objective  Well appearing patient in no apparent distress; mood and affect are within normal limits.  A full examination was performed including scalp, head, eyes, ears, nose, lips, neck, chest, axillae, abdomen, back, buttocks, bilateral upper extremities, bilateral lower extremities, hands, feet, fingers, toes, fingernails, and toenails. All findings within normal limits unless otherwise noted below.   Relevant physical exam findings are noted in the Assessment and Plan.  left clavicle x 1, left knee x 1, left mandible x 1 (3) Stuck-on, waxy, tan-brown papules with erythema  Assessment & Plan   SKIN CANCER SCREENING PERFORMED TODAY.  ACTINIC DAMAGE - Chronic condition, secondary to cumulative UV/sun exposure - diffuse scaly erythematous macules with underlying dyspigmentation - Recommend daily broad spectrum sunscreen SPF 30+ to sun-exposed areas, reapply every 2 hours as needed.  - Staying in the shade or wearing long sleeves, sun glasses (UVA+UVB protection) and wide brim hats (4-inch brim around the entire circumference of the hat) are also recommended for sun protection.  - Call for new or changing lesions.  LENTIGINES, SEBORRHEIC KERATOSES, HEMANGIOMAS - Benign normal skin lesions -  Benign-appearing - Call for any changes  NEVUS  3.0 mm medium brown macule  Left hand dorsum  - Benign appearing on exam today - Observation - Call clinic for new or changing moles - Recommend daily use of broad spectrum spf 30+ sunscreen to sun-exposed areas.   DSAP (disseminated superficial actinic porokeratosis) arms, legs multiple tan scaly macules with keratotic rim  Chronic condition with duration or expected duration over one year. Currently well-controlled.    Continue  Prescription [facility] Cholesterol-Lovastatin cream once or twice daily as needed.   Elastosis of skin Head - Anterior (Face)   Will prescribe [facility] Anti-Aging Tretinoin 0.5%/Niacinamide/Vitamin C/Vitamin E/Turmeric/Resveratrol with Hyaluronic Acid. Apply pea sized amount nightly to the entire face.  The patient was advised this is not covered by insurance since it is made by a compounding pharmacy. They will receive an email to check out and the medication will be mailed to their home.    Topical retinoid medications like tretinoin can cause dryness and irritation when first started. Only apply a pea-sized amount to the entire affected area. Avoid applying it around the eyes, edges of mouth and creases at the nose. If you experience irritation, use a good moisturizer first and/or apply the medicine less often. If you are doing well with the medicine, you can increase how often you use it until you are applying every night. Be careful with sun protection while using this medication as it can make you sensitive to the sun. This medicine should not be used by pregnant women.     HISTORY OF BASAL CELL CARCINOMA OF THE SKIN Back-removed and treated in [place] -  No evidence of recurrence today - Recommend regular full body skin exams - Recommend daily broad spectrum sunscreen SPF 30+ to sun-exposed areas, reapply every 2 hours as needed.  - Call if any new or changing lesions are noted between  office visits   INFLAMED SEBORRHEIC KERATOSIS (3) left clavicle x 1, left knee x 1, left mandible x 1 (3) Symptomatic, irritating, patient would like treated.  Destruction of lesion - left clavicle x 1, left knee x 1, left mandible x 1 (3)  Destruction method: cryotherapy   Informed consent: discussed and consent obtained   Lesion destroyed using liquid nitrogen: Yes   Region frozen until ice ball extended beyond lesion: Yes   Outcome: patient tolerated procedure well with no complications   Post-procedure details: wound care instructions given   Additional details:  Prior to procedure, discussed risks of blister formation, small wound, skin dyspigmentation, or rare scar following cryotherapy. Recommend Vaseline ointment to treated areas while healing.    Return in about 1 year (around 05/18/2024) for TBSE, hx of BCC.  I, Fay Kirks, CMA, am acting as scribe for Rexene Rattler, MD .   Documentation: I have reviewed the above documentation for accuracy and completeness, and I agree with the above.  Rexene Rattler, MD

## 2023-05-19 NOTE — Patient Instructions (Addendum)

## 2023-07-12 ENCOUNTER — Other Ambulatory Visit: Payer: Self-pay | Admitting: Internal Medicine

## 2023-08-27 ENCOUNTER — Other Ambulatory Visit: Payer: Self-pay | Admitting: Obstetrics and Gynecology

## 2023-08-28 NOTE — Telephone Encounter (Signed)
 Medication refill request: estradiol  0.1mg /gm cream  Last AEX:  12/25/22 Next AEX: 12/29/23 Last MMG (if hormonal medication request): 02/03/23 Bi-rads 1 neg  Refill authorized: call placed to patient to verify that she needs refill. Patient states she does need the refill.

## 2023-09-24 ENCOUNTER — Other Ambulatory Visit: Payer: Self-pay | Admitting: Internal Medicine

## 2023-09-24 NOTE — Telephone Encounter (Signed)
 Copied from CRM [id number]. Topic: Clinical - Medication Refill >> Sep 24, 2023  3:49 PM Bridgette Campus T wrote: Medication: pantoprazole  (PROTONIX ) 40 MG tablet   Has the patient contacted their pharmacy? No (Agent: If no, request that the patient contact the pharmacy for the refill. If patient does not wish to contact the pharmacy document the reason why and proceed with request.) (Agent: If yes, when and what did the pharmacy advise?)  This is the patient's preferred pharmacy:  [id number] [place] [facility] - [address] [place] [address] Phone: [phone] Fax: [phone]    Is this the correct pharmacy for this prescription? Yes If no, delete pharmacy and type the correct one.   Has the prescription been filled recently? Yes  Is the patient out of the medication? No  Has the patient been seen for an appointment in the last year OR does the patient have an upcoming appointment? Yes  Can we respond through MyChart? Yes  Agent: Please be advised that Rx refills may take up to 3 business days. We ask that you follow-up with your pharmacy.

## 2023-09-25 MED ORDER — PANTOPRAZOLE SODIUM 40 MG PO TBEC: 40.0000 mg | DELAYED_RELEASE_TABLET | Freq: Every day | ORAL | 1 refills | Status: DC

## 2023-12-22 ENCOUNTER — Other Ambulatory Visit: Payer: Self-pay | Admitting: Obstetrics and Gynecology

## 2023-12-22 DIAGNOSIS — Z1231 Encounter for screening mammogram for malignant neoplasm of breast: Secondary | ICD-10-CM

## 2023-12-25 NOTE — Progress Notes (Unsigned)
 58 y.o. G29P2002 Married Caucasian female here for annual exam. Would like to discuss getting lab work done.    Followed for vaginal atrophy, prolapse and pessary, and overactive bladder.  Pessary is helpful.  Voiding better with pessary in. Gemtesa  has resolved urgency for patient.  Using vaginal estrogen cream.   States she is prediabetic.    She is interested to see a cardiologist for prevention of heart disease.  Caring for her parents are 95 and 75.  Mother has lymphoma.   They live in their home.  Patient is a caregiver.   PCP: Rollene Almarie LABOR, MD   Patient's last menstrual period was 05/06/2009.           Sexually active: No.  The current method of family planning is status post hysterectomy.    Menopausal hormone therapy:  Estrace  Exercising: Yes.    Walking and house work  Smoker:  no  OB History  Gravida Para Term Preterm AB Living  2 2 2   2   SAB IAB Ectopic Multiple Live Births          # Outcome Date GA Lbr Len/2nd Weight Sex Type Anes PTL Lv  2 Term           1 Term              HEALTH MAINTENANCE: Last 2 paps:  08-01-14 Neg:Neg HR HPV, 09/27/10 neg.  Hysterectomy 07/12/19 - benign cervix. History of abnormal Pap or positive HPV:  yes, 2006 hx of LEEP  Mammogram:   02/03/23 Breast Density Cat C, BIRADS Cat 1 neg.  Has appointment.   Colonoscopy:  07/16/17 Bone Density:  n/a  Result  n/a   Immunization History  Administered Date(s) Administered   DTaP 12/05/1967, 08/01/2014   Hep B, Unspecified 08/05/2006   Hepatitis B 08/05/2006   Hepatitis B, PED/ADOLESCENT 08/05/2006   Influenza Split 01/11/2013, 01/13/2015, 01/22/2016, 01/15/2017, 02/04/2018, 12/21/2018   Influenza Whole 02/07/2010   Influenza,inj,Quad PF,6+ Mos 01/11/2013, 01/13/2015, 01/22/2016, 01/15/2017, 02/04/2018, 12/21/2018, 12/21/2018, 04/06/2020, 03/14/2021, 01/14/2022   Influenza,inj,quad, With Preservative 01/11/2013, 01/13/2015, 01/22/2016, 01/15/2017, 02/04/2018, 12/21/2018    Influenza-Unspecified 01/04/2013, 03/12/2014, 02/04/2023   MMR 12/05/1967   PFIZER(Purple Top)SARS-COV-2 Vaccination 07/03/2019, 07/24/2019   Td 06/30/2003   Tdap 12/05/1967, 08/01/2014   Zoster Recombinant(Shingrix) 12/21/2018, 02/22/2019   Zoster, Unspecified 12/21/2018, 02/22/2019      reports that she has never smoked. She has never used smokeless tobacco. She reports that she does not drink alcohol and does not use drugs.  Past Medical History:  Diagnosis Date   Acne    Allergy    allergic rhinitis   Anxiety    Bladder stone 2018   Depression    Elevated hemoglobin A1c 2017   5.9   GERD (gastroesophageal reflux disease)    Hyperlipidemia    Kidney stone    Low serum vitamin D  2017   15   Skin cancer 2009   possible BCC tx's in [place] at back > 15 years ago    Past Surgical History:  Procedure Laterality Date   ABDOMINAL HYSTERECTOMY  07/11/2009    laparosccopically assisted vaginal hysterectomy with anterior and posterior colporrhaphy, transobturator Monarch sling, cystoscopy/ovaries remain   APPENDECTOMY     BLADDER SURGERY  07/2007   cystocele, pessary   bladder tack     2nd bladder surgery   CHOLECYSTECTOMY  2015   COLLAGEN INJECTION  07/07/2008   Periurethral injection - Dr. Gaston   CYST REMOVAL HAND  EXTRACORPOREAL SHOCK WAVE LITHOTRIPSY Right 08/06/2018   Procedure: EXTRACORPOREAL SHOCK WAVE LITHOTRIPSY (ESWL);  Surgeon: Francisca Redell BROCKS, MD;  Location: [facility] ORS;  Service: Urology;  Laterality: Right;   INCONTINENCE SURGERY  08/04/2007   Spark miduethral sling, cystoscopy - Dr. Gaston   NASAL SINUS SURGERY  09/2015   Dr.Byers    Current Outpatient Medications  Medication Sig Dispense Refill   cholecalciferol (VITAMIN D3) 25 MCG (1000 UT) tablet Take 1,000 Units by mouth daily.     ciclopirox  (LOPROX ) 0.77 % cream Apply topically 2 (two) times daily. For 6 weeks 90 g 2   D-MANNOSE PO Take 1 tablet by mouth daily. For prevention of UTIs      Dextromethorphan-Bupropion  (AUVELITY PO) Take by mouth.     Esketamine HCl (SPRAVATO, 56 MG DOSE, NA) Place into the nose.     estradiol  (ESTRACE ) 0.1 MG/GM vaginal cream PLACE ONE HALF (1/2) G VAGINALLY TWO OR THREE TIMES PER WEEK AS NEEDED TO MAINTAIN SYMPTOM RELIEF. 42.5 g 0   ibuprofen  (ADVIL ) 800 MG tablet TAKE 1 TABLET BY MOUTH EVERY 8 HOURS AS NEEDED 90 tablet 1   Multiple Vitamins-Minerals (MULTIVITAMIN WOMEN PO) Take 1 tablet by mouth daily.     ondansetron  (ZOFRAN -ODT) 8 MG disintegrating tablet Take 8 mg by mouth daily as needed.     pantoprazole  (PROTONIX ) 40 MG tablet Take 1 tablet (40 mg total) by mouth daily. 90 tablet 1   sulfamethoxazole -trimethoprim  (BACTRIM  DS) 800-160 MG tablet Take 1 tablet by mouth as needed.     Vibegron  (GEMTESA ) 75 MG TABS TAKE 1 TABLET (75 MG) BY MOUTH DAILY. 90 tablet 2   nitrofurantoin  (MACRODANTIN ) 50 MG capsule Take 50 mg by mouth as needed. (Patient not taking: Reported on 12/29/2023)     No current facility-administered medications for this visit.    ALLERGIES: Patient has no known allergies.  Family History  Problem Relation Age of Onset   Diabetes Father    Diabetes Maternal Grandmother    Uterine cancer Paternal Aunt    Breast cancer Neg Hx    Colon cancer Neg Hx     Review of Systems  All other systems reviewed and are negative.   PHYSICAL EXAM:  BP 116/82 (BP Location: Left Arm, Patient Position: Sitting)   Pulse 77   Ht 5' 3 (1.6 m)   Wt 138 lb (62.6 kg)   LMP 05/06/2009   SpO2 98%   BMI 24.45 kg/m     General appearance: alert, cooperative and appears stated age Head: normocephalic, without obvious abnormality, atraumatic Neck: no adenopathy, supple, symmetrical, trachea midline and thyroid  normal to inspection and palpation Lungs: clear to auscultation bilaterally Breasts: normal appearance, no masses or tenderness, No nipple retraction or dimpling, No nipple discharge or bleeding, No axillary adenopathy Heart:  regular rate and rhythm Abdomen: soft, non-tender; no masses, no organomegaly Extremities: extremities normal, atraumatic, no cyanosis or edema Skin: skin color, texture, turgor normal. No rashes or lesions Lymph nodes: cervical, supraclavicular, and axillary nodes normal. Neurologic: grossly normal  Pelvic: External genitalia:  no lesions              No abnormal inguinal nodes palpated.              Urethra:  normal appearing urethra with no masses, tenderness or lesions              Bartholins and Skenes: normal  Vagina: normal appearing vagina with normal color and discharge, no lesions              Cervix: no lesions              Pap taken: {yes no:314532} Bimanual Exam:  Uterus:  normal size, contour, position, consistency, mobility, non-tender              Adnexa: no mass, fullness, tenderness              Rectal exam: {yes no:314532}.  Confirms.              Anus:  normal sphincter tone, no lesions  Chaperone was present for exam:  Kari HERO, CMA  ASSESSMENT: Well woman visit with gynecologic exam. Status post TAH.  Ovaries remain.  Hx LEEP.   Cystocele. Treated with pessary.  Pessary maintenance. Vaginal atrophy.  Improved with vaginal estrogen cream.  Overactive bladder.  On Gemtesa .  Hx recurrent UTIs.  Renal stones.  Hx elevated hemoglobin A1C.    Hx hyperlipidemia. Hx depression and anxiety.  Irritable bowel.  Maternal aunt with ovarian cancer.   PHQ-2-9: 0  ***  PLAN: Mammogram screening discussed. Self breast awareness reviewed. Pap and HRV collected:  yes Guidelines for Calcium, Vitamin D , regular exercise program including cardiovascular and weight bearing exercise. Medication refills:  *** Routine labs. Follow up:  ***    Additional counseling given.  {yes [id number]. ***  total time was spent for this patient encounter, including preparation, face-to-face counseling with the patient, coordination of care, and documentation of the  encounter in addition to doing the well woman visit with gynecologic exam.

## 2023-12-29 ENCOUNTER — Ambulatory Visit (INDEPENDENT_AMBULATORY_CARE_PROVIDER_SITE_OTHER): Payer: BC Managed Care – PPO | Admitting: Obstetrics and Gynecology

## 2023-12-29 ENCOUNTER — Encounter: Payer: Self-pay | Admitting: Obstetrics and Gynecology

## 2023-12-29 ENCOUNTER — Other Ambulatory Visit (HOSPITAL_COMMUNITY)
Admission: RE | Admit: 2023-12-29 | Discharge: 2023-12-29 | Disposition: A | Discharge status: Home / Self Care | Source: Ambulatory Visit | Attending: Obstetrics and Gynecology | Admitting: Obstetrics and Gynecology

## 2023-12-29 VITALS — BP 116/82 | HR 77 | Ht 63.0 in | Wt 138.0 lb

## 2023-12-29 DIAGNOSIS — Z87410 Personal history of cervical dysplasia: Secondary | ICD-10-CM | POA: Diagnosis not present

## 2023-12-29 DIAGNOSIS — N3281 Overactive bladder: Secondary | ICD-10-CM | POA: Diagnosis not present

## 2023-12-29 DIAGNOSIS — Z01419 Encounter for gynecological examination (general) (routine) without abnormal findings: Secondary | ICD-10-CM | POA: Diagnosis not present

## 2023-12-29 DIAGNOSIS — Z5181 Encounter for therapeutic drug level monitoring: Secondary | ICD-10-CM

## 2023-12-29 DIAGNOSIS — N952 Postmenopausal atrophic vaginitis: Secondary | ICD-10-CM

## 2023-12-29 DIAGNOSIS — Z1272 Encounter for screening for malignant neoplasm of vagina: Secondary | ICD-10-CM

## 2023-12-29 DIAGNOSIS — Z0000 Encounter for general adult medical examination without abnormal findings: Secondary | ICD-10-CM

## 2023-12-29 DIAGNOSIS — Z1331 Encounter for screening for depression: Secondary | ICD-10-CM | POA: Diagnosis not present

## 2023-12-29 DIAGNOSIS — Z4689 Encounter for fitting and adjustment of other specified devices: Secondary | ICD-10-CM | POA: Diagnosis not present

## 2023-12-29 MED ORDER — GEMTESA 75 MG PO TABS: 75.0000 mg | ORAL_TABLET | Freq: Every day | ORAL | 3 refills | Status: AC

## 2023-12-29 MED ORDER — ESTRADIOL 0.1 MG/GM VA CREA: TOPICAL_CREAM | VAGINAL | 2 refills | Status: AC

## 2023-12-29 NOTE — Patient Instructions (Signed)

## 2023-12-30 ENCOUNTER — Ambulatory Visit: Payer: Self-pay | Admitting: Obstetrics and Gynecology

## 2023-12-30 LAB — HEMOGLOBIN A1C
Hgb A1c MFr Bld: 5.9 % — ABNORMAL HIGH (ref <5.7)
Mean Plasma Glucose: 123 mg/dL
eAG (mmol/L): 6.8 mmol/L

## 2023-12-30 LAB — COMPREHENSIVE METABOLIC PANEL WITH GFR
AG Ratio: 2 (calc) (ref 1.0–2.5)
ALT: 16 U/L (ref 6–29)
AST: 16 U/L (ref 10–35)
Albumin: 4.3 g/dL (ref 3.6–5.1)
Alkaline phosphatase (APISO): 87 U/L (ref 37–153)
BUN: 13 mg/dL (ref 7–25)
CO2: 30 mmol/L (ref 20–32)
Calcium: 9.3 mg/dL (ref 8.6–10.4)
Chloride: 104 mmol/L (ref 98–110)
Creat: 0.86 mg/dL (ref 0.50–1.03)
Globulin: 2.1 g/dL (ref 1.9–3.7)
Glucose, Bld: 92 mg/dL (ref 65–99)
Potassium: 3.7 mmol/L (ref 3.5–5.3)
Sodium: 141 mmol/L (ref 135–146)
Total Bilirubin: 0.3 mg/dL (ref 0.2–1.2)
Total Protein: 6.4 g/dL (ref 6.1–8.1)
eGFR: 78 mL/min/1.73m2 (ref >=60)

## 2023-12-30 LAB — LIPID PANEL
Cholesterol: 210 mg/dL — ABNORMAL HIGH (ref <200)
HDL: 49 mg/dL — ABNORMAL LOW (ref >=50)
LDL Cholesterol (Calc): 121 mg/dL — ABNORMAL HIGH
Non-HDL Cholesterol (Calc): 161 mg/dL — ABNORMAL HIGH (ref <130)
Total CHOL/HDL Ratio: 4.3 (calc) (ref <5.0)
Triglycerides: 247 mg/dL — ABNORMAL HIGH (ref <150)

## 2023-12-30 LAB — TSH: TSH: 0.74 m[IU]/L (ref 0.40–4.50)

## 2023-12-30 LAB — CBC
HCT: 39.2 % (ref 35.0–45.0)
Hemoglobin: 12.7 g/dL (ref 11.7–15.5)
MCH: 30.2 pg (ref 27.0–33.0)
MCHC: 32.4 g/dL (ref 32.0–36.0)
MCV: 93.1 fL (ref 80.0–100.0)
MPV: 10.2 fL (ref 7.5–12.5)
Platelets: 259 Thousand/uL (ref 140–400)
RBC: 4.21 Million/uL (ref 3.80–5.10)
RDW: 12.5 % (ref 11.0–15.0)
WBC: 5.9 Thousand/uL (ref 3.8–10.8)

## 2023-12-30 LAB — CYTOLOGY - PAP
Comment: NEGATIVE
Diagnosis: NEGATIVE
High risk HPV: NEGATIVE

## 2023-12-30 LAB — VITAMIN D 25 HYDROXY (VIT D DEFICIENCY, FRACTURES): Vit D, 25-Hydroxy: 61 ng/mL (ref 30–100)

## 2024-01-13 ENCOUNTER — Encounter: Payer: Self-pay | Admitting: Internal Medicine

## 2024-01-13 ENCOUNTER — Ambulatory Visit: Admitting: Internal Medicine

## 2024-01-13 VITALS — BP 134/86 | HR 82 | Temp 98.5°F | Ht 63.0 in | Wt 139.0 lb

## 2024-01-13 DIAGNOSIS — R7303 Prediabetes: Secondary | ICD-10-CM | POA: Diagnosis not present

## 2024-01-13 DIAGNOSIS — K582 Mixed irritable bowel syndrome: Secondary | ICD-10-CM | POA: Diagnosis not present

## 2024-01-13 DIAGNOSIS — E782 Mixed hyperlipidemia: Secondary | ICD-10-CM | POA: Diagnosis not present

## 2024-01-13 DIAGNOSIS — E7800 Pure hypercholesterolemia, unspecified: Secondary | ICD-10-CM

## 2024-01-13 DIAGNOSIS — Z23 Encounter for immunization: Secondary | ICD-10-CM

## 2024-01-13 DIAGNOSIS — Z6824 Body mass index (BMI) 24.0-24.9, adult: Secondary | ICD-10-CM

## 2024-01-13 MED ORDER — LINACLOTIDE 145 MCG PO CAPS: 145.0000 ug | ORAL_CAPSULE | Freq: Every day | ORAL | 3 refills | Status: DC

## 2024-01-13 NOTE — Patient Instructions (Addendum)
 We have sent in linzess  to take 1 pill daily and let us  know in 2 weeks if working well.  We will get the calcium score.

## 2024-01-13 NOTE — Progress Notes (Signed)
 Subjective:   Patient ID: Sheila [name], female    DOB: 04-27-1966, 58 y.o.   MRN: [id number]  Discussed the use of AI scribe software for clinical note transcription with the patient, who gave verbal consent to proceed.  History of Present Illness Sheila [name] is a 58 year old female with IBS who presents with worsening constipation impacting her daily life.  She has a long-standing history of IBS, diagnosed in her twenties, which has progressively worsened over the past year. Her current symptoms include severe constipation, described as 'getting corked up,' leading to significant discomfort and impacting her ability to engage in daily activities such as dining out and socializing with family. She reports alternating periods of constipation and diarrhea, with diarrhea occurring particularly after taking laxatives.  She has tried various over-the-counter treatments including Colace, Dulcolax, and fiber supplements, but reports limited success. Dulcolax previously helped but now results in prolonged diarrhea without effectively relieving her constipation. She also reports a sensation of pressure in her colon, which she attributes to being 'stopped up.'  Previous diagnostic evaluations, including an MRI and two colonoscopies, did not reveal any structural abnormalities. She finds the preparation required for colonoscopies, particularly the large volume prep, intolerable.  Her family history includes a predisposition to constipation, referred to as the 'Maynard stomach,' affecting her parents who are in their nineties. She is concerned about missing out on family activities and social events due to her bowel issues.  She is currently managing stress related to caring for her elderly parents and anticipates additional responsibilities with the upcoming birth of her first grandchild.  Review of Systems  Constitutional: Negative.   HENT: Negative.    Eyes: Negative.   Respiratory:   Negative for cough, chest tightness and shortness of breath.   Cardiovascular:  Negative for chest pain, palpitations and leg swelling.  Gastrointestinal:  Positive for abdominal pain and constipation. Negative for abdominal distention, diarrhea, nausea and vomiting.  Musculoskeletal: Negative.   Skin: Negative.   Neurological: Negative.   Psychiatric/Behavioral: Negative.      Objective:  Physical Exam Constitutional:      Appearance: She is well-developed.  HENT:     Head: Normocephalic and atraumatic.  Cardiovascular:     Rate and Rhythm: Normal rate and regular rhythm.  Pulmonary:     Effort: Pulmonary effort is normal. No respiratory distress.     Breath sounds: Normal breath sounds. No wheezing or rales.  Abdominal:     General: Bowel sounds are normal. There is no distension.     Palpations: Abdomen is soft.     Tenderness: There is no abdominal tenderness.  Musculoskeletal:     Cervical back: Normal range of motion.  Skin:    General: Skin is warm and dry.  Neurological:     Mental Status: She is alert and oriented to person, place, and time.     Coordination: Coordination normal.     Vitals:   01/13/24 1415  BP: 134/86  Pulse: 82  Temp: 98.5 F (36.9 C)  TempSrc: Oral  SpO2: 99%  Weight: 139 lb (63 kg)  Height: 5' 3 (1.6 m)    Assessment and Plan Assessment & Plan Chronic constipation (IBS-C)   She experiences chronic constipation with IBS-C, unresponsive to previous treatments, and MRI shows no structural abnormalities. Linzess  was discussed for symptom management. Prescribe Linzess  starting with the medium dose and adjust based on response.  Prediabetes   Her prediabetes shows an A1c of 5.9%, improved  from 6.0%, with a family history of diabetes. Lifestyle modifications were discussed to reduce A1c. Encourage regular exercise and dietary modifications focusing on reducing sugars and carbohydrates.  Hyperlipidemia   Her hyperlipidemia presents with an  LDL of 121 mg/dL, which is acceptable, and elevated triglycerides at 247 mg/dL, non-fasting. No immediate medication is needed.  Normal weight   Her BMI is 24.6, within the normal range. Discussed natural weight gain with aging and menopause, and the benefits of maintaining a slightly higher BMI in older age.  General Health Maintenance   Discussed flu vaccination timing and benefits of early administration. Administer the flu shot today

## 2024-01-15 ENCOUNTER — Encounter: Payer: Self-pay | Admitting: Internal Medicine

## 2024-01-15 DIAGNOSIS — Z6824 Body mass index (BMI) 24.0-24.9, adult: Secondary | ICD-10-CM | POA: Insufficient documentation

## 2024-01-15 NOTE — Assessment & Plan Note (Signed)
 Her BMI is 24.6, within the normal range. Discussed natural weight gain with aging and menopause, and the benefits of maintaining a slightly higher BMI in older age.

## 2024-01-15 NOTE — Assessment & Plan Note (Signed)
 Her prediabetes shows an A1c of 5.9%, improved from 6.0%, with a family history of diabetes. Lifestyle modifications were discussed to reduce A1c. Encourage regular exercise and dietary modifications focusing on reducing sugars and carbohydrates.

## 2024-01-15 NOTE — Assessment & Plan Note (Signed)
 She experiences chronic constipation with IBS-C, unresponsive to previous treatments, and MRI shows no structural abnormalities. Linzess  was discussed for symptom management. Prescribe Linzess  starting with the medium dose and adjust based on response.

## 2024-01-15 NOTE — Assessment & Plan Note (Signed)
 Her hyperlipidemia presents with an LDL of 121 mg/dL, which is acceptable, and elevated triglycerides at 247 mg/dL, non-fasting. No immediate medication is needed.

## 2024-01-23 ENCOUNTER — Ambulatory Visit
Admission: RE | Admit: 2024-01-23 | Discharge: 2024-01-23 | Disposition: A | Discharge status: Home / Self Care | Payer: Self-pay | Source: Ambulatory Visit | Attending: Internal Medicine | Admitting: Internal Medicine

## 2024-01-23 DIAGNOSIS — E782 Mixed hyperlipidemia: Secondary | ICD-10-CM | POA: Insufficient documentation

## 2024-01-26 ENCOUNTER — Telehealth: Payer: Self-pay

## 2024-01-26 NOTE — Telephone Encounter (Signed)
 PA was done via CoverMyMeds for Gemtesa . PA was denied for the following reasons:  Your plan only covers this drug if you have tried at least one generic urinary antispasmodic drug, and it did not work well for you. We have denied your request because: A) You have not tried it, and B) You do not have a medical reason not to take it. Generic Urinary Antispasmodic Drug include: Oxybutynin Tolterodine Solifenacin  Flavoxate Mirabegron  Trospium  Patient was previously prescribed Mirabegron  under previous insurance however medication was $141.00 with insurance and without insurance $1500 anMyrbetriq  $140 for 90 day supply so patient did not receive it  Routing to provider for review and to receive next steps.  Key: [id number]

## 2024-01-26 NOTE — Telephone Encounter (Signed)
 Patient has already tried Solifenacin  in 2016 and Myrbetriq  starting in 2019.   They have not controlled her symptoms as well as Gemtesa .

## 2024-01-27 ENCOUNTER — Ambulatory Visit: Payer: Self-pay | Admitting: Internal Medicine

## 2024-02-04 ENCOUNTER — Ambulatory Visit
Admission: RE | Admit: 2024-02-04 | Discharge: 2024-02-04 | Disposition: A | Discharge status: Home / Self Care | Source: Ambulatory Visit | Attending: Obstetrics and Gynecology | Admitting: Obstetrics and Gynecology

## 2024-02-04 DIAGNOSIS — Z1231 Encounter for screening mammogram for malignant neoplasm of breast: Secondary | ICD-10-CM | POA: Insufficient documentation

## 2024-02-06 NOTE — Telephone Encounter (Signed)
 Pt called the triage line and LM, she got a letter about the Gemtesa  being denied. She states that she will try a generic brand if Dr. Nikki feels that,that is appropriate for her.    Please advise pt would like a call back about what the next steps will be.

## 2024-02-08 NOTE — Telephone Encounter (Signed)
 Patient has already tried two medications on the list of required meds in order for the Gemtesa  to be approved.   Please communicate this to CoverMyMeds and to the patient so she has an update.

## 2024-02-09 ENCOUNTER — Ambulatory Visit: Payer: Self-pay | Admitting: Obstetrics and Gynecology

## 2024-02-11 ENCOUNTER — Other Ambulatory Visit: Payer: Self-pay | Admitting: Internal Medicine

## 2024-02-11 NOTE — Telephone Encounter (Signed)
 Pt called because she is doing good with rx (double-dose) and is requesting new rx with higher dosage as discussed with pcp

## 2024-02-11 NOTE — Telephone Encounter (Unsigned)
 Copied from CRM [id number]. Topic: Clinical - Medication Refill >> Feb 11, 2024 11:24 AM Alfonso ORN wrote:  Pt called because she is doing good with rx (double-dose) and is requesting new rx with higher dosage as discussed with pcp  Medication: linaclotide  (LINZESS ) 145 MCG CAPS capsule [[id number]]   Has the patient contacted their pharmacy? Yes (Agent: If no, request that the patient contact the pharmacy for the refill. If patient does not wish to contact the pharmacy document the reason why and proceed with request.) (Agent: If yes, when and what did the pharmacy advise?)  This is the patient's preferred pharmacy:  [id number] - [address] [place] [address] Phone: [phone] Fax: [phone]   Is this the correct pharmacy for this prescription? Yes If no, delete pharmacy and type the correct one.   Has the prescription been filled recently? No  Is the patient out of the medication? No  Has the patient been seen for an appointment in the last year OR does the patient have an upcoming appointment? Yes  Can we respond through MyChart? Yes

## 2024-02-12 NOTE — Telephone Encounter (Signed)
 Sheila [name]-has an updated PA been submitted with tried and failed medications?

## 2024-02-12 NOTE — Telephone Encounter (Signed)
 New PA request was created with additional supporting information and approved by patients insurance. Patient has been called and made aware. Told her that her pharmacy should be contacting her soon to pick up her prescription.   Key: [id number]

## 2024-02-12 NOTE — Telephone Encounter (Signed)
 See 01/26/24 telephone encounter.   Encounter closed.

## 2024-02-12 NOTE — Telephone Encounter (Signed)
 Reviewed and closed.

## 2024-02-13 ENCOUNTER — Encounter: Admitting: Internal Medicine

## 2024-02-13 MED ORDER — LINACLOTIDE 145 MCG PO CAPS: 145.0000 ug | ORAL_CAPSULE | Freq: Every day | ORAL | 3 refills | Status: AC

## 2024-02-17 ENCOUNTER — Encounter: Payer: Self-pay | Admitting: Internal Medicine

## 2024-02-17 ENCOUNTER — Ambulatory Visit (INDEPENDENT_AMBULATORY_CARE_PROVIDER_SITE_OTHER): Admitting: Internal Medicine

## 2024-02-17 VITALS — BP 100/68 | HR 71 | Temp 98.8°F | Ht 63.0 in | Wt 137.0 lb

## 2024-02-17 DIAGNOSIS — R7303 Prediabetes: Secondary | ICD-10-CM

## 2024-02-17 DIAGNOSIS — E7800 Pure hypercholesterolemia, unspecified: Secondary | ICD-10-CM

## 2024-02-17 DIAGNOSIS — Z0000 Encounter for general adult medical examination without abnormal findings: Secondary | ICD-10-CM | POA: Diagnosis not present

## 2024-02-17 DIAGNOSIS — Z23 Encounter for immunization: Secondary | ICD-10-CM | POA: Diagnosis not present

## 2024-02-17 MED ORDER — IBUPROFEN 800 MG PO TABS: 800.0000 mg | ORAL_TABLET | Freq: Three times a day (TID) | ORAL | 1 refills | Status: DC | PRN

## 2024-02-17 MED ORDER — PANTOPRAZOLE SODIUM 40 MG PO TBEC: 40.0000 mg | DELAYED_RELEASE_TABLET | Freq: Every day | ORAL | 3 refills | Status: AC

## 2024-02-17 NOTE — Patient Instructions (Signed)
 We will have you give the linzess  1-2 weeks. If not working well we can double the dose to 290 daily.

## 2024-02-17 NOTE — Progress Notes (Signed)
 i  Subjective:   Patient ID: Sheila [name], female    DOB: [birth year], 58 y.o.   MRN: [id number]  The patient is here for physical. Pertinent topics discussed: Discussed the use of AI scribe software for clinical note transcription with the patient, who gave verbal consent to proceed.  History of Present Illness Sheila [name] is a 58 year old female who presents for follow-up   She is currently taking a high dose of Linzess  for constipation. Her appetite has decreased, which she suspects might be a side effect. Although her bowel movements are easier, they are not as frequent as desired. She started the higher dose a day or two ago and plans to give it a week or two to assess its effectiveness. Previously, the medium dose provided some relief. No fever, chills, or flu-like symptoms after her recent flu shot.  She has been taking Gemtesa  for over a year for bladder issues, including urgency and incontinence, but is experiencing issues with insurance coverage for this medication. She has a history of two bladder sling surgeries, with the second one being successful. 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   PMH, [facility], social history reviewed and updated  Review of Systems  Constitutional: Negative.   HENT: Negative.    Eyes: Negative.   Respiratory:  Negative for cough, chest tightness and shortness of breath.   Cardiovascular:  Negative for chest pain, palpitations and leg swelling.  Gastrointestinal:  Negative for abdominal distention, abdominal pain, constipation, diarrhea, nausea and vomiting.  Musculoskeletal: Negative.   Skin: Negative.   Neurological: Negative.   Psychiatric/Behavioral: Negative.      Objective:  Physical Exam Constitutional:      Appearance: She is well-developed.  HENT:     Head: Normocephalic and atraumatic.  Cardiovascular:     Rate and Rhythm: Normal rate and regular rhythm.   Pulmonary:     Effort: Pulmonary effort is normal. No respiratory distress.     Breath sounds: Normal breath sounds. No wheezing or rales.  Abdominal:     General: Bowel sounds are normal. There is no distension.     Palpations: Abdomen is soft.     Tenderness: There is no abdominal tenderness.  Musculoskeletal:     Cervical back: Normal range of motion.  Skin:    General: Skin is warm and dry.  Neurological:     Mental Status: She is alert and oriented to person, place, and time.     Coordination: Coordination normal.     Vitals:   02/17/24 1322  BP: 100/68  Pulse: 71  Temp: 98.8 F (37.1 C)  TempSrc: Oral  SpO2: 96%  Weight: 137 lb (62.1 kg)  Height: 5' 3 (1.6 m)    Assessment & Plan:  Prevnar 20 given at visit

## 2024-02-18 NOTE — Assessment & Plan Note (Signed)
Recent labs reviewed.

## 2024-02-18 NOTE — Assessment & Plan Note (Signed)
 Recent HgA1c reviewed.

## 2024-02-18 NOTE — Assessment & Plan Note (Signed)
 Flu shot up to date. Pneumonia given 20. Shingrix complete. Tetanus up to date. Colonoscopy up to date. Mammogram up to date, pap smear up to date. Counseled about sun safety and mole surveillance. Counseled about the dangers of distracted driving. Given 10 year screening recommendations.

## 2024-04-17 ENCOUNTER — Other Ambulatory Visit: Payer: Self-pay | Admitting: Internal Medicine

## 2024-05-18 ENCOUNTER — Encounter: Payer: 59 | Admitting: Dermatology

## 2024-07-05 ENCOUNTER — Encounter: Admitting: Dermatology

## 2024-12-29 ENCOUNTER — Ambulatory Visit: Admitting: Obstetrics and Gynecology

## 2025-02-18 ENCOUNTER — Encounter: Admitting: Internal Medicine
